# Patient Record
Sex: Male | Born: 1935
Health system: Southern US, Community
[De-identification: ages and names within clinical notes are randomized; demographics above are authoritative.]

## PROBLEM LIST (undated history)

## (undated) DIAGNOSIS — I2510 Atherosclerotic heart disease of native coronary artery without angina pectoris: Secondary | ICD-10-CM

## (undated) DIAGNOSIS — M109 Gout, unspecified: Secondary | ICD-10-CM

## (undated) DIAGNOSIS — E119 Type 2 diabetes mellitus without complications: Secondary | ICD-10-CM

## (undated) DIAGNOSIS — E785 Hyperlipidemia, unspecified: Secondary | ICD-10-CM

## (undated) DIAGNOSIS — I509 Heart failure, unspecified: Secondary | ICD-10-CM

## (undated) DIAGNOSIS — I10 Essential (primary) hypertension: Secondary | ICD-10-CM

## (undated) HISTORY — PX: BACK SURGERY: SHX140

## (undated) HISTORY — DX: Essential (primary) hypertension: I10

## (undated) HISTORY — DX: Gout, unspecified: M10.9

## (undated) HISTORY — DX: Atherosclerotic heart disease of native coronary artery without angina pectoris: I25.10

## (undated) HISTORY — DX: Morbid (severe) obesity due to excess calories: E66.01

## (undated) HISTORY — DX: Heart failure, unspecified: I50.9

## (undated) HISTORY — PX: PROSTATE SURGERY: SHX751

## (undated) HISTORY — DX: Hyperlipidemia, unspecified: E78.5

## (undated) HISTORY — DX: Type 2 diabetes mellitus without complications: E11.9

## (undated) HISTORY — PX: PERICARDIOCENTESIS: SHX2215

---

## 1997-08-15 ENCOUNTER — Ambulatory Visit (HOSPITAL_COMMUNITY): Admission: RE | Admit: 1997-08-15 | Discharge: 1997-08-15 | Payer: Self-pay | Admitting: Urology

## 1997-12-12 ENCOUNTER — Ambulatory Visit (HOSPITAL_COMMUNITY): Admission: RE | Admit: 1997-12-12 | Discharge: 1997-12-12 | Payer: Self-pay | Admitting: Urology

## 2000-12-26 ENCOUNTER — Encounter: Payer: Self-pay | Admitting: Cardiology

## 2000-12-26 ENCOUNTER — Ambulatory Visit (HOSPITAL_COMMUNITY): Admission: RE | Admit: 2000-12-26 | Discharge: 2000-12-27 | Payer: Self-pay | Admitting: Cardiology

## 2000-12-26 HISTORY — PX: CORONARY ANGIOPLASTY WITH STENT PLACEMENT: SHX49

## 2005-06-15 ENCOUNTER — Encounter (INDEPENDENT_AMBULATORY_CARE_PROVIDER_SITE_OTHER): Payer: Self-pay | Admitting: Specialist

## 2005-06-15 ENCOUNTER — Ambulatory Visit (HOSPITAL_COMMUNITY): Admission: RE | Admit: 2005-06-15 | Discharge: 2005-06-15 | Payer: Self-pay | Admitting: *Deleted

## 2006-04-14 ENCOUNTER — Encounter: Admission: RE | Admit: 2006-04-14 | Discharge: 2006-04-14 | Payer: Self-pay | Admitting: Internal Medicine

## 2007-06-09 ENCOUNTER — Ambulatory Visit (HOSPITAL_COMMUNITY): Admission: RE | Admit: 2007-06-09 | Discharge: 2007-06-09 | Payer: Self-pay | Admitting: Orthopedic Surgery

## 2007-06-14 ENCOUNTER — Encounter: Admission: RE | Admit: 2007-06-14 | Discharge: 2007-06-14 | Payer: Self-pay | Admitting: Orthopedic Surgery

## 2007-07-20 ENCOUNTER — Inpatient Hospital Stay (HOSPITAL_COMMUNITY): Admission: RE | Admit: 2007-07-20 | Discharge: 2007-07-22 | Payer: Self-pay | Admitting: Orthopedic Surgery

## 2010-08-31 HISTORY — PX: NM MYOVIEW LTD: HXRAD82

## 2010-08-31 HISTORY — PX: US ECHOCARDIOGRAPHY: HXRAD669

## 2010-09-29 NOTE — Op Note (Signed)
Roger Blackwell, Roger Blackwell NO.:  [id number]   MEDICAL RECORD NO.:  [id number]          PATIENT TYPE:  OIB   LOCATION:  0098                         FACILITY:  [facility]   PHYSICIAN:  Alvy Beal, MD    DATE OF BIRTH:  1935-06-07   DATE OF PROCEDURE:  07/20/2007  DATE OF DISCHARGE:                               OPERATIVE REPORT   PREOPERATIVE DIAGNOSIS:  Lumbar disk herniation, L5-S1 left side.   POSTOPERATIVE DIAGNOSIS:  Lumbar disk herniation, L5-S1 left side.   OPERATIVE PROCEDURE:  Lumbar laminectomy for diskectomy.   HISTORY:  This is a very pleasant 75 year old gentleman with diabetes,  heart disease, high blood pressure and morbid obesity.  He presents to  my office with complaints of severe left leg pain and moderate back.  Radiographic and clinical analysis confirmed the diagnosis of the disk  herniation.  After discussing treatment options including risks,  benefit, and alternatives to surgery.  He decided to proceed with  surgery because of the severity of the leg.   ASSISTANT:  Jene Every, M.D.   OPERATIVE NOTE:  The patient is brought to the operating room, placed  supine on the operating table.  After successful induction of general  anesthesia and endotracheal intubation, TEDs, SCDs and Foley were  applied and the patient was turned prone onto a Wilson frame.     Once the back was then prepped and draped in standard fashion after  ensuring that all bony prominences were well-padded.  A generous  posterior midline incision was then made.  Sharp dissection was carried  out down to and through the abundant adipose tissue to expose the deep  fascia.  The deep fascia was sharply incised and I stripped the  paraspinal muscles to expose the L5-S1 intervertebral space.  I placed a  Penfield 4 beneath the lamina of L5 and took an x-ray.  At this point I  realized that I was at the S1 lamina and so I relocated the Leonard 4,  repeated the x-ray,  confirmed the L5-S1 space.   At this point I used a high-speed bur to perform a laminotomy of L5 and  completed with a 2 and 3 mm Kerrison.  I then reflected the ligamentum  flavum off the insertion of the S1 lamina and then used 2 mm Kerrison to  resect some of the S1 lamina.  At this point I mobilized the ligamentum  flavum which I noted to be quite thickened.  I then used 2 and 3 mm  Kerrison to resect the ligamentum flavum and exposed the underlying  thecal sac.   At this point time the microscope was prepped and draped and brought  into the field, using the microscope, I was able to mobilize the thecal  sac and nerve root medially and expose the disk herniation.  There was a  disk herniation noted in the posterior lateral gutter right at the level  of the disk fragment similar to what was seen on preoperative MRI.  The  annulus was then incised with a 15 blade scalpel and I used a  combination of  pituitary rongeurs, curettes (Epstein curettes) to resect  the disk fragment.  Once I had resected the fragments of disk material,  I then removed the D'Errico retractor and then palpated superiorly,  medially, inferiorly along the lateral recess and out the neural  foramen.  There was no undue tension on the nerve roots, no evidence of  any retained fragments.  At this point my partner Dr. Shelle Iron repeated the  procedure in order to confirm that there was no retained fragments.  Once were both satisfied that we had completed diskectomy and we  obtained an adequate amount of disk consistent with what we saw on the  MRI, I irrigated the wound copiously with normal saline and used bipolar  electrocautery to obtain hemostasis and maintained it with a thrombin-  soaked Gelfoam patty placed over the laminotomy site.  I then closed the  deep fascia with interrupted #1 Vicryl sutures and I did a running #1  Vicryl sutures for the adipose tissue and a second 2-0 Vicryl to the  subcutaneous and a 3-0  Monocryl for the skin.  Steri-Strips and dry  dressing were applied.  The patient was extubated, transferred to PACU  without incident.  At the end of the case all needle and sponge counts  were correct.      Alvy Beal, MD  Electronically Signed     DDB/MEDQ  D:  07/20/2007  T:  07/20/2007  Job:  [id number]

## 2010-10-02 NOTE — Cardiovascular Report (Signed)
[place]  Patient:    Roger Blackwell, Roger Blackwell                      MRN: [id number] Proc. Date: 12/26/00 Adm. Date:  [id number] Attending:  Silvestre Mesi CC:         Soyla Murphy. Renne Crigler, M.D.  Cardiac Catheterization Lab   Cardiac Catheterization  REFERRING PHYSICIAN:  Soyla Murphy. Renne Crigler, M.D.  PROCEDURE PERFORMED BY:  Aram Candela. Aleen Campi, M.D.  PROCEDURES: 1. Left heart catheterization. 2. Coronary cineangiography. 3. Left ventricular cineangiography. 4. Abdominal aortogram. 5. Angioplasty with primary stenting of the proximal circumflex. 6. Perclose of the right femoral artery.  INDICATION FOR PROCEDURES:  This 75 year old male has a history of hypertension, non-insulin-dependent diabetes mellitus, tobacco abuse and recently complained of dyspnea on exertion.  He was sent for stress testing with a Persantine Cardiolite study, and this was positive for myocardial ischemia in the area of his inferolateral myocardium.  He was then scheduled for cardiac catheterization and possible angioplasty.  DESCRIPTION OF PROCEDURE:  After signing an informed consent, the patient was premedicated with 50 mg of Benadryl intravenously and brought to the cardiac catheterization lab.  His right groin was prepped and draped in sterile fashion, and anesthetized locally with 1% lidocaine.  A 6-French introducer sheath was inserted percutaneously into the right femoral artery.  Then 6-French #4 Judkins coronary catheters were used to make injections into the coronary arteries.  A 6-French pigtail catheter was used to measure pressures in the left ventricle and aorta, and to make midstream injections into the left ventricle and abdominal aorta.  After noting a severe to critical stenosis in his proximal circumflex coronary artery, which is a large dominant vessel, we explained this finding to the patient.  With his consent we proceeded with elective angioplasty  procedure.  We selected a 6-French CSL 4.0 guide catheter, which was advanced to the root of aorta.  After engaging this catheter tip in the ostium of the left coronary artery, a standard length Hi-Torque Floppy guide wire was inserted through the guide catheter and advanced into the left coronary artery.  After mild difficulty it was advanced into the circumflex and across the proximal lesion. After sizing the diameter and length of the lesion, we selected a 4.0 x 15 mm Penta multilink stent deployment system, which after proper preparation was inserted over the guide wire and positioned within the lesion.  The stent was deployed with two inflations; the first at 12 atm for 42 sec and the second at 15 atm for 36 sec.  After the stent was deployed the deployment was removed and further injections into the left coronary artery showed an excellent angiographic result, with 0% residual lesion and no evidence of dissection or clot.  There was normal antegrade flow.  The patient tolerated the procedure well and no complications were noted.  At the end of the procedure the catheter and sheath were removed from the right femoral artery, and hemostasis was easily obtained with a Perclose closure system.  MEDICATIONS GIVEN: 1. Heparin 6000 units IV. 2. Integrilin per pharmacy protocol.  HEMODYNAMIC DATA: 1. Left ventricular pressure:  185/0-16. 2. Aortic pressure:  185/87 with a mean of 124. 3. Left ventricular ejection fraction:  Estimated at approximately 55-60%.  CINEANGIOGRAPHIC FINDINGS: 1. LEFT CORONARY ARTERY:  The ostium and left main appear normal. 2. LEFT ANTERIOR DESCENDING ARTERY:  Has minor irregularities throughout the    proximal and middle segment,  but there was no significant stenotic lesion.    There was normal antegrade flow into the distal segment.  The distal    segment wrapped around the apex, supplying part of the posterior descending    circulation. 3. CIRCUMFLEX  CORONARY ARTERY:  The proximal segment has a minor plaque,    followed by a focal concentric 80% stenosis.  The remainder of the    circumflex appears normal, and is a large dominant vessel supplying the    posterolateral circulation in the proximal portion of the posterior    descending circulation.  The obtuse marginal branches appear normal. 4. RIGHT CORONARY ARTERY:  A small, nondominant vessel, which supplies the    right ventricular circulation.  It appears to be normal without significant    plaque.  LEFT VENTRICULAR CINEANGIOGRAM:  The left ventricular chamber size, contractility and wall thickness appeared normal.  There was no area of significant hypokinesia.  The ejection fraction was estimated at approximately 55%.  ABDOMINAL AORTOGRAM:  The abdominal aorta has mild irregular plaque in its distal segment.  There is normal flow and the renal arteries appeared normal. The left renal artery has a high takeoff and is moderately tortuous.  ANGIOPLASTY CINEANGIOGRAPHY: Cineangiographs taken during the angioplasty procedure shows proper position of the guide wire and balloon stent deployment system.  Final injections showed an excellent angiographic result, with 0% residual lesion and normal antegrade flow.  There was no evidence for dissection or clot.  FINAL DIAGNOSES: 1. Single-vessel coronary artery disease, with an 80% proximal    circumflex lesion. 2. Normal left ventricular function. 3. Normal mitral and aortic valves. 4. Mild irregular plaque, distal abdominal aorta. 5. Normal renal arteries. 6. Successful stent placement in the proximal circumflex lesion. 7. Successful Perclose of the right femoral artery.  DISPOSITION:  Will monitor on the EAU and anticipate discharge in the a.m. Will continue the Integrilin drip for 12 hr. DD:  12/26/00 TD:  12/26/00 Job: [id number]

## 2010-10-02 NOTE — Op Note (Signed)
[name], DOME NO.:  [id number]   MEDICAL RECORD NO.:  [id number]          PATIENT TYPE:  AMB   LOCATION:  ENDO                         FACILITY:  [facility]   PHYSICIAN:  Georgiana Spinner, M.D.    DATE OF BIRTH:  02-Jul-1935   DATE OF PROCEDURE:  06/15/2005  DATE OF DISCHARGE:                                 OPERATIVE REPORT   PROCEDURE:  Upper endoscopy.   INDICATIONS:  GERD.   ANESTHESIA:  Demerol 50, Versed 5 mg.   PROCEDURE:  With the patient mildly sedated in the left lateral decubitus  position, the Olympus videoscope endoscope was inserted in the mouth and  passed under direct vision through the esophagus which appeared normal into  the stomach, fundus, body and antrum. The duodenal bulb and second portion  of the duodenum were visualized from this point. The endoscope was slowly  withdrawn taking circumferential views at the line of mucosa until the  endoscope had been pulled back into the stomach and placed in retroflexion  to view the stomach from below. The endoscope was straightened and withdrawn  taking circumferential views of the gastric and esophageal mucosa. The  patient's vital signs and pulse oximeter remained stable. The patient  tolerated the procedure well with no apparent complications.   FINDINGS:  Unremarkable exam.   PLAN:  Proceed to colonoscopy.           ______________________________  Georgiana Spinner, M.D.     GMO/MEDQ  D:  06/15/2005  T:  06/15/2005  Job:  [id number]

## 2010-10-02 NOTE — Op Note (Signed)
[name], [name] NO.:  [id number]   MEDICAL RECORD NO.:  [id number]          PATIENT TYPE:  AMB   LOCATION:  ENDO                         FACILITY:  [facility]   PHYSICIAN:  Georgiana Spinner, M.D.    DATE OF BIRTH:  Aug 05, 1935   DATE OF PROCEDURE:  06/15/2005  DATE OF DISCHARGE:                                 OPERATIVE REPORT   PROCEDURE:  Colonoscopy.   INDICATIONS FOR PROCEDURE:  Colon polyps.   ANESTHESIA:  Demerol 20, Versed 2.5 mg.   PROCEDURE:  With the patient mildly sedated in the left lateral decubitus  position, the Olympus videoscopic colonoscope was inserted in the rectum and  passed under direct vision to the cecum identified by the ileocecal valve  and appendiceal orifice, both of which were photographed.  From this point,  the colonoscope was slowly withdrawn taking circumferential views of the  colonic mucosa stopping in the transverse colon distally where a small polyp  was seen, photographed, and removed using hot biopsy forceps technique at a  setting of 20/200 blend of current.  We then stopped in the rectum which  appeared normal on direct and showed hemorrhoidal tissue on retroflex view.  The endoscope was straightened and withdrawn.  The patient's vital signs and  pulse oximeter remained stable.  The patient tolerated the procedure well  without apparent complications.   FINDINGS:  Internal hemorrhoids, small polyp in the distal transverse colon,  await biopsy report.  The patient will call me for results and follow up  with me as an outpatient.           ______________________________  Georgiana Spinner, M.D.     GMO/MEDQ  D:  06/15/2005  T:  06/15/2005  Job:  [id number]

## 2011-02-05 LAB — URINALYSIS, ROUTINE W REFLEX MICROSCOPIC
Bilirubin Urine: NEGATIVE
Nitrite: NEGATIVE
Specific Gravity, Urine: 1.023
Urobilinogen, UA: 0.2

## 2011-02-05 LAB — BASIC METABOLIC PANEL
Calcium: 9.2
Creatinine, Ser: 1.22
GFR calc Af Amer: >60
GFR calc non Af Amer: 59 — ABNORMAL LOW
Glucose, Bld: 99
Sodium: 140

## 2011-02-08 LAB — BASIC METABOLIC PANEL
BUN: 24 — ABNORMAL HIGH
CO2: 27
GFR calc non Af Amer: 47 — ABNORMAL LOW
Glucose, Bld: 141 — ABNORMAL HIGH
Potassium: 4.2

## 2011-02-08 LAB — CBC
HCT: 36.8 — ABNORMAL LOW
MCHC: 33.1
MCV: 83.3
Platelets: 159
RDW: 14.9

## 2011-02-08 LAB — HEMOGLOBIN A1C: Hgb A1c MFr Bld: 8.1 — ABNORMAL HIGH

## 2011-09-17 ENCOUNTER — Other Ambulatory Visit: Payer: Self-pay | Admitting: Internal Medicine

## 2011-09-17 DIAGNOSIS — R0602 Shortness of breath: Secondary | ICD-10-CM

## 2012-04-10 ENCOUNTER — Other Ambulatory Visit: Payer: Self-pay | Admitting: Urology

## 2012-04-10 DIAGNOSIS — C61 Malignant neoplasm of prostate: Secondary | ICD-10-CM

## 2012-05-04 ENCOUNTER — Encounter (HOSPITAL_COMMUNITY)
Admission: RE | Admit: 2012-05-04 | Discharge: 2012-05-04 | Disposition: A | Discharge status: Home / Self Care | Payer: 59 | Source: Ambulatory Visit | Attending: Urology | Admitting: Urology

## 2012-05-04 DIAGNOSIS — C61 Malignant neoplasm of prostate: Secondary | ICD-10-CM

## 2012-05-04 MED ORDER — TECHNETIUM TC 99M MEDRONATE IV KIT
25.0000 | PACK | Freq: Once | INTRAVENOUS | Status: AC | PRN
  Administered 2012-05-04: 25 via INTRAVENOUS

## 2012-07-10 ENCOUNTER — Other Ambulatory Visit: Payer: Self-pay | Admitting: Urology

## 2012-07-10 DIAGNOSIS — C61 Malignant neoplasm of prostate: Secondary | ICD-10-CM

## 2012-08-28 ENCOUNTER — Encounter (HOSPITAL_COMMUNITY)
Admission: RE | Admit: 2012-08-28 | Discharge: 2012-08-28 | Disposition: A | Discharge status: Home / Self Care | Payer: 59 | Source: Ambulatory Visit | Attending: Urology | Admitting: Urology

## 2012-08-28 DIAGNOSIS — Z77090 Contact with and (suspected) exposure to asbestos: Secondary | ICD-10-CM | POA: Insufficient documentation

## 2012-08-28 DIAGNOSIS — R918 Other nonspecific abnormal finding of lung field: Secondary | ICD-10-CM | POA: Insufficient documentation

## 2012-08-28 DIAGNOSIS — R911 Solitary pulmonary nodule: Secondary | ICD-10-CM | POA: Insufficient documentation

## 2012-08-28 DIAGNOSIS — C61 Malignant neoplasm of prostate: Secondary | ICD-10-CM

## 2012-08-28 DIAGNOSIS — R599 Enlarged lymph nodes, unspecified: Secondary | ICD-10-CM | POA: Insufficient documentation

## 2012-08-28 MED ORDER — FLUDEOXYGLUCOSE F - 18 (FDG) INJECTION
11.7000 | Freq: Once | INTRAVENOUS | Status: AC | PRN
  Administered 2012-08-28: 11.7 via INTRAVENOUS

## 2013-05-19 ENCOUNTER — Encounter: Payer: Self-pay | Admitting: *Deleted

## 2013-05-21 ENCOUNTER — Encounter: Payer: Self-pay | Admitting: Cardiovascular Disease

## 2013-05-22 ENCOUNTER — Ambulatory Visit (INDEPENDENT_AMBULATORY_CARE_PROVIDER_SITE_OTHER): Payer: 59 | Admitting: Cardiovascular Disease

## 2013-05-22 ENCOUNTER — Encounter: Payer: Self-pay | Admitting: Cardiovascular Disease

## 2013-05-22 VITALS — BP 110/60 | HR 58 | Resp 20 | Ht 75.0 in | Wt 336.3 lb

## 2013-05-22 DIAGNOSIS — Z794 Long term (current) use of insulin: Secondary | ICD-10-CM

## 2013-05-22 DIAGNOSIS — E785 Hyperlipidemia, unspecified: Secondary | ICD-10-CM

## 2013-05-22 DIAGNOSIS — I10 Essential (primary) hypertension: Secondary | ICD-10-CM

## 2013-05-22 DIAGNOSIS — I5032 Chronic diastolic (congestive) heart failure: Secondary | ICD-10-CM

## 2013-05-22 DIAGNOSIS — H409 Unspecified glaucoma: Secondary | ICD-10-CM

## 2013-05-22 DIAGNOSIS — E119 Type 2 diabetes mellitus without complications: Secondary | ICD-10-CM

## 2013-05-22 DIAGNOSIS — I2510 Atherosclerotic heart disease of native coronary artery without angina pectoris: Secondary | ICD-10-CM

## 2013-05-22 MED ORDER — FUROSEMIDE 80 MG PO TABS: 80.0000 mg | ORAL_TABLET | Freq: Every day | ORAL | Status: DC

## 2013-05-22 NOTE — Patient Instructions (Signed)
Refills on your Furosemide have been sent to your pharmacy.  Your physician recommends that you schedule a follow-up appointment in: 6 months.

## 2013-05-25 ENCOUNTER — Encounter: Payer: Self-pay | Admitting: Cardiovascular Disease

## 2013-05-25 DIAGNOSIS — H409 Unspecified glaucoma: Secondary | ICD-10-CM | POA: Insufficient documentation

## 2013-05-25 DIAGNOSIS — E1122 Type 2 diabetes mellitus with diabetic chronic kidney disease: Secondary | ICD-10-CM | POA: Insufficient documentation

## 2013-05-25 DIAGNOSIS — I2510 Atherosclerotic heart disease of native coronary artery without angina pectoris: Secondary | ICD-10-CM | POA: Insufficient documentation

## 2013-05-25 DIAGNOSIS — E7800 Pure hypercholesterolemia, unspecified: Secondary | ICD-10-CM | POA: Insufficient documentation

## 2013-05-25 DIAGNOSIS — I5032 Chronic diastolic (congestive) heart failure: Secondary | ICD-10-CM | POA: Insufficient documentation

## 2013-05-25 DIAGNOSIS — Z794 Long term (current) use of insulin: Secondary | ICD-10-CM

## 2013-05-25 DIAGNOSIS — I10 Essential (primary) hypertension: Secondary | ICD-10-CM | POA: Insufficient documentation

## 2013-05-25 DIAGNOSIS — N183 Chronic kidney disease, stage 3 unspecified: Secondary | ICD-10-CM | POA: Insufficient documentation

## 2013-05-25 DIAGNOSIS — Z6836 Body mass index (BMI) 36.0-36.9, adult: Secondary | ICD-10-CM

## 2013-05-25 NOTE — Assessment & Plan Note (Signed)
Good control on the current regimen which includes beta blockers and angiotensin receptor blocker

## 2013-05-25 NOTE — Assessment & Plan Note (Signed)
His lipid profile in 2013 was good. I would request a more recent result from [place]

## 2013-05-25 NOTE — Progress Notes (Addendum)
Patient ID: Roger Blackwell, male   DOB: [birth year], 78 y.o.   MRN: [id number]     Reason for office visit Congestive heart failure with preserved left ventricular ejection fraction, hypertension   [name] is a morbidly obese 78 year old with diastolic heart failure. He has a history of a stent placed to the left circumflex coronary artery for an 80% stenosis in 2002 (Dr. Glade Lloyd). He has not had new coronary event since that time. A nuclear stress test performed in April of 2012 showed normal myocardial perfusion. He has long-standing hypertension and requires multiple agents for blood pressure control and this is likely the substrate for his heart failure. His ejection fraction by echocardiography is normal and there are no convincing signs of diastolic dysfunction but he responded well to diuretic therapy.  He does not take his furosemide every day because she does not want to urinate at night. He habitually takes his furosemide in the evenings. He becomes short of breath climbing one flight of stairs. Has some degree of calf swelling constantly. He denies chest pain but did have severe chest discomfort when he had "his heart attack" (as far as I can tell from the records he never had a myocardial infarction, but his coronary problems were picked up on a nuclear stress test. Blood pressure control has been good under Dr. Katrine Coho care. He also has diabetes mellitus and since switching to an insulin pump has had fewer problems with low blood sugar levels. He is on a potent statin.   Allergies  Allergen Reactions  . Ivp Dye [Iodinated Diagnostic Agents]     Current Outpatient Prescriptions  Medication Sig Dispense Refill  . Amlodipine-Valsartan-HCTZ (EXFORGE HCT) 10-320-25 MG TABS Take 1 tablet by mouth daily.      Marland Kitchen aspirin 81 MG tablet Take 81 mg by mouth daily.      . colchicine 0.6 MG tablet Take 0.6 mg by mouth daily.      . febuxostat (ULORIC) 40 MG tablet Take 80 mg by mouth  daily.      . furosemide (LASIX) 80 MG tablet Take 1 tablet (80 mg total) by mouth daily.  30 tablet  6  . insulin aspart (NOVOLOG) 100 UNIT/ML injection Inject 10-30 Units into the skin 3 (three) times daily before meals.      . insulin glargine (LANTUS) 100 UNIT/ML injection Inject 50-55 Units into the skin 2 (two) times daily.      . metoprolol (LOPRESSOR) 50 MG tablet Take 50 mg by mouth 2 (two) times daily.      . potassium chloride SA (K-DUR,KLOR-CON) 20 MEQ tablet Take 20 mEq by mouth daily.      . rosuvastatin (CRESTOR) 10 MG tablet Take 10 mg by mouth daily.      . Travoprost, BAK Free, (TRAVATAN) 0.004 % SOLN ophthalmic solution Place 1 drop into both eyes at bedtime.       No current facility-administered medications for this visit.    Past Medical History  Diagnosis Date  . Systemic hypertension   . DM (diabetes mellitus)   . Dyslipidemia   . Gout   . CAD (coronary artery disease)   . CHF (congestive heart failure)   . Morbid obesity     Past Surgical History  Procedure Laterality Date  . Prostate surgery    . Back surgery    . Pericardiocentesis    . US echocardiography  08/31/2010    EF 50-55%,RV mildly dilated,mild aortic root dilatation  .  Nm myoview ltd  08/31/2010    Normal  . Coronary angioplasty with stent placement  12/26/2000    80% prox. circumflex lesion w/successful stent placement.    Family History  Problem Relation Age of Onset  . Diabetes Brother   . Hypertension Sister     History   Social History  . Marital Status: Married    Spouse Name: N/A    Number of Children: N/A  . Years of Education: N/A   Occupational History  . Not on file.   Social History Main Topics  . Smoking status: Former Smoker    Quit date: 05/16/1993  . Smokeless tobacco: Not on file  . Alcohol Use: Yes     Comment: seldom  . Drug Use: No  . Sexual Activity: Not on file   Other Topics Concern  . Not on file   Social History Narrative  . No narrative on  file    Review of systems: He has dyspnea on exertion when climbing one flight of stairs (probably NYHA class II). He has chronic pretibial edema. The patient specifically denies any chest pain at rest or with exertion, dyspnea at rest, orthopnea, paroxysmal nocturnal dyspnea, syncope, palpitations, focal neurological deficits, intermittent claudication, lower extremity edema, unexplained weight gain, cough, hemoptysis or wheezing.  The patient also denies abdominal pain, nausea, vomiting, dysphagia, diarrhea, constipation, polyuria, polydipsia, dysuria, hematuria, frequency, urgency, abnormal bleeding or bruising, fever, chills, unexpected weight changes, mood swings, change in skin or hair texture, change in voice quality, auditory or visual problems, allergic reactions or rashes, new musculoskeletal complaints other than usual "aches and pains".   PHYSICAL EXAM BP 110/60  Pulse 58  Resp 20  Ht '6\' 3"'  (1.905 m)  Wt 336 lb 4.8 oz (152.545 kg)  BMI 42.03 kg/m2  General: Alert, oriented x3, no distress Head: no evidence of trauma, PERRL, EOMI, no exophtalmos or lid lag, no myxedema, no xanthelasma; normal ears, nose and oropharynx Neck: normal jugular venous pulsations and no hepatojugular reflux; brisk carotid pulses without delay and no carotid bruits Chest: clear to auscultation, no signs of consolidation by percussion or palpation, normal fremitus, symmetrical and full respiratory excursions Cardiovascular: normal position and quality of the apical impulse, regular rhythm, normal first and second heart sounds, no murmurs, rubs or gallops Abdomen: no tenderness or distention, no masses by palpation, no abnormal pulsatility or arterial bruits, normal bowel sounds, no hepatosplenomegaly Extremities: no clubbing, cyanosis; ventricle 1-2+ pretibial edema; 2+ radial, ulnar and brachial pulses bilaterally; 2+ right femoral, posterior tibial and dorsalis pedis pulses; 2+ left femoral, posterior  tibial and dorsalis pedis pulses; no subclavian or femoral bruits Neurological: grossly nonfocal   EKG: Sinus bradycardia, first degree AV block (PR 244 ms), no acute repolarization abnormality, inferior Q waves(old) are fairly sharp and nonspecific   BMET    Component Value Date/Time   NA 136 07/22/2007 0525   K 4.2 07/22/2007 0525   CL 102 07/22/2007 0525   CO2 27 07/22/2007 0525   GLUCOSE 141* 07/22/2007 0525   BUN 24* 07/22/2007 0525   CREATININE 1.48 07/22/2007 0525   CALCIUM 8.6 07/22/2007 0525   GFRNONAA 47* 07/22/2007 0525   GFRAA  Value: 57        The eGFR has been calculated using the MDRD equation. This calculation has not been validated in all clinical* 07/22/2007 0525     ASSESSMENT AND PLAN CAD s/p left circumflex coronary stents 2002 Currently angina free, although he apparently had angina in  the past. Fairly recent normal nuclear stress test. The suspicion for acute coronary insufficiency is low. Continue with risk factor treatment.  Morbid obesity In the long-run weight loss would be highly beneficial but he has been unsuccessful in his attempts so far  Chronic diastolic heart failure Interestingly when I first met him he had edema and shortness of breath and responded well to diuretic therapy, although his BNP level was low and his echocardiographic diastolic parameters did not really show overt elevated filling pressures. By physical exam today he is hypervolemic. I have advised him to take his diuretic on a daily basis as Dr. Shelia Media recommended. He should not take it before bedtime but a minimum of 6 hours before that to avoid urinating on a long period  Hyperlipidemia His lipid profile in 2013 was good. I would request a more recent result from [facility] medical  Severe hypertension Good control on the current regimen which includes beta blockers and angiotensin receptor blocker   Patient Instructions  Refills on your Furosemide have been sent to your pharmacy.  Your  physician recommends that you schedule a follow-up appointment in: 6 months.     Orders Placed This Encounter  Procedures  . EKG 12-Lead   Meds ordered this encounter  Medications  . furosemide (LASIX) 80 MG tablet    Sig: Take 1 tablet (80 mg total) by mouth daily.    Dispense:  30 tablet    Refill:  Defiance Lourdez Mcgahan, MD, [facility] [phone] office [phone] pager

## 2013-05-25 NOTE — Assessment & Plan Note (Signed)
Currently angina free, although he apparently had angina in the past. Fairly recent normal nuclear stress test. The suspicion for acute coronary insufficiency is low. Continue with risk factor treatment.

## 2013-05-25 NOTE — Assessment & Plan Note (Signed)
In the long-run weight loss would be highly beneficial but he has been unsuccessful in his attempts so far

## 2013-05-25 NOTE — Assessment & Plan Note (Signed)
Interestingly when I first met him he had edema and shortness of breath and responded well to diuretic therapy, although his BNP level was low and his echocardiographic diastolic parameters did not really show overt elevated filling pressures. By physical exam today he is hypervolemic. I have advised him to take his diuretic on a daily basis as Dr. Shelia Media recommended. He should not take it before bedtime but a minimum of 6 hours before that to avoid urinating on a long period

## 2013-12-17 ENCOUNTER — Ambulatory Visit (INDEPENDENT_AMBULATORY_CARE_PROVIDER_SITE_OTHER): Payer: 59 | Admitting: Cardiovascular Disease

## 2013-12-17 ENCOUNTER — Encounter: Payer: Self-pay | Admitting: Cardiovascular Disease

## 2013-12-17 VITALS — BP 124/62 | HR 54 | Resp 16 | Ht 75.0 in | Wt 335.6 lb

## 2013-12-17 DIAGNOSIS — I10 Essential (primary) hypertension: Secondary | ICD-10-CM

## 2013-12-17 DIAGNOSIS — I2510 Atherosclerotic heart disease of native coronary artery without angina pectoris: Secondary | ICD-10-CM

## 2013-12-17 DIAGNOSIS — E119 Type 2 diabetes mellitus without complications: Secondary | ICD-10-CM

## 2013-12-17 DIAGNOSIS — I5032 Chronic diastolic (congestive) heart failure: Secondary | ICD-10-CM

## 2013-12-17 DIAGNOSIS — Z794 Long term (current) use of insulin: Secondary | ICD-10-CM

## 2013-12-17 DIAGNOSIS — R0602 Shortness of breath: Secondary | ICD-10-CM

## 2013-12-17 DIAGNOSIS — E785 Hyperlipidemia, unspecified: Secondary | ICD-10-CM

## 2013-12-17 NOTE — Patient Instructions (Signed)
Your physician recommends that you schedule a follow-up appointment in: 1 Year

## 2013-12-17 NOTE — Progress Notes (Signed)
Patient ID: Roger Blackwell, male   DOB: November 05, 1935, 78 y.o.   MRN: [id number]     Reason for office visit  Congestive heart failure with preserved left ventricular ejection fraction, hypertension   Roger Blackwell is a morbidly obese 78 year old with diastolic heart failure. He has a history of a stent placed to the left circumflex coronary artery for an 80% stenosis in 2002 (Dr. Glade Lloyd). He has not had new coronary event since that time. A nuclear stress test performed in April of 2012 showed normal myocardial perfusion. He has long-standing hypertension and requires multiple agents for blood pressure control and this is likely the substrate for his heart failure. His ejection fraction by echocardiography is normal and there are no convincing signs of diastolic dysfunction but he responded well to diuretic therapy.   As before, he avoids taking furosemide since this makes him go to the bathroom too frequently. He has not had recent gout attacks. He does have persistent ankle edema which he doesn't seem to be bothered about. He only becomes short of breath when he has to walk up hill. He takes care of all the housework including vacuuming sweeping and mopping without any breathing problems. He denies chest pain. He gets occasionally dizzy, but this is a consistently associated with low glucose levels. His last hemoglobin A1c was 7.6%.    Allergies  Allergen Reactions  . Ivp Dye [Iodinated Diagnostic Agents]     Current Outpatient Prescriptions  Medication Sig Dispense Refill  . Amlodipine-Valsartan-HCTZ (EXFORGE HCT) 10-320-25 MG TABS Take 1 tablet by mouth daily.      Marland Kitchen aspirin 81 MG tablet Take 81 mg by mouth daily.      . colchicine 0.6 MG tablet Take 0.6 mg by mouth daily.      . febuxostat (ULORIC) 40 MG tablet Take 80 mg by mouth daily.      . furosemide (LASIX) 80 MG tablet Take 1 tablet (80 mg total) by mouth daily.  30 tablet  6  . insulin aspart (NOVOLOG) 100 UNIT/ML injection Inject  10-30 Units into the skin 3 (three) times daily before meals.      . insulin glargine (LANTUS) 100 UNIT/ML injection Inject 50-55 Units into the skin 2 (two) times daily.      . metoprolol (LOPRESSOR) 50 MG tablet Take 50 mg by mouth 2 (two) times daily.      . potassium chloride SA (K-DUR,KLOR-CON) 20 MEQ tablet Take 20 mEq by mouth daily.      . rosuvastatin (CRESTOR) 10 MG tablet Take 10 mg by mouth daily.      . Travoprost, BAK Free, (TRAVATAN) 0.004 % SOLN ophthalmic solution Place 1 drop into both eyes at bedtime.       No current facility-administered medications for this visit.    Past Medical History  Diagnosis Date  . Systemic hypertension   . DM (diabetes mellitus)   . Dyslipidemia   . Gout   . CAD (coronary artery disease)   . CHF (congestive heart failure)   . Morbid obesity     Past Surgical History  Procedure Laterality Date  . Prostate surgery    . Back surgery    . Pericardiocentesis    . US echocardiography  08/31/2010    EF 50-55%,RV mildly dilated,mild aortic root dilatation  . Nm myoview ltd  08/31/2010    Normal  . Coronary angioplasty with stent placement  12/26/2000    80% prox. circumflex lesion w/successful stent placement.  Family History  Problem Relation Age of Onset  . Diabetes Brother   . Hypertension Sister     History   Social History  . Marital Status: Married    Spouse Name: N/A    Number of Children: N/A  . Years of Education: N/A   Occupational History  . Not on file.   Social History Main Topics  . Smoking status: Former Smoker    Quit date: 05/16/1993  . Smokeless tobacco: Not on file  . Alcohol Use: Yes     Comment: seldom  . Drug Use: No  . Sexual Activity: Not on file   Other Topics Concern  . Not on file   Social History Narrative  . No narrative on file    Review of systems: He has persistent lower extremity edema and functional class II exertional dyspnea. The patient specifically denies any chest pain at  rest or with exertion, dyspnea at rest, orthopnea, paroxysmal nocturnal dyspnea, syncope, palpitations, focal neurological deficits, intermittent claudication,unexplained weight gain, cough, hemoptysis or wheezing.  The patient also denies abdominal pain, nausea, vomiting, dysphagia, diarrhea, constipation, polyuria, polydipsia, dysuria, hematuria, frequency, urgency, abnormal bleeding or bruising, fever, chills, unexpected weight changes, mood swings, change in skin or hair texture, change in voice quality, auditory or visual problems, allergic reactions or rashes, new musculoskeletal complaints other than usual "aches and pains".   PHYSICAL EXAM BP 124/62  Pulse 54  Resp 16  Ht 6\' 3"  (1.905 m)  Wt 335 lb 9.6 oz (152.227 kg)  BMI 41.95 kg/m2 General: Alert, oriented x3, no distress  Head: no evidence of trauma, PERRL, EOMI, no exophtalmos or lid lag, no myxedema, no xanthelasma; normal ears, nose and oropharynx  Neck: normal jugular venous pulsations and no hepatojugular reflux; brisk carotid pulses without delay and no carotid bruits  Chest: clear to auscultation, no signs of consolidation by percussion or palpation, normal fremitus, symmetrical and full respiratory excursions  Cardiovascular: normal position and quality of the apical impulse, regular rhythm, normal first and second heart sounds, no murmurs, rubs or gallops  Abdomen: no tenderness or distention, no masses by palpation, no abnormal pulsatility or arterial bruits, normal bowel sounds, no hepatosplenomegaly  Extremities: no clubbing, cyanosis; ventricle 1+ pedal and pretibial edema on the right, 2+ edema on the left; 2+ radial, ulnar and brachial pulses bilaterally; 2+ right femoral, posterior tibial and dorsalis pedis pulses; 2+ left femoral, posterior tibial and dorsalis pedis pulses; no subclavian or femoral bruits  Neurological: grossly nonfocal   EKG: Sinus bradycardia, first degree AV block (PR 254 ms), no acute  repolarization abnormality, inferior Q waves(old) are not seen on the current tracing  LABS  11/27/2013 creatinine 1.5, normal LFTs, potassium 4.5 Hemoglobin A1c 7.8% Total cholesterol 116, HDL 38, LDL 65, TG 66  ASSESSMENT AND PLAN  CAD s/p left circumflex coronary stents 2002  Currently angina free, although he apparently had angina in the past. Fairly recent normal nuclear stress test. Continue with risk factor treatment.   Morbid obesity  In the long-run weight loss would be highly beneficial but he has been unsuccessful in his attempts so far   Chronic diastolic heart failure   I have advised him to take his diuretic on a daily basis, but he is troubled by the frequent urination. At least he should take enough diuretic to prevent his leg edema from causing excessive skin stretching or blister formation. Dyspnea does not seem to be a big problem.  Hyperlipidemia and Type 2 DM  Excellent lipid profile except for borderline low HDL. A1c 7.8%.  Severe hypertension  Good control on the current regimen.  Orders Placed This Encounter  Procedures  . EKG 12-Lead   No orders of the defined types were placed in this encounter.    Holli Humbles, MD, [place] [phone] office [phone] pager

## 2014-04-24 ENCOUNTER — Encounter: Payer: Self-pay | Admitting: Cardiovascular Disease

## 2014-07-18 ENCOUNTER — Other Ambulatory Visit: Payer: Self-pay | Admitting: Urology

## 2014-07-18 DIAGNOSIS — C61 Malignant neoplasm of prostate: Secondary | ICD-10-CM

## 2014-07-29 ENCOUNTER — Ambulatory Visit (HOSPITAL_COMMUNITY)
Admission: RE | Admit: 2014-07-29 | Discharge: 2014-07-29 | Disposition: A | Discharge status: Home / Self Care | Payer: 59 | Source: Ambulatory Visit | Attending: Urology | Admitting: Urology

## 2014-07-29 ENCOUNTER — Encounter (HOSPITAL_COMMUNITY)
Admission: RE | Admit: 2014-07-29 | Discharge: 2014-07-29 | Disposition: A | Discharge status: Home / Self Care | Payer: 59 | Source: Ambulatory Visit | Attending: Urology | Admitting: Urology

## 2014-07-29 DIAGNOSIS — C61 Malignant neoplasm of prostate: Secondary | ICD-10-CM | POA: Diagnosis not present

## 2014-07-29 MED ORDER — TECHNETIUM TC 99M MEDRONATE IV KIT
26.9000 | PACK | Freq: Once | INTRAVENOUS | Status: AC | PRN
  Administered 2014-07-29: 26.9 via INTRAVENOUS

## 2014-10-02 ENCOUNTER — Telehealth: Payer: Self-pay | Admitting: Cardiovascular Disease

## 2014-10-02 NOTE — Telephone Encounter (Signed)
This was an outgoing call

## 2014-12-23 ENCOUNTER — Ambulatory Visit: Payer: Medicare Other | Admitting: Cardiovascular Disease

## 2014-12-30 ENCOUNTER — Encounter: Payer: Self-pay | Admitting: Cardiovascular Disease

## 2014-12-30 ENCOUNTER — Ambulatory Visit (INDEPENDENT_AMBULATORY_CARE_PROVIDER_SITE_OTHER): Payer: 59 | Admitting: Cardiovascular Disease

## 2014-12-30 VITALS — BP 114/60 | HR 53 | Resp 16 | Ht 75.0 in | Wt 335.0 lb

## 2014-12-30 DIAGNOSIS — E785 Hyperlipidemia, unspecified: Secondary | ICD-10-CM | POA: Diagnosis not present

## 2014-12-30 DIAGNOSIS — I2510 Atherosclerotic heart disease of native coronary artery without angina pectoris: Secondary | ICD-10-CM | POA: Diagnosis not present

## 2014-12-30 DIAGNOSIS — I5032 Chronic diastolic (congestive) heart failure: Secondary | ICD-10-CM | POA: Diagnosis not present

## 2014-12-30 DIAGNOSIS — I10 Essential (primary) hypertension: Secondary | ICD-10-CM | POA: Diagnosis not present

## 2014-12-30 NOTE — Patient Instructions (Signed)
Dr. Croitoru recommends that you schedule a follow-up appointment in: ONE YEAR   

## 2014-12-30 NOTE — Progress Notes (Signed)
Patient ID: Roger Blackwell, male   DOB: [birth year], 79 y.o.   MRN: [id number]     Cardiology Office Note   Date:  12/31/2014   ID:  Roger Blackwell, DOB 05-21-1935, MRN [id number]  PCP:  No primary care provider on file.  Cardiologist:   Roger Klein, MD   Chief Complaint  Patient presents with  . Follow-up    every now and then dizzy not that often  . Shortness of Breath    walking uphill  . Edema    both ankles     History of Present Illness: Roger Blackwell is a 79 y.o. male who presents for CAD status post remote PCI, diastolic heart failure and systemic hypertension  Very little has changed since his last appointment. Roger Blackwell is a morbidly obese 79 year old with diastolic heart failure. He has a history of a stent placed to the left circumflex coronary artery for an 80% stenosis in 2002 (Dr. Glade Lloyd). He has not had new coronary event since that time. A nuclear stress test performed in April of 2012 showed normal myocardial perfusion. He has long-standing hypertension and requires multiple agents for blood pressure control and this is likely the substrate for his heart failure. His ejection fraction by echocardiography is normal and there are no convincing signs of diastolic dysfunction but he responded well to diuretic therapy.   As before, he avoids taking furosemide since this makes him go to the bathroom too frequently. He only takes the diuretic roughly 3 times a week. He has not had recent gout attacks. He does have persistent ankle edema which he doesn't seem to be bothered about. He only becomes short of breath when he has to walk up hill. He takes care of all the housework including vacuuming sweeping and mopping without any breathing problems. He denies chest pain.   His endocrinologist recently checked his labs. These are not yet available for review.  Past Medical History  Diagnosis Date  . Systemic hypertension   . DM (diabetes mellitus)   . Dyslipidemia   .  Gout   . CAD (coronary artery disease)   . CHF (congestive heart failure)   . Morbid obesity     Past Surgical History  Procedure Laterality Date  . Prostate surgery    . Back surgery    . Pericardiocentesis    . US echocardiography  08/31/2010    EF 50-55%,RV mildly dilated,mild aortic root dilatation  . Nm myoview ltd  08/31/2010    Normal  . Coronary angioplasty with stent placement  12/26/2000    80% prox. circumflex lesion w/successful stent placement.     Current Outpatient Prescriptions  Medication Sig Dispense Refill  . Amlodipine-Valsartan-HCTZ (EXFORGE HCT) 10-320-25 MG TABS Take 1 tablet by mouth daily.    Marland Kitchen aspirin 81 MG tablet Take 81 mg by mouth daily.    . colchicine 0.6 MG tablet Take 0.6 mg by mouth daily.    . febuxostat (ULORIC) 40 MG tablet Take 80 mg by mouth daily.    . furosemide (LASIX) 80 MG tablet Take 1 tablet (80 mg total) by mouth daily. 30 tablet 6  . insulin aspart (NOVOLOG) 100 UNIT/ML injection Inject 10-30 Units into the skin 3 (three) times daily before meals.    . insulin glargine (LANTUS) 100 UNIT/ML injection Inject 50-55 Units into the skin 2 (two) times daily.    . metoprolol (LOPRESSOR) 50 MG tablet Take 50 mg by mouth 2 (two) times daily.    . potassium  chloride SA (K-DUR,KLOR-CON) 20 MEQ tablet Take 20 mEq by mouth daily.    . rosuvastatin (CRESTOR) 10 MG tablet Take 10 mg by mouth daily.    Nelva Nay SOLOSTAR 300 UNIT/ML SOPN Inject 20 Units as directed daily.  3  . Travoprost, BAK Free, (TRAVATAN) 0.004 % SOLN ophthalmic solution Place 1 drop into both eyes at bedtime.     No current facility-administered medications for this visit.    Allergies:   Ivp dye    Social History:  The patient  reports that he quit smoking about 21 years ago. He does not have any smokeless tobacco history on file. He reports that he drinks alcohol. He reports that he does not use illicit drugs.   Family History:  The patient's family history includes  Diabetes in his brother; Hypertension in his sister.    ROS:  Please see the history of present illness.    Otherwise, review of systems positive for none.   All other systems are reviewed and negative.    PHYSICAL EXAM: VS:  BP 114/60 mmHg  Pulse 53  Resp 16  Ht '6\' 3"'$  (1.905 m)  Wt 335 lb (151.955 kg)  BMI 41.87 kg/m2 , BMI Body mass index is 41.87 kg/(m^2).  General: Alert, oriented x3, no distress Head: no evidence of trauma, PERRL, EOMI, no exophtalmos or lid lag, no myxedema, no xanthelasma; normal ears, nose and oropharynx Neck: normal jugular venous pulsations and no hepatojugular reflux; brisk carotid pulses without delay and no carotid bruits Chest: clear to auscultation, no signs of consolidation by percussion or palpation, normal fremitus, symmetrical and full respiratory excursions Cardiovascular: normal position and quality of the apical impulse, regular rhythm, normal first and second heart sounds, no murmurs, rubs or gallops Abdomen: no tenderness or distention, no masses by palpation, no abnormal pulsatility or arterial bruits, normal bowel sounds, no hepatosplenomegaly Extremities: no clubbing, cyanosis; 2+ symmetrical bilateral pitting edema roughly 1/3 up the shins; 2+ radial, ulnar and brachial pulses bilaterally; 2+ right femoral, posterior tibial and dorsalis pedis pulses; 2+ left femoral, posterior tibial and dorsalis pedis pulses; no subclavian or femoral bruits Neurological: grossly nonfocal Psych: euthymic mood, full affect   EKG:  EKG is ordered today. The ekg ordered today demonstrates sinus bradycardia, otherwise normal   Recent Labs: No results found for requested labs within last 365 days.    Lipid Panel No results found for: CHOL, TRIG, HDL, CHOLHDL, VLDL, LDLCALC, LDLDIRECT    Wt Readings from Last 3 Encounters:  12/30/14 335 lb (151.955 kg)  12/17/13 335 lb 9.6 oz (152.227 kg)  05/22/13 336 lb 4.8 oz (152.545 kg)      ASSESSMENT AND  PLAN:  CAD s/p left circumflex coronary stents 2002  Currently angina free, although he apparently had angina in the past. Fairly recent normal nuclear stress test. Continue with risk factor treatment.   Morbid obesity  In the long-run weight loss would be highly beneficial but he has been unsuccessful in his attempts so far. He expresses no interest in a program of diet and exercise to lose weight.   Chronic diastolic heart failure  I have advised him to take his diuretic on a daily basis, but he is troubled by the frequent urination. At least he should take enough diuretic to prevent his leg edema from causing excessive skin stretching or blister formation. Dyspnea does not seem to be a big problem.  Hyperlipidemia and Type 2 DM Labs performed at [facility], nondilated available for  review  Severe hypertension  Excellent control on the current regimen.    Current medicines are reviewed at length with the patient today.  The patient does not have concerns regarding medicines.  The following changes have been made:  no change  Labs/ tests ordered today include:  Orders Placed This Encounter  Procedures  . EKG 12-Lead    Patient Instructions  Dr. Sallyanne Kuster recommends that you schedule a follow-up appointment in: [place].     Roger Spray, MD  12/31/2014 4:04 PM    Roger Klein, MD, [facility] [phone] office [phone] pager

## 2015-12-03 ENCOUNTER — Other Ambulatory Visit: Payer: Self-pay | Admitting: Urology

## 2015-12-03 DIAGNOSIS — C61 Malignant neoplasm of prostate: Secondary | ICD-10-CM

## 2015-12-11 ENCOUNTER — Encounter (HOSPITAL_COMMUNITY)
Admission: RE | Admit: 2015-12-11 | Discharge: 2015-12-11 | Disposition: A | Discharge status: Home / Self Care | Payer: 59 | Source: Ambulatory Visit | Attending: Urology | Admitting: Urology

## 2015-12-11 DIAGNOSIS — C61 Malignant neoplasm of prostate: Secondary | ICD-10-CM | POA: Diagnosis present

## 2015-12-11 MED ORDER — TECHNETIUM TC 99M MEDRONATE IV KIT
25.0000 | PACK | Freq: Once | INTRAVENOUS | Status: AC | PRN
  Administered 2015-12-11: 24.6 via INTRAVENOUS

## 2016-01-01 ENCOUNTER — Encounter (INDEPENDENT_AMBULATORY_CARE_PROVIDER_SITE_OTHER): Payer: Self-pay

## 2016-01-01 ENCOUNTER — Ambulatory Visit (INDEPENDENT_AMBULATORY_CARE_PROVIDER_SITE_OTHER): Payer: 59 | Admitting: Cardiovascular Disease

## 2016-01-01 ENCOUNTER — Encounter: Payer: Self-pay | Admitting: Cardiovascular Disease

## 2016-01-01 VITALS — BP 90/58 | HR 51 | Ht 75.0 in | Wt 323.0 lb

## 2016-01-01 DIAGNOSIS — E119 Type 2 diabetes mellitus without complications: Secondary | ICD-10-CM

## 2016-01-01 DIAGNOSIS — I5032 Chronic diastolic (congestive) heart failure: Secondary | ICD-10-CM

## 2016-01-01 DIAGNOSIS — Z794 Long term (current) use of insulin: Secondary | ICD-10-CM

## 2016-01-01 DIAGNOSIS — I2510 Atherosclerotic heart disease of native coronary artery without angina pectoris: Secondary | ICD-10-CM

## 2016-01-01 DIAGNOSIS — E785 Hyperlipidemia, unspecified: Secondary | ICD-10-CM | POA: Diagnosis not present

## 2016-01-01 DIAGNOSIS — I10 Essential (primary) hypertension: Secondary | ICD-10-CM | POA: Diagnosis not present

## 2016-01-01 MED ORDER — METOPROLOL TARTRATE 25 MG PO TABS: 25.0000 mg | ORAL_TABLET | Freq: Two times a day (BID) | ORAL | 5 refills | Status: DC

## 2016-01-01 NOTE — Progress Notes (Signed)
Cardiology Office Note    Date:  01/01/2016   ID:  Roger Blackwell, DOB 01-11-1936, MRN [id number]  PCP:  Horatio Pel, MD  Cardiologist:   Sanda Klein, MD   Chief Complaint  Patient presents with  . Follow-up    yearly  . Shortness of Breath  . Edema    History of Present Illness:  Roger Blackwell is a 80 y.o. male with CAD status post remote PCI, diastolic heart failure and systemic hypertension.  Presents today with mild bradycardia and mild hypotension, but does not describe any problems with dizziness lightheadedness or syncope.  He is still able to perform all his usual housekeeping duties including vacuuming and sweeping without any dyspnea or weakness. He no longer mows his own lawn. He continues a problems with leg edema but denies significant exertional dyspnea. He only becomes short of breath if he has to walk uphill. He does not like to take diuretics since he has frequent urination anyway. He only takes furosemide about twice a week. He gets up 3 or 4 times a night to urinate. He has a remote history of prostate cancer, but recently his PSA has increased up to 13. He is scheduled to see Dr. Jeffie Pollock tomorrow.  He has a history of a stent placed to the left circumflex coronary artery for an 80% stenosis in 2002 (Dr. Glade Lloyd). He has not had new coronary event since that time. A nuclear stress test performed in April of 2012 showed normal myocardial perfusion. He has long-standing hypertension and requires multiple agents for blood pressure control and this is likely the substrate for his heart failure. His ejection fraction by echocardiography is normal and there are no convincing signs of diastolic dysfunction but he responded well to diuretic therapy.     Past Medical History:  Diagnosis Date  . CAD (coronary artery disease)   . CHF (congestive heart failure) ([place])   . DM (diabetes mellitus) ([place])   . Dyslipidemia   . Gout   . Morbid obesity ([place])   .  Systemic hypertension     Past Surgical History:  Procedure Laterality Date  . BACK SURGERY    . CORONARY ANGIOPLASTY WITH STENT PLACEMENT  12/26/2000   80% prox. circumflex lesion w/successful stent placement.  Marland Kitchen NM MYOVIEW LTD  08/31/2010   Normal  . PERICARDIOCENTESIS    . PROSTATE SURGERY    . US ECHOCARDIOGRAPHY  08/31/2010   EF 50-55%,RV mildly dilated,mild aortic root dilatation    Current Medications: Outpatient Medications Prior to Visit  Medication Sig Dispense Refill  . Amlodipine-Valsartan-HCTZ (EXFORGE HCT) 10-320-25 MG TABS Take 1 tablet by mouth daily.    Marland Kitchen aspirin 81 MG tablet Take 81 mg by mouth daily.    . colchicine 0.6 MG tablet Take 0.6 mg by mouth daily.    . febuxostat (ULORIC) 40 MG tablet Take 80 mg by mouth daily.    . furosemide (LASIX) 80 MG tablet Take 1 tablet (80 mg total) by mouth daily. 30 tablet 6  . insulin aspart (NOVOLOG) 100 UNIT/ML injection Inject 10-30 Units into the skin 3 (three) times daily before meals.    . insulin glargine (LANTUS) 100 UNIT/ML injection Inject 50-55 Units into the skin 2 (two) times daily.    . metoprolol (LOPRESSOR) 50 MG tablet Take 50 mg by mouth 2 (two) times daily.    . potassium chloride SA (K-DUR,KLOR-CON) 20 MEQ tablet Take 20 mEq by mouth daily.    . rosuvastatin (CRESTOR) 10  MG tablet Take 10 mg by mouth daily.    Nelva Nay SOLOSTAR 300 UNIT/ML SOPN Inject 20 Units as directed daily.  3  . Travoprost, BAK Free, (TRAVATAN) 0.004 % SOLN ophthalmic solution Place 1 drop into both eyes at bedtime.     No facility-administered medications prior to visit.      Allergies:   Ivp dye [iodinated diagnostic agents]   Social History   Social History  . Marital status: Married    Spouse name: N/A  . Number of children: N/A  . Years of education: N/A   Social History Main Topics  . Smoking status: Former Smoker    Quit date: 05/16/1993  . Smokeless tobacco: Never Used  . Alcohol use Yes     Comment: seldom  .  Drug use: No  . Sexual activity: Not Asked   Other Topics Concern  . None   Social History Narrative  . None     Family History:  The patient's family history includes Diabetes in his brother; Hypertension in his sister.   ROS:   Please see the history of present illness.    ROS All other systems reviewed and are negative.   PHYSICAL EXAM:   VS:  BP (!) 90/58   Pulse (!) 51   Ht '6\' 3"'$  (1.905 m)   Wt (!) 323 lb (146.5 kg)   BMI 40.37 kg/m    GEN: Morbidly obese, well developed, in no acute distress  HEENT: normal  Neck: no JVD, carotid bruits, or masses Cardiac: Bradycardia, RRR; no murmurs, rubs, or gallops, symmetrical 3+ bilateral ankle edema  Respiratory:  clear to auscultation bilaterally, normal work of breathing GI: soft, nontender, nondistended, + BS MS: no deformity or atrophy  Skin: warm and dry, no rash Neuro:  Alert and Oriented x 3, Strength and sensation are intact Psych: euthymic mood, full affect  Wt Readings from Last 3 Encounters:  01/01/16 (!) 323 lb (146.5 kg)  12/30/14 (!) 335 lb (152 kg)  12/17/13 (!) 335 lb 9.6 oz (152.2 kg)      Studies/Labs Reviewed:   EKG:  EKG is ordered today.  The ekg ordered today demonstrates Sinus bradycardia, otherwise normal. QTC 436 ms    ASSESSMENT:    1. Chronic diastolic heart failure ([place])   2. Coronary artery disease involving native coronary artery of native heart without angina pectoris   3. Severe hypertension   4. Hyperlipidemia   5. Diabetes mellitus type 2, insulin dependent ([place])   6. Morbid obesity due to excess calories ([place])      PLAN:  In order of problems listed above:  1. CHF: As always he has lower extremity edema, but no other signs of hypervolemia and no complaints of excessive dyspnea. Will continue to let him adjust his own diuretic dose. 2. CAD: Asymptomatic. No need for revascularization in over 15 years. 3. HTN: He has a history of long-standing severe hypertension which is  probably the substrate for his heart failure. Today's blood pressure is quite low and he is also bradycardic. We'll reduce his beta blocker dose in half and have him come back in a week. If necessary we can decrease the beta blocker dose further. However, I have told him we always "wean" this medication rather than stop it abruptly. 4. HLP: On statin. He reports that Dr. Shelia Media was satisfied with his recent lipid profile. We will request a copy. Target LDL cholesterol under 100 mg/dL, preferably under 70 mg/dL. 5. DM: He  sees his diabetic specialist in October. He reports good glycemic control. 6. Obesity: Underlies all of his health problems. He has actually lost substantial weight since last year (which might explain his lower blood pressure), but remains in the morbidly obese range.    Medication Adjustments/Labs and Tests Ordered: Current medicines are reviewed at length with the patient today.  Concerns regarding medicines are outlined above.  Medication changes, Labs and Tests ordered today are listed in the Patient Instructions below. There are no Patient Instructions on file for this visit.   Signed, Sanda Klein, MD  01/01/2016 9:28 AM    [facility] [place], [place],   [address] Phone: [phone]; Fax: [phone]

## 2016-01-01 NOTE — Patient Instructions (Signed)
Dr Sallyanne Kuster has recommended making the following medication changes: 1. DECREASE Metoprolol to 25 mg twice daily  Your physician recommends that you schedule a follow-up appointment in 1 week with one of our pharmacists, Erasmo Downer or Georgina Peer for medication management.  Dr Sallyanne Kuster recommends that you schedule a follow-up appointment in 12 months. You will receive a reminder letter in the mail two months in advance. If you don't receive a letter, please call our office to schedule the follow-up appointment.  If you need a refill on your cardiac medications before your next appointment, please call your pharmacy.

## 2016-01-08 ENCOUNTER — Ambulatory Visit (INDEPENDENT_AMBULATORY_CARE_PROVIDER_SITE_OTHER): Payer: 59 | Admitting: Pharmacist

## 2016-01-08 VITALS — BP 96/58 | HR 50 | Ht 75.0 in | Wt 325.2 lb

## 2016-01-08 DIAGNOSIS — I5032 Chronic diastolic (congestive) heart failure: Secondary | ICD-10-CM

## 2016-01-08 DIAGNOSIS — I10 Essential (primary) hypertension: Secondary | ICD-10-CM | POA: Diagnosis not present

## 2016-01-08 DIAGNOSIS — I2510 Atherosclerotic heart disease of native coronary artery without angina pectoris: Secondary | ICD-10-CM

## 2016-01-08 NOTE — Progress Notes (Signed)
Agree. Next step would be to cut back his amlodipine dose if BP still low.

## 2016-01-08 NOTE — Patient Instructions (Addendum)
Return for a a follow up appointment in 4 weeks  Check your blood pressure at home daily (if able) and keep record of the readings.  Take your BP meds as follows: Stop metoprolol completely   Bring all of your meds, your BP cuff and your record of home blood pressures to your next appointment.  Exercise as you're able, try to walk approximately 30 minutes per day.  Keep salt intake to a minimum, especially watch canned and prepared boxed foods.  Eat more fresh fruits and vegetables and fewer canned items.  Avoid eating in fast food restaurants.    HOW TO TAKE YOUR BLOOD PRESSURE: . Rest 5 minutes before taking your blood pressure. .  Don't smoke or drink caffeinated beverages for at least 30 minutes before. . Take your blood pressure before (not after) you eat. . Sit comfortably with your back supported and both feet on the floor (don't cross your legs). . Elevate your arm to heart level on a table or a desk. . Use the proper sized cuff. It should fit smoothly and snugly around your bare upper arm. There should be enough room to slip a fingertip under the cuff. The bottom edge of the cuff should be 1 inch above the crease of the elbow. . Ideally, take 3 measurements at one sitting and record the average.

## 2016-01-08 NOTE — Progress Notes (Signed)
Patient ID: Roger Blackwell                 DOB: [birth year]                      MRN: [id number]     HPI: Roger Blackwell is a 80 y.o. male patient of Dr. Sallyanne Kuster with PMH below who presents today for hypertension evaluation. He recently saw Dr. Loletha Grayer where he was found to be severely hypotensive and bradycardic (possibly secondary to weight loss). Dr. Loletha Grayer reduced his beta blocker dose.   Since that visit he states he still has some orthostatic hypotension. He is unable to stand for any length of time due to feeling dizzy. He also states he does get dizzy when standing.   Cardiac Hx: CAD, CHF, HTN, DM2, HLD  Current HTN meds:  Amlodipine 10/valsartan 320/HCTZ 25 in the morning Furosemide '80mg'$  prn swelling (in our chart as daily but he states he only takes it when he feels swollen) Metoprolol tartrate '25mg'$  once daily (also in our chart as BID but he states he was told to take once daily)  Family History: Father passed away from "old age" and his mother passed from cancer. He has 2 brothers that both died from lung cancer and his sister passed from an aneurism.   Social History: He quit smoking in 1994. He drinks alcohol only occasionally when in social settings.   Diet: He eats most of his meals from restaurants right now because wife just had a knee replacement and is unable to cook. He rarely adds salt at the table. He drinks coffee only occasionally in the winter, but endorses 3-4 glasses of soda per day.   Exercise: His exercise is limited due to SOB. He states he is fine to walk short distances on flat land, but struggles to go any distance on hills.   Home BP readings: he has a cuff that he believes does not work any longer.   Wt Readings from Last 3 Encounters:  01/01/16 (!) 323 lb (146.5 kg)  12/30/14 (!) 335 lb (152 kg)  12/17/13 (!) 335 lb 9.6 oz (152.2 kg)   BP Readings from Last 3 Encounters:  01/01/16 (!) 90/58  12/30/14 114/60  12/17/13 124/62   Pulse Readings from Last 3  Encounters:  01/01/16 (!) 51  12/30/14 (!) 53  12/17/13 (!) 54    Renal function: CrCl cannot be calculated (Patient's most recent lab result is older than the maximum 21 days allowed.).  Past Medical History:  Diagnosis Date  . CAD (coronary artery disease)   . CHF (congestive heart failure) ([place])   . DM (diabetes mellitus) ([place])   . Dyslipidemia   . Gout   . Morbid obesity ([place])   . Systemic hypertension     Current Outpatient Prescriptions on File Prior to Visit  Medication Sig Dispense Refill  . Amlodipine-Valsartan-HCTZ (EXFORGE HCT) 10-320-25 MG TABS Take 1 tablet by mouth daily.    Marland Kitchen aspirin 81 MG tablet Take 81 mg by mouth daily.    . colchicine 0.6 MG tablet Take 0.6 mg by mouth daily.    . febuxostat (ULORIC) 40 MG tablet Take 80 mg by mouth daily.    . furosemide (LASIX) 80 MG tablet Take 1 tablet (80 mg total) by mouth daily. 30 tablet 6  . insulin aspart (NOVOLOG) 100 UNIT/ML injection Inject 10-30 Units into the skin 3 (three) times daily before meals.    . insulin glargine (  LANTUS) 100 UNIT/ML injection Inject 50-55 Units into the skin 2 (two) times daily.    . metoprolol (LOPRESSOR) 25 MG tablet Take 1 tablet (25 mg total) by mouth 2 (two) times daily. 60 tablet 5  . potassium chloride SA (K-DUR,KLOR-CON) 20 MEQ tablet Take 20 mEq by mouth daily.    . rosuvastatin (CRESTOR) 10 MG tablet Take 10 mg by mouth daily.    Nelva Nay SOLOSTAR 300 UNIT/ML SOPN Inject 20 Units as directed daily.  3  . Travoprost, BAK Free, (TRAVATAN) 0.004 % SOLN ophthalmic solution Place 1 drop into both eyes at bedtime.     No current facility-administered medications on file prior to visit.     Allergies  Allergen Reactions  . Ivp Dye [Iodinated Diagnostic Agents]     There were no vitals taken for this visit.   Assessment/Plan: Hypertension/CHF: BP today remains on the low side and his HR is again low. He is still experiencing symptoms of hypotension. Since he self tapered his  dose of metoprolol down even further than Dr. Lurline Del recommendation will discontinue for now. Given his history of HF we would like him to be on a low dose of BB. We will see how his HR rebounds after d/c of BB and consider adding low dose of metoprolol back at next visit. His other BP medications may need to be reduced to allow addition of BB without causing hypotension. Have asked he purchase new cuff and record blood pressures and HR over next 4 weeks and bring to f/u visit.    Thank you, Lelan Pons. Patterson Hammersmith, [facility]  01/08/2016 8:29 AM

## 2016-02-05 ENCOUNTER — Ambulatory Visit (INDEPENDENT_AMBULATORY_CARE_PROVIDER_SITE_OTHER): Payer: 59 | Admitting: Pharmacist

## 2016-02-05 VITALS — BP 128/68 | HR 69

## 2016-02-05 DIAGNOSIS — I10 Essential (primary) hypertension: Secondary | ICD-10-CM | POA: Diagnosis not present

## 2016-02-05 DIAGNOSIS — I2510 Atherosclerotic heart disease of native coronary artery without angina pectoris: Secondary | ICD-10-CM

## 2016-02-05 NOTE — Patient Instructions (Addendum)
Check your blood pressure at home daily (if able) and keep record of the readings.  Take your BP meds as follows: Continue your current medications  Continue to check your blood pressure, if you notice the pressure trending above 140/90 please call the clinic. Also call if you becoming increasingly dizzy again.   Bring all of your meds, your BP cuff and your record of home blood pressures to your next appointment.  Exercise as you're able, try to walk approximately 30 minutes per day.  Keep salt intake to a minimum, especially watch canned and prepared boxed foods.  Eat more fresh fruits and vegetables and fewer canned items.  Avoid eating in fast food restaurants.    HOW TO TAKE YOUR BLOOD PRESSURE: . Rest 5 minutes before taking your blood pressure. .  Don't smoke or drink caffeinated beverages for at least 30 minutes before. . Take your blood pressure before (not after) you eat. . Sit comfortably with your back supported and both feet on the floor (don't cross your legs). . Elevate your arm to heart level on a table or a desk. . Use the proper sized cuff. It should fit smoothly and snugly around your bare upper arm. There should be enough room to slip a fingertip under the cuff. The bottom edge of the cuff should be 1 inch above the crease of the elbow. . Ideally, take 3 measurements at one sitting and record the average.

## 2016-02-05 NOTE — Progress Notes (Signed)
Patient ID: Roger Blackwell                 DOB: May 16, 1936                      MRN: [id number]     HPI: Yecheskel Kurek is a 80 y.o. male patient of Dr. Sallyanne Kuster with PMH below who presents today for hypotensive follow up. At his most recent visit his metoprolol was discontinued due to low HR and BP.   He returns today feeling much better. He does report that he had a few headaches the first day or two he came off metoprolol but these have since ceased. He is also not certain if this headaches may have been due to dietary indiscretion. He reports he still occasionally has some dizziness, but this has also dramatically improved since our last visit.   He recently bought a new BP cuff and states his pressures have mostly been in 130s/60-70s. He has not yet set up the memory. His cuff measures appropriately today in office.    Cardiac Hx: CAD, CHF, HTN, DM2, HLD  Current HTN meds:  Amlodipine 10/valsartan 320/HCTZ 25 qam Furosemide '80mg'$  - prn swelling  Previously tried: metoprolol - low HR  Family History: Father passed away from "old age" and his mother passed from cancer. He has 2 brothers that both died from lung cancer and his sister passed from an aneurism.   Social History: He quit smoking in 1994. He drinks alcohol only occasionally when in social settings.   Diet: He eats most of his meals from restaurants right now because wife just had a knee replacement and is unable to cook. He rarely adds salt at the table. He drinks coffee only occasionally in the winter, but endorses 3-4 glasses of soda per day.   Exercise: His exercise is limited due to SOB. He states he is fine to walk short distances on flat land, but struggles to go any distance on hills.  Home BP readings: see above  Wt Readings from Last 3 Encounters:  01/08/16 (!) 325 lb 3.2 oz (147.5 kg)  01/01/16 (!) 323 lb (146.5 kg)  12/30/14 (!) 335 lb (152 kg)   BP Readings from Last 3 Encounters:  02/05/16 128/68    01/08/16 (!) 96/58  01/01/16 (!) 90/58   Pulse Readings from Last 3 Encounters:  02/05/16 69  01/08/16 (!) 50  01/01/16 (!) 51    Renal function: CrCl cannot be calculated (Unknown ideal weight.).  Past Medical History:  Diagnosis Date  . CAD (coronary artery disease)   . CHF (congestive heart failure) ([place])   . DM (diabetes mellitus) ([place])   . Dyslipidemia   . Gout   . Morbid obesity ([place])   . Systemic hypertension     Current Outpatient Prescriptions on File Prior to Visit  Medication Sig Dispense Refill  . Amlodipine-Valsartan-HCTZ (EXFORGE HCT) 10-320-25 MG TABS Take 1 tablet by mouth daily.    Marland Kitchen aspirin 81 MG tablet Take 81 mg by mouth daily.    . colchicine 0.6 MG tablet Take 0.6 mg by mouth daily as needed.     . febuxostat (ULORIC) 40 MG tablet Take 80 mg by mouth daily.    . furosemide (LASIX) 80 MG tablet Take 1 tablet (80 mg total) by mouth daily. 30 tablet 6  . insulin aspart (NOVOLOG) 100 UNIT/ML injection Inject 8 Units into the skin 3 (three) times daily before meals.     Marland Kitchen  potassium chloride SA (K-DUR,KLOR-CON) 20 MEQ tablet Take 20 mEq by mouth daily.    . rosuvastatin (CRESTOR) 10 MG tablet Take 10 mg by mouth daily.    . Travoprost, BAK Free, (TRAVATAN) 0.004 % SOLN ophthalmic solution Place 1 drop into both eyes at bedtime.     No current facility-administered medications on file prior to visit.     Allergies  Allergen Reactions  . Ivp Dye [Iodinated Diagnostic Agents]     Blood pressure 128/68, pulse 69.   Assessment/Plan: Hypotension: BP and HR are improved from last visit. As stated previously it would be ideal for him to be on at least a low dose of beta blocker due to HF, but his HR does not appear to tolerate at this time. Will have him continue his current regimen for BP control. Follow up with Dr. Sallyanne Kuster as scheduled and hypertension clinic as needed for dizziness or pressures trending >140/90.    Thank you, Lelan Pons. Patterson Hammersmith, [facility]  02/05/2016 10:55 AM

## 2016-06-11 ENCOUNTER — Telehealth: Payer: Self-pay | Admitting: Hematology

## 2016-06-11 ENCOUNTER — Encounter: Payer: Self-pay | Admitting: Hematology

## 2016-06-11 NOTE — Telephone Encounter (Signed)
[name] my call to schedule an appt. Appt made for him to see Dr. Irene Limbo on 2/12 at 11am. Pt agreed ot the appt date and time. Letter mailed.

## 2016-06-28 ENCOUNTER — Ambulatory Visit (HOSPITAL_BASED_OUTPATIENT_CLINIC_OR_DEPARTMENT_OTHER): Payer: 59

## 2016-06-28 ENCOUNTER — Ambulatory Visit (HOSPITAL_BASED_OUTPATIENT_CLINIC_OR_DEPARTMENT_OTHER): Payer: 59 | Admitting: Hematology

## 2016-06-28 ENCOUNTER — Encounter: Payer: Self-pay | Admitting: Hematology

## 2016-06-28 VITALS — BP 136/51 | HR 61 | Temp 98.0°F | Resp 18 | Ht 75.0 in | Wt 321.5 lb

## 2016-06-28 DIAGNOSIS — D649 Anemia, unspecified: Secondary | ICD-10-CM

## 2016-06-28 DIAGNOSIS — N183 Chronic kidney disease, stage 3 (moderate): Secondary | ICD-10-CM | POA: Diagnosis not present

## 2016-06-28 LAB — CBC & DIFF AND RETIC
BASO%: 0.2 % (ref 0.0–2.0)
Basophils Absolute: 0 10*3/uL (ref 0.0–0.1)
EOS%: 3.8 % (ref 0.0–7.0)
Eosinophils Absolute: 0.3 10*3/uL (ref 0.0–0.5)
HCT: 31.9 % — ABNORMAL LOW (ref 38.4–49.9)
HGB: 10.2 g/dL — ABNORMAL LOW (ref 13.0–17.1)
IMMATURE RETIC FRACT: 4.7 % (ref 3.00–10.60)
LYMPH#: 2 10*3/uL (ref 0.9–3.3)
LYMPH%: 24.4 % (ref 14.0–49.0)
MCH: 25.8 pg — ABNORMAL LOW (ref 27.2–33.4)
MCHC: 32 g/dL (ref 32.0–36.0)
MCV: 80.8 fL (ref 79.3–98.0)
MONO#: 0.6 10*3/uL (ref 0.1–0.9)
MONO%: 6.7 % (ref 0.0–14.0)
NEUT%: 64.9 % (ref 39.0–75.0)
NEUTROS ABS: 5.4 10*3/uL (ref 1.5–6.5)
Platelets: 172 10*3/uL (ref 140–400)
RBC: 3.95 10*6/uL — ABNORMAL LOW (ref 4.20–5.82)
RDW: 14.6 % (ref 11.0–14.6)
RETIC CT ABS: 31.6 10*3/uL — AB (ref 34.80–93.90)
Retic %: 0.8 % (ref 0.80–1.80)
WBC: 8.3 10*3/uL (ref 4.0–10.3)

## 2016-06-28 LAB — IRON AND TIBC
%SAT: 17 % — AB (ref 20–55)
Iron: 49 ug/dL (ref 42–163)
TIBC: 295 ug/dL (ref 202–409)
UIBC: 246 ug/dL (ref 117–376)

## 2016-06-28 LAB — COMPREHENSIVE METABOLIC PANEL
ALT: 16 U/L (ref 0–55)
AST: 18 U/L (ref 5–34)
Albumin: 3.5 g/dL (ref 3.5–5.0)
Alkaline Phosphatase: 75 U/L (ref 40–150)
Anion Gap: 6 mEq/L (ref 3–11)
BILIRUBIN TOTAL: 0.32 mg/dL (ref 0.20–1.20)
BUN: 31.2 mg/dL — ABNORMAL HIGH (ref 7.0–26.0)
CO2: 25 mEq/L (ref 22–29)
CREATININE: 1.6 mg/dL — AB (ref 0.7–1.3)
Calcium: 9.2 mg/dL (ref 8.4–10.4)
Chloride: 109 mEq/L (ref 98–109)
EGFR: 48 mL/min/{1.73_m2} — ABNORMAL LOW (ref >90)
GLUCOSE: 168 mg/dL — AB (ref 70–140)
Potassium: 3.9 mEq/L (ref 3.5–5.1)
SODIUM: 140 meq/L (ref 136–145)
TOTAL PROTEIN: 7.1 g/dL (ref 6.4–8.3)

## 2016-06-28 LAB — FERRITIN: Ferritin: 23 ng/ml (ref 22–316)

## 2016-06-28 LAB — LACTATE DEHYDROGENASE: LDH: 198 U/L (ref 125–245)

## 2016-06-28 LAB — CHCC SMEAR

## 2016-06-28 NOTE — Progress Notes (Signed)
Marland Kitchen    HEMATOLOGY/ONCOLOGY CONSULTATION NOTE  Date of Service: 06/28/2016  Patient Care Team: [name], MD as PCP - General (Internal Medicine)  CHIEF COMPLAINTS/PURPOSE OF CONSULTATION:  Anemia  HISTORY OF PRESENTING ILLNESS:  Roger Blackwell is a wonderful 81 y.o. male who has been referred to [place] by Dr .Horatio Pel, MD  for evaluation and management of Anemia.  Patient has a h/o multiple medical co-morbidities including HTN, DM2, HLD, CHF, CAD , morbid obesity, gout, prostate cancer treated in 1988 followed by Dr. Jeffie Pollock.   Patient recently had labs with his primary care physician which showed a hemoglobin of 10 with an MCV of 82 normal WBC count of 7.8k and normal platelet count of 182k. Ferritin level was 44 with an iron saturation 14%. Patient had fecal occult blood testing 3 which was negative. Nose no acute bleeding.  Has been on Lupron shots with his urologist which can also cause some anemia. Has chronic kidney disease with his last creatinine being in the 1.5-1.8 range is chronic kidney disease stage III. Patient also has history of gout and is on chronic allopurinol and takes Colichicine prn for acute gout attacks.  Patient notes no acute weight loss. Notes that his diabetes under reasonable control. No bone pains. Was previously on oral iron 15-20 years ago but not recently. No other evidence of acute bleeding or blood loss.  MEDICAL HISTORY:  Past Medical History:  Diagnosis Date  . CAD (coronary artery disease)   . CHF (congestive heart failure) ([place])   . DM (diabetes mellitus) ([place])   . Dyslipidemia   . Gout   . Morbid obesity ([place])   . Systemic hypertension   History of adenomatous polyps Prostate cancer diagnosed in 1988 followed by Dr. Jeffie Pollock Hypertension, coronary artery disease history of positive PPD Doses Heart appearing Acanthosis nigricans COPD Erectile dysfunction Male hypogonadism Chronic kidney disease  SURGICAL HISTORY: Past  Surgical History:  Procedure Laterality Date  . BACK SURGERY    . CORONARY ANGIOPLASTY WITH STENT PLACEMENT  12/26/2000   80% prox. circumflex lesion w/successful stent placement.  Marland Kitchen NM MYOVIEW LTD  08/31/2010   Normal  . PERICARDIOCENTESIS    . PROSTATE SURGERY    . US ECHOCARDIOGRAPHY  08/31/2010   EF 50-55%,RV mildly dilated,mild aortic root dilatation    SOCIAL HISTORY: Social History   Social History  . Marital status: Married    Spouse name: N/A  . Number of children: N/A  . Years of education: N/A   Occupational History  . Not on file.   Social History Main Topics  . Smoking status: Former Smoker    Quit date: 05/16/1993  . Smokeless tobacco: Never Used  . Alcohol use Yes     Comment: seldom  . Drug use: No  . Sexual activity: Not on file   Other Topics Concern  . Not on file   Social History Narrative  . No narrative on file    FAMILY HISTORY: Family History  Problem Relation Age of Onset  . Diabetes Brother   . Hypertension Sister     ALLERGIES:  is allergic to ivp dye [iodinated diagnostic agents].  MEDICATIONS:  Current Outpatient Prescriptions  Medication Sig Dispense Refill  . allopurinol (ZYLOPRIM) 100 MG tablet TAKE 1 TABLET ONCE A DAY ORALLY 30 DAY(S)  0  . Amlodipine-Valsartan-HCTZ (EXFORGE HCT) 10-320-25 MG TABS Take 1 tablet by mouth daily.    Marland Kitchen aspirin 81 MG tablet Take 81 mg by mouth daily.    Marland Kitchen  colchicine 0.6 MG tablet Take 0.6 mg by mouth daily as needed.     . furosemide (LASIX) 80 MG tablet Take 1 tablet (80 mg total) by mouth daily. 30 tablet 6  . insulin aspart (NOVOLOG) 100 UNIT/ML injection Inject 8 Units into the skin 3 (three) times daily before meals.     . Insulin Glargine (BASAGLAR KWIKPEN) 100 UNIT/ML SOPN Inject 28 Units into the skin at bedtime.    . pantoprazole (PROTONIX) 40 MG tablet 1 TABLET ONCE A DAY ORALLY 90 DAYS  0  . potassium chloride SA (K-DUR,KLOR-CON) 20 MEQ tablet Take 20 mEq by mouth daily.    .  rosuvastatin (CRESTOR) 10 MG tablet Take 10 mg by mouth daily.    . Travoprost, BAK Free, (TRAVATAN) 0.004 % SOLN ophthalmic solution Place 1 drop into both eyes at bedtime.     No current facility-administered medications for this visit.     REVIEW OF SYSTEMS:    10 Point review of Systems was done is negative except as noted above.  PHYSICAL EXAMINATION: ECOG PERFORMANCE STATUS: 2 - Symptomatic, <50% confined to bed  . Vitals:   06/28/16 1145  BP: (!) 136/51  Pulse: 61  Resp: 18  Temp: 98 F (36.7 C)   Filed Weights   06/28/16 1145  Weight: (!) 321 lb 8 oz (145.8 kg)   .Body mass index is 40.18 kg/m.  GENERAL:alert, in no acute distress and comfortable, morbidly obese SKIN: no acute rashes EYES: normal, conjunctiva are pink and non-injected, sclera clear OROPHARYNX:no exudate, no erythema and lips, buccal mucosa, and tongue normal  NECK: supple, JVD+ LYMPH:  no palpable lymphadenopathy in the cervical, axillary or inguinal LUNGS: clear to auscultation with normal respiratory effor, distant breath sounds HEART: distant heart sounds, regular rate & rhythm ABDOMEN: abdomen obese soft, non-tender, normoactive bowel sounds , hepatosplenomegaly difficult to palpate due to body habitus . Musculoskeletal: no cyanosis of digits and no clubbing  PSYCH: alert & oriented x 3 with fluent speech NEURO: no focal motor/sensory deficits  LABORATORY DATA:  I have reviewed the data as listed  . CBC Latest Ref Rng & Units 06/28/2016 07/22/2007 07/14/2007  WBC 4.0 - 10.3 10e3/uL 8.3 11.9(H) -  Hemoglobin 13.0 - 17.1 g/dL 10.2(L) 12.2(L) 13.3  Hematocrit 38.4 - 49.9 % 31.9(L) 36.8(L) 40.4  Platelets 140 - 400 10e3/uL 172 159 -    . CMP Latest Ref Rng & Units 06/28/2016 06/28/2016 07/22/2007  Glucose 70 - 140 mg/dl 168(H) - 141(H)  BUN 7.0 - 26.0 mg/dL 31.2(H) - 24(H)  Creatinine 0.7 - 1.3 mg/dL 1.6(H) - 1.48  Sodium 136 - 145 mEq/L 140 - 136  Potassium 3.5 - 5.1 mEq/L 3.9 - 4.2    Chloride - - - 102  CO2 22 - 29 mEq/L 25 - 27  Calcium 8.4 - 10.4 mg/dL 9.2 - 8.6  Total Protein 6.0 - 8.5 g/dL 7.1 6.7 -  Total Bilirubin 0.20 - 1.20 mg/dL 0.32 - -  Alkaline Phos 40 - 150 U/L 75 - -  AST 5 - 34 U/L 18 - -  ALT 0 - 55 U/L 16 - -   B12  -285 . Lab Results  Component Value Date   LDH 198 06/28/2016   . Lab Results  Component Value Date   IRON 49 06/28/2016   TIBC 295 06/28/2016   IRONPCTSAT 17 (L) 06/28/2016   (Iron and TIBC)  Lab Results  Component Value Date   FERRITIN 23 06/28/2016  RADIOGRAPHIC STUDIES: I have personally reviewed the radiological images as listed and agreed with the findings in the report. No results found.  ASSESSMENT & PLAN:   81 yo morbidly obese gentleman with multiple medical co-morbidities as noted above with   1) Normocytic/Borderline Microcytic Anemia This appears to be likely multifactorial.  - some element of iron deficiency and the ferritin of 23 and iron saturation of 17%. This is fairly low in the setting of chronic kidney disease whether goals would be ferritin of more than 100 and iron saturation of more than 30%. -Patient notes that he is on Lupron shots and this could cause a drop of 1 g to 2 g of hemoglobin from baseline due to decreased testosterone levels. -His chronic kidney disease is also causing an element of anemia of chronic disease. -Myeloma panel shows no M spike. Some increase in serum free light chains likely due to decreased renal clearance. -Vitamin B12 levels borderline low at 285 -Normal LDH suggest against hemolysis. No overt GI bleeding. He could have some decreased absorption due to chronic PPI use.  Plan -Workup ordered and reviewed as noted above. -No indication for PRBC transfusion at this time. -Would recommend starting the patient on iron polysaccharide 150 mg by mouth twice daily with orange juice. -If he does not tolerate oral iron would offer him IV iron  replacement. -B12 replacement with sublingual B12 1000 g daily -Would recommend B complex 1 tablet by mouth daily . -If the patient's hemoglobin drops below 9 despite ferritin levels of more than 100 would consider EPO therapy for treatment of anemia of chronic disease . -Continue follow-up with primary care physician for management of other medical comorbidities .  Return to care with Dr. Irene Limbo in 3 months after taking the oral iron B12 and B complex with repeat labs .  All of the patients questions were answered with apparent satisfaction. The patient knows to call the clinic with any problems, questions or concerns.  I spent 45 minutes counseling the patient face to face. The total time spent in the appointment was 60 minutes and more than 50% was on counseling and direct patient cares.    Sullivan Lone MD [place] AAHIVMS [facility] Hematology/Oncology Physician [facility]  (Office):       [phone] (Work cell):  [phone] (Fax):           [phone]  06/28/2016 12:04 PM

## 2016-06-29 LAB — VITAMIN B12: VITAMIN B 12: 285 pg/mL (ref 232–1245)

## 2016-06-29 LAB — KAPPA/LAMBDA LIGHT CHAINS
IG LAMBDA FREE LIGHT CHAIN: 35.1 mg/L — AB (ref 5.7–26.3)
Ig Kappa Free Light Chain: 61.6 mg/L — ABNORMAL HIGH (ref 3.3–19.4)
KAPPA/LAMBDA FLC RATIO: 1.75 — AB (ref 0.26–1.65)

## 2016-07-01 LAB — MULTIPLE MYELOMA PANEL, SERUM
ALBUMIN/GLOB SERPL: 1.1 (ref 0.7–1.7)
ALPHA 1: 0.2 g/dL (ref 0.0–0.4)
ALPHA2 GLOB SERPL ELPH-MCNC: 0.7 g/dL (ref 0.4–1.0)
Albumin SerPl Elph-Mcnc: 3.4 g/dL (ref 2.9–4.4)
B-GLOBULIN SERPL ELPH-MCNC: 1 g/dL (ref 0.7–1.3)
Gamma Glob SerPl Elph-Mcnc: 1.4 g/dL (ref 0.4–1.8)
Globulin, Total: 3.3 g/dL (ref 2.2–3.9)
IGM (IMMUNOGLOBIN M), SRM: 41 mg/dL (ref 15–143)
IgA, Qn, Serum: 279 mg/dL (ref 61–437)
TOTAL PROTEIN: 6.7 g/dL (ref 6.0–8.5)

## 2016-07-03 ENCOUNTER — Telehealth: Payer: Self-pay | Admitting: Hematology

## 2016-07-03 NOTE — Telephone Encounter (Signed)
Lvm advising appt 3/12 @ 10am.

## 2016-07-15 ENCOUNTER — Telehealth: Payer: Self-pay | Admitting: Hematology

## 2016-07-15 ENCOUNTER — Telehealth: Payer: Self-pay | Admitting: *Deleted

## 2016-07-15 ENCOUNTER — Other Ambulatory Visit: Payer: Self-pay | Admitting: *Deleted

## 2016-07-15 DIAGNOSIS — D649 Anemia, unspecified: Secondary | ICD-10-CM

## 2016-07-15 NOTE — Telephone Encounter (Signed)
-----   Message from Arty Baumgartner, RN sent at 07/12/2016  9:47 AM EST ----- Regarding: FW: followup change   ----- Message ----- From: Brunetta Genera, MD Sent: 07/11/2016  11:18 PM To: Arty Baumgartner, RN Subject: followup change                                Loren, COuld you please have schedule change Mr Edgell's followup from 3/12 to 2 months later. Also plz call him with his lab results and recommendations per my note.  He needs to start Iron polysaccharide '150mg'$  po BID OTC, SL B12 and B complex. We will see him back in about 12 weeks with labs cbc, cmp, ferritin, iron profile, B12.  Thanks [place]

## 2016-07-15 NOTE — Telephone Encounter (Signed)
Informed patient  Patient verbalized understanding  

## 2016-07-15 NOTE — Telephone Encounter (Signed)
lvm to inform pt of r/s appt to 5/14 at 0930 per LOS. Letter mailed 3/1

## 2016-07-26 ENCOUNTER — Ambulatory Visit: Payer: 59 | Admitting: Hematology

## 2016-09-27 ENCOUNTER — Ambulatory Visit (HOSPITAL_BASED_OUTPATIENT_CLINIC_OR_DEPARTMENT_OTHER): Payer: 59 | Admitting: Hematology

## 2016-09-27 ENCOUNTER — Encounter: Payer: Self-pay | Admitting: Hematology

## 2016-09-27 ENCOUNTER — Other Ambulatory Visit (HOSPITAL_BASED_OUTPATIENT_CLINIC_OR_DEPARTMENT_OTHER): Payer: 59

## 2016-09-27 VITALS — BP 127/59 | HR 65 | Temp 97.7°F | Resp 18 | Ht 75.0 in | Wt 318.3 lb

## 2016-09-27 DIAGNOSIS — E538 Deficiency of other specified B group vitamins: Secondary | ICD-10-CM | POA: Diagnosis not present

## 2016-09-27 DIAGNOSIS — D509 Iron deficiency anemia, unspecified: Secondary | ICD-10-CM | POA: Diagnosis not present

## 2016-09-27 DIAGNOSIS — D649 Anemia, unspecified: Secondary | ICD-10-CM

## 2016-09-27 DIAGNOSIS — D631 Anemia in chronic kidney disease: Secondary | ICD-10-CM | POA: Diagnosis not present

## 2016-09-27 DIAGNOSIS — N183 Chronic kidney disease, stage 3 (moderate): Secondary | ICD-10-CM

## 2016-09-27 LAB — COMPREHENSIVE METABOLIC PANEL
ALT: 17 U/L (ref 0–55)
ANION GAP: 8 meq/L (ref 3–11)
AST: 20 U/L (ref 5–34)
Albumin: 3.3 g/dL — ABNORMAL LOW (ref 3.5–5.0)
Alkaline Phosphatase: 73 U/L (ref 40–150)
BUN: 38.3 mg/dL — ABNORMAL HIGH (ref 7.0–26.0)
CHLORIDE: 111 meq/L — AB (ref 98–109)
CO2: 23 meq/L (ref 22–29)
CREATININE: 1.8 mg/dL — AB (ref 0.7–1.3)
Calcium: 9 mg/dL (ref 8.4–10.4)
EGFR: 42 mL/min/{1.73_m2} — ABNORMAL LOW (ref >90)
Glucose: 58 mg/dl — ABNORMAL LOW (ref 70–140)
POTASSIUM: 4.2 meq/L (ref 3.5–5.1)
Sodium: 142 mEq/L (ref 136–145)
Total Bilirubin: 0.31 mg/dL (ref 0.20–1.20)
Total Protein: 6.8 g/dL (ref 6.4–8.3)

## 2016-09-27 LAB — CBC & DIFF AND RETIC
BASO%: 0.3 % (ref 0.0–2.0)
Basophils Absolute: 0 10*3/uL (ref 0.0–0.1)
EOS%: 5.2 % (ref 0.0–7.0)
Eosinophils Absolute: 0.4 10*3/uL (ref 0.0–0.5)
HCT: 29.3 % — ABNORMAL LOW (ref 38.4–49.9)
HGB: 9.3 g/dL — ABNORMAL LOW (ref 13.0–17.1)
Immature Retic Fract: 7 % (ref 3.00–10.60)
LYMPH#: 1.6 10*3/uL (ref 0.9–3.3)
LYMPH%: 20.5 % (ref 14.0–49.0)
MCH: 26 pg — AB (ref 27.2–33.4)
MCHC: 31.7 g/dL — AB (ref 32.0–36.0)
MCV: 81.8 fL (ref 79.3–98.0)
MONO#: 0.6 10*3/uL (ref 0.1–0.9)
MONO%: 7.4 % (ref 0.0–14.0)
NEUT#: 5.3 10*3/uL (ref 1.5–6.5)
NEUT%: 66.6 % (ref 39.0–75.0)
PLATELETS: 156 10*3/uL (ref 140–400)
RBC: 3.58 10*6/uL — AB (ref 4.20–5.82)
RDW: 16.2 % — ABNORMAL HIGH (ref 11.0–14.6)
RETIC %: 0.86 % (ref 0.80–1.80)
RETIC CT ABS: 30.79 10*3/uL — AB (ref 34.80–93.90)
WBC: 7.9 10*3/uL (ref 4.0–10.3)

## 2016-09-27 LAB — IRON AND TIBC
%SAT: 13 % — ABNORMAL LOW (ref 20–55)
IRON: 32 ug/dL — AB (ref 42–163)
TIBC: 251 ug/dL (ref 202–409)
UIBC: 219 ug/dL (ref 117–376)

## 2016-09-27 LAB — FERRITIN: Ferritin: 53 ng/ml (ref 22–316)

## 2016-09-27 NOTE — Patient Instructions (Signed)
Thank you for choosing [facility] to provide your oncology and hematology care.  To afford each patient quality time with our providers, please arrive 30 minutes before your scheduled appointment time.  If you arrive late for your appointment, you may be asked to reschedule.  We strive to give you quality time with our providers, and arriving late affects you and other patients whose appointments are after yours.  If you are a no show for multiple scheduled visits, you may be dismissed from the clinic at the providers discretion.   Again, thank you for choosing [facility], our hope is that these requests will decrease the amount of time that you wait before being seen by our physicians.  ______________________________________________________________________ Should you have questions after your visit to the [facility], please contact our office at [phone] between the hours of 8:30 and 4:30 p.m.    Voicemails left after 4:30p.m will not be returned until the following business day.   For prescription refill requests, please have your pharmacy contact us directly.  Please also try to allow 48 hours for prescription requests.   Please contact the scheduling department for questions regarding scheduling.  For scheduling of procedures such as PET scans, CT scans, MRI, Ultrasound, etc please contact central scheduling at [phone].   Resources For Cancer Patients and Caregivers:  American Cancer Society:  800-227-2345  Can help patients locate various types of support and financial assistance Cancer Care: 1-800-813-HOPE (4673) Provides financial assistance, online support groups, medication/co-pay assistance.   [place] DSS:  336-641-3447 Where to apply for food stamps, Medicaid, and utility assistance Medicare Rights Center: 800-333-4114 Helps people with Medicare understand their rights and benefits, navigate the Medicare system, and secure the  quality healthcare they deserve SCAT: 336-333-6589 [place] [employer]'s shared-ride transportation service for eligible riders who have a disability that prevents them from riding the fixed route bus.   For additional information on assistance programs please contact our social worker:   Grier Hock/Abigail Elmore:  [phone] 

## 2016-09-27 NOTE — Progress Notes (Signed)
Marland Kitchen    HEMATOLOGY/ONCOLOGY CONSULTATION NOTE  Date of Service: 09/27/2016  Patient Care Team: [name], MD as PCP - General (Internal Medicine)  CHIEF COMPLAINTS/PURPOSE OF CONSULTATION:  Anemia  HISTORY OF PRESENTING ILLNESS:  Roger Blackwell is a wonderful 81 y.o. male who has been referred to [place] by Dr .[name], MD  for evaluation and management of Anemia.  Patient has a h/o multiple medical co-morbidities including HTN, DM2, HLD, CHF, CAD , morbid obesity, gout, prostate cancer treated in 1988 followed by Dr. Jeffie Pollock.   Patient recently had labs with his primary care physician which showed a hemoglobin of 10 with an MCV of 82 normal WBC count of 7.8k and normal platelet count of 182k. Ferritin level was 44 with an iron saturation 14%. Patient had fecal occult blood testing 3 which was negative. Nose no acute bleeding.  Has been on Lupron shots with his urologist which can also cause some anemia. Has chronic kidney disease with his last creatinine being in the 1.5-1.8 range is chronic kidney disease stage III. Patient also has history of gout and is on chronic allopurinol and takes Colichicine prn for acute gout attacks.  Patient notes no acute weight loss. Notes that his diabetes under reasonable control. No bone pains. Was previously on oral iron 15-20 years ago but not recently. No other evidence of acute bleeding or blood loss.  INTERVAL HISTORY  Patient is here for follow-up of his multifactorial anemia. He notes that he is compliant with his oral iron B12 and B complex replacement. His stools have been dark due to by mouth iron but not melanotic. He notes that his orthopedic physician increase his allopurinol from 100 up to 300 mg daily.  No other overt issues with bleeding.  MEDICAL HISTORY:  Past Medical History:  Diagnosis Date  . CAD (coronary artery disease)   . CHF (congestive heart failure) ([place])   . DM (diabetes mellitus) ([place])   . Dyslipidemia   .  Gout   . Morbid obesity ([place])   . Systemic hypertension   History of adenomatous polyps Prostate cancer diagnosed in 1988 followed by Dr. Jeffie Pollock Hypertension, coronary artery disease history of positive PPD Doses Heart appearing Acanthosis nigricans COPD Erectile dysfunction Male hypogonadism Chronic kidney disease  SURGICAL HISTORY: Past Surgical History:  Procedure Laterality Date  . BACK SURGERY    . CORONARY ANGIOPLASTY WITH STENT PLACEMENT  12/26/2000   80% prox. circumflex lesion w/successful stent placement.  Marland Kitchen NM MYOVIEW LTD  08/31/2010   Normal  . PERICARDIOCENTESIS    . PROSTATE SURGERY    . US ECHOCARDIOGRAPHY  08/31/2010   EF 50-55%,RV mildly dilated,mild aortic root dilatation    SOCIAL HISTORY: Social History   Social History  . Marital status: Married    Spouse name: N/A  . Number of children: N/A  . Years of education: N/A   Occupational History  . Not on file.   Social History Main Topics  . Smoking status: Former Smoker    Quit date: 05/16/1993  . Smokeless tobacco: Never Used  . Alcohol use Yes     Comment: seldom  . Drug use: No  . Sexual activity: Not on file   Other Topics Concern  . Not on file   Social History Narrative  . No narrative on file    FAMILY HISTORY: Family History  Problem Relation Age of Onset  . Diabetes Brother   . Hypertension Sister     ALLERGIES:  is allergic to  ivp dye [iodinated diagnostic agents].  MEDICATIONS:  Current Outpatient Prescriptions  Medication Sig Dispense Refill  . allopurinol (ZYLOPRIM) 300 MG tablet 1 TABLET ONCE A DAY ORALLY 30 DAYS  3  . Amlodipine-Valsartan-HCTZ (EXFORGE HCT) 10-320-25 MG TABS Take 1 tablet by mouth daily.    Marland Kitchen aspirin 81 MG tablet Take 81 mg by mouth daily.    . colchicine 0.6 MG tablet Take 0.6 mg by mouth daily as needed.     . furosemide (LASIX) 80 MG tablet Take 1 tablet (80 mg total) by mouth daily. 30 tablet 6  . insulin aspart (NOVOLOG) 100 UNIT/ML injection  Inject 8 Units into the skin 3 (three) times daily before meals.     . Insulin Glargine (BASAGLAR KWIKPEN) 100 UNIT/ML SOPN Inject 28 Units into the skin at bedtime.    . pantoprazole (PROTONIX) 40 MG tablet 1 TABLET ONCE A DAY ORALLY 90 DAYS  0  . potassium chloride SA (K-DUR,KLOR-CON) 20 MEQ tablet Take 20 mEq by mouth daily.    . rosuvastatin (CRESTOR) 10 MG tablet Take 10 mg by mouth daily.    . Travoprost, BAK Free, (TRAVATAN) 0.004 % SOLN ophthalmic solution Place 1 drop into both eyes at bedtime.     No current facility-administered medications for this visit.     REVIEW OF SYSTEMS:    10 Point review of Systems was done is negative except as noted above.  PHYSICAL EXAMINATION: ECOG PERFORMANCE STATUS: 2 - Symptomatic, <50% confined to bed  . Vitals:   09/27/16 1003  BP: (!) 127/59  Pulse: 65  Resp: 18  Temp: 97.7 F (36.5 C)   Filed Weights   09/27/16 1003  Weight: (!) 318 lb 4.8 oz (144.4 kg)   .Body mass index is 39.78 kg/m.  GENERAL:alert, in no acute distress and comfortable, morbidly obese SKIN: no acute rashes EYES: normal, conjunctiva are pink and non-injected, sclera clear OROPHARYNX:no exudate, no erythema and lips, buccal mucosa, and tongue normal  NECK: supple, JVD+ LYMPH:  no palpable lymphadenopathy in the cervical, axillary or inguinal LUNGS: clear to auscultation with normal respiratory effor, distant breath sounds HEART: distant heart sounds, regular rate & rhythm ABDOMEN: abdomen obese soft, non-tender, normoactive bowel sounds , hepatosplenomegaly difficult to palpate due to body habitus . Musculoskeletal: no cyanosis of digits and no clubbing  PSYCH: alert & oriented x 3 with fluent speech NEURO: no focal motor/sensory deficits  LABORATORY DATA:  I have reviewed the data as listed  . CBC Latest Ref Rng & Units 09/27/2016 06/28/2016 07/22/2007  WBC 4.0 - 10.3 10e3/uL 7.9 8.3 11.9(H)  Hemoglobin 13.0 - 17.1 g/dL 9.3(L) 10.2(L) 12.2(L)    Hematocrit 38.4 - 49.9 % 29.3(L) 31.9(L) 36.8(L)  Platelets 140 - 400 10e3/uL 156 172 159    . CMP Latest Ref Rng & Units 06/28/2016 06/28/2016 07/22/2007  Glucose 70 - 140 mg/dl 168(H) - 141(H)  BUN 7.0 - 26.0 mg/dL 31.2(H) - 24(H)  Creatinine 0.7 - 1.3 mg/dL 1.6(H) - 1.48  Sodium 136 - 145 mEq/L 140 - 136  Potassium 3.5 - 5.1 mEq/L 3.9 - 4.2  Chloride - - - 102  CO2 22 - 29 mEq/L 25 - 27  Calcium 8.4 - 10.4 mg/dL 9.2 - 8.6  Total Protein 6.0 - 8.5 g/dL 7.1 6.7 -  Total Bilirubin 0.20 - 1.20 mg/dL 0.32 - -  Alkaline Phos 40 - 150 U/L 75 - -  AST 5 - 34 U/L 18 - -  ALT 0 - 55  U/L 16 - -   B12  -285 . Lab Results  Component Value Date   LDH 198 06/28/2016   . Lab Results  Component Value Date   IRON 49 06/28/2016   TIBC 295 06/28/2016   IRONPCTSAT 17 (L) 06/28/2016   (Iron and TIBC)  Lab Results  Component Value Date   FERRITIN 23 06/28/2016         RADIOGRAPHIC STUDIES: I have personally reviewed the radiological images as listed and agreed with the findings in the report. No results found.  ASSESSMENT & PLAN:   81 yo morbidly obese gentleman with multiple medical co-morbidities as noted above with   1) Normocytic/Borderline Microcytic Anemia This appears to be likely multifactorial.  - some element of iron deficiency and the ferritin of 23 and iron saturation of 17%. This is fairly low in the setting of chronic kidney disease whether goals would be ferritin of more than 100 and iron saturation of more than 30%. -Patient notes that he is on Lupron shots and this could cause a drop of 1 g to 2 g of hemoglobin from baseline due to decreased testosterone levels. -His chronic kidney disease is also causing an element of anemia of chronic disease. -Myeloma panel shows no M spike. Some increase in serum free light chains likely due to decreased renal clearance. -Vitamin B12 levels borderline low at 285 -Normal LDH suggest against hemolysis. No overt GI bleeding.  He could have some decreased absorption due to chronic PPI use.  Plan -Patient's hemoglobin is slightly lower at 9.3 down from 10.2. -He recently had his allopurinol dose increased which can sometimes add to the anemia. --No indication for PRBC transfusion at this time. -He continues to be on  iron polysaccharide 150 mg by mouth twice daily with orange juice.Elevating ferritin levels from today is still below 50 will consider offering him IV iron replacement. -B12 replacement with sublingual B12 1000 g daily. He is taking the oral capsules which he might not due to his chronic PPI therapy. Recommended a switch to a sublingual liquid preparation. -Would recommend B complex 1 tablet by mouth daily . -If the patient's hemoglobin drops below 9 despite ferritin levels of more than 100 would consider EPO therapy for treatment of anemia of chronic disease .In that case might need to consider a bone marrow exam Bm Bx -Continue follow-up with primary care physician for management of other medical comorbidities .  Return to care with Dr. Irene Limbo in 4 months with repeat labs .  All of the patients questions were answered with apparent satisfaction. The patient knows to call the clinic with any problems, questions or concerns.  I spent 20 minutes counseling the patient face to face. The total time spent in the appointment was 25 minutes and more than 50% was on counseling and direct patient cares.    [facility] Lone MD [place] AAHIVMS [facility] Hematology/Oncology Physician [facility]  (Office):       [phone] (Work cell):  [phone] (Fax):           [phone]  09/27/2016 10:23 AM

## 2016-09-28 LAB — VITAMIN B12: Vitamin B12: 563 pg/mL (ref 232–1245)

## 2016-10-01 ENCOUNTER — Telehealth: Payer: Self-pay | Admitting: Hematology

## 2016-10-01 NOTE — Telephone Encounter (Signed)
Scheduled appt per 5/14 los. Reminder letter sent to patient

## 2017-01-31 NOTE — Progress Notes (Signed)
Marland Kitchen    HEMATOLOGY/ONCOLOGY FOLLOW UP NOTE  Date of Service: 02/01/2017  Patient Care Team: [name], MD as PCP - General (Internal Medicine)  CHIEF COMPLAINTS/PURPOSE OF CONSULTATION:  Anemia  HISTORY OF PRESENTING ILLNESS:  Roger Blackwell is a wonderful 81 y.o. male who has been referred to [place] by Dr .[name], MD  for evaluation and management of Anemia.  Patient has a h/o multiple medical co-morbidities including HTN, DM2, HLD, CHF, CAD , morbid obesity, gout, prostate cancer treated in 1988 followed by Dr. Jeffie Pollock.   Patient recently had labs with his primary care physician which showed a hemoglobin of 10 with an MCV of 82 normal WBC count of 7.8k and normal platelet count of 182k. Ferritin level was 44 with an iron saturation 14%. Patient had fecal occult blood testing 3 which was negative. Nose no acute bleeding.  Has been on Lupron shots with his urologist which can also cause some anemia. Has chronic kidney disease with his last creatinine being in the 1.5-1.8 range is chronic kidney disease stage III. Patient also has history of gout and is on chronic allopurinol and takes Colichicine prn for acute gout attacks.  Patient notes no acute weight loss. Notes that his diabetes under reasonable control. No bone pains. Was previously on oral iron 15-20 years ago but not recently. No other evidence of acute bleeding or blood loss.  INTERVAL HISTORY  Patient is here for follow-up of his multifactorial anemia. Since our last visit, he notices a new rash, that is very itchy, onset for 2-3 weeks. He thinks it may be from taking too many medications at once. He has not spoken to his PCP about it and hasn't started any new medications. He denies any gout flares recently or upper respiratory infections, mouth sores or other rashes. He has taken Benadryl, which he says helped ease the pruritus a little.  He notes that he is compliant with his oral iron B12 and B complex replacement.       MEDICAL HISTORY:  Past Medical History:  Diagnosis Date  . CAD (coronary artery disease)   . CHF (congestive heart failure) ([place])   . DM (diabetes mellitus) ([place])   . Dyslipidemia   . Gout   . Morbid obesity ([place])   . Systemic hypertension   History of adenomatous polyps Prostate cancer diagnosed in 1988 followed by Dr. Jeffie Pollock Hypertension, coronary artery disease history of positive PPD Doses Heart appearing Acanthosis nigricans COPD Erectile dysfunction Male hypogonadism Chronic kidney disease  SURGICAL HISTORY: Past Surgical History:  Procedure Laterality Date  . BACK SURGERY    . CORONARY ANGIOPLASTY WITH STENT PLACEMENT  12/26/2000   80% prox. circumflex lesion w/successful stent placement.  Marland Kitchen NM MYOVIEW LTD  08/31/2010   Normal  . PERICARDIOCENTESIS    . PROSTATE SURGERY    . US ECHOCARDIOGRAPHY  08/31/2010   EF 50-55%,RV mildly dilated,mild aortic root dilatation    SOCIAL HISTORY: Social History   Social History  . Marital status: Married    Spouse name: N/A  . Number of children: N/A  . Years of education: N/A   Occupational History  . Not on file.   Social History Main Topics  . Smoking status: Former Smoker    Quit date: 05/16/1993  . Smokeless tobacco: Never Used  . Alcohol use Yes     Comment: seldom  . Drug use: No  . Sexual activity: Not on file   Other Topics Concern  . Not on file  Social History Narrative  . No narrative on file    FAMILY HISTORY: Family History  Problem Relation Age of Onset  . Diabetes Brother   . Hypertension Sister     ALLERGIES:  is allergic to ivp dye [iodinated diagnostic agents].  MEDICATIONS:  Current Outpatient Prescriptions  Medication Sig Dispense Refill  . allopurinol (ZYLOPRIM) 300 MG tablet 1 TABLET ONCE A DAY ORALLY 30 DAYS  3  . Amlodipine-Valsartan-HCTZ (EXFORGE HCT) 10-320-25 MG TABS Take 1 tablet by mouth daily.    Marland Kitchen aspirin 81 MG tablet Take 81 mg by mouth daily.    . colchicine  0.6 MG tablet Take 0.6 mg by mouth daily as needed.     . furosemide (LASIX) 80 MG tablet Take 1 tablet (80 mg total) by mouth daily. 30 tablet 6  . insulin aspart (NOVOLOG) 100 UNIT/ML injection Inject 8 Units into the skin 3 (three) times daily before meals.     . Insulin Glargine (BASAGLAR KWIKPEN) 100 UNIT/ML SOPN Inject 28 Units into the skin at bedtime.    . iron polysaccharides (NIFEREX) 150 MG capsule Take 150 mg by mouth 2 (two) times daily.    . pantoprazole (PROTONIX) 40 MG tablet 1 TABLET ONCE A DAY ORALLY 90 DAYS  0  . potassium chloride SA (K-DUR,KLOR-CON) 20 MEQ tablet Take 20 mEq by mouth daily.    . rosuvastatin (CRESTOR) 10 MG tablet Take 10 mg by mouth daily.    . Travoprost, BAK Free, (TRAVATAN) 0.004 % SOLN ophthalmic solution Place 1 drop into both eyes at bedtime.    . vitamin B-12 (CYANOCOBALAMIN) 100 MCG tablet Take 100 mcg by mouth daily.     No current facility-administered medications for this visit.     REVIEW OF SYSTEMS:    10 Point review of Systems was done is negative except as noted above.  PHYSICAL EXAMINATION:  ECOG PERFORMANCE STATUS: 2 - Symptomatic, <50% confined to bed  . Vitals:   02/01/17 0854  BP: (!) 122/51  Pulse: 78  Resp: 18  Temp: (!) 97.5 F (36.4 C)  SpO2: 100%   Filed Weights   02/01/17 0854  Weight: (!) 313 lb 4.8 oz (142.1 kg)   .Body mass index is 39.16 kg/m.  GENERAL:alert, in no acute distress and comfortable, morbidly obese SKIN: (+)red macular rash of upper chest, pruritic with some eczematoid changes from pruritus EYES: normal, conjunctiva are pink and non-injected, sclera clear OROPHARYNX:no exudate, no erythema and lips, buccal mucosa, and tongue normal  NECK: supple, JVD+ LYMPH:  no palpable lymphadenopathy in the cervical, axillary or inguinal LUNGS: clear to auscultation with normal respiratory effor, distant breath sounds HEART: distant heart sounds, regular rate & rhythm ABDOMEN: abdomen obese soft,  non-tender, normoactive bowel sounds , hepatosplenomegaly difficult to palpate due to body habitus . Musculoskeletal: no cyanosis of digits and no clubbing  PSYCH: alert & oriented x 3 with fluent speech NEURO: no focal motor/sensory deficits  LABORATORY DATA:  I have reviewed the data as listed  . CBC Latest Ref Rng & Units 02/01/2017 09/27/2016 06/28/2016  WBC 4.0 - 10.3 10e3/uL 9.4 7.9 8.3  Hemoglobin 13.0 - 17.1 g/dL 9.6(L) 9.3(L) 10.2(L)  Hematocrit 38.4 - 49.9 % 30.6(L) 29.3(L) 31.9(L)  Platelets 140 - 400 10e3/uL 183 156 172    . CMP Latest Ref Rng & Units 02/01/2017 09/27/2016 06/28/2016  Glucose 70 - 140 mg/dl 167(H) 58(L) 168(H)  BUN 7.0 - 26.0 mg/dL 36.1(H) 38.3(H) 31.2(H)  Creatinine 0.7 - 1.3 mg/dL  1.5(H) 1.8(H) 1.6(H)  Sodium 136 - 145 mEq/L 139 142 140  Potassium 3.5 - 5.1 mEq/L 4.2 4.2 3.9  Chloride - - - -  CO2 22 - 29 mEq/L 22 23 25   Calcium 8.4 - 10.4 mg/dL 9.4 9.0 9.2  Total Protein 6.4 - 8.3 g/dL 7.1 6.8 7.1  Total Bilirubin 0.20 - 1.20 mg/dL 0.27 0.31 0.32  Alkaline Phos 40 - 150 U/L 89 73 75  AST 5 - 34 U/L 20 20 18   ALT 0 - 55 U/L 16 17 16    B12  -285 . Lab Results  Component Value Date   LDH 198 06/28/2016   . Lab Results  Component Value Date   IRON 32 (L) 09/27/2016   TIBC 251 09/27/2016   IRONPCTSAT 13 (L) 09/27/2016   (Iron and TIBC)  Lab Results  Component Value Date   FERRITIN 53 09/27/2016         RADIOGRAPHIC STUDIES: I have personally reviewed the radiological images as listed and agreed with the findings in the report. No results found.  ASSESSMENT & PLAN:   81 yo morbidly obese gentleman with multiple medical co-morbidities as noted above with   1) Normocytic/Borderline Microcytic Anemia This appears to be likely multifactorial.  - some element of iron deficiency and the ferritin of 23 and iron saturation of 17% has previously imrpoved to ferritin of 53 with PO iron replacement. This is fairly low in the setting of  chronic kidney disease whether goals would be ferritin of more than 100 and iron saturation of more than 30%. -Patient notes that he is on Lupron shots and this could cause a drop of 1 g to 2 g of hemoglobin from baseline due to decreased testosterone levels. -His chronic kidney disease is also causing an element of anemia of chronic disease. -Myeloma panel shows no M spike. Some increase in serum free light chains likely due to decreased renal clearance. -Vitamin B12 levels borderline low at 285- improved to 563 with replacement -Normal LDH suggest against hemolysis. No overt GI bleeding. He could have some decreased absorption due to chronic PPI use.  Plan -Patient's hemoglobin has improved a little to 9.6 and is mostly stable. -No indication for PRBC transfusion at this time. -He continues to be on  iron polysaccharide 150 mg by mouth twice daily with orange juice  Awaiting Ferritin levels from today. If Iron levels on rising would consider IV Iron -B12 replacement with sublingual B12 1000 g daily. -If the patient's hemoglobin drops below 9 despite ferritin levels of more than 100 would consider EPO therapy for treatment of anemia of chronic disease .In that case might need to consider a bone marrow exam Bm Bx -Continue follow-up with primary care physician for management of other medical comorbidities .  #2 rash of upper chest - Could be related to Allopurinol vs viral exanthem - I strongly recommend he hold Allopurinol until he sees Dr. Shelia Media within a week - I recommend he take OTC Zyrtec 10mg  daily continuously for 1-[redacted] weeks along with Benadryl 25mg  q8h prn and calamine loption topically. - if worsens, he should seek emergent help  Return to care with Dr. Irene Limbo in 6 months with repeat labs   All of the patients questions were answered with apparent satisfaction. The patient knows to call the clinic with any problems, questions or concerns.  I spent 20 minutes counseling the patient  face to face. The total time spent in the appointment was 25 minutes and more  than 50% was on counseling and direct patient cares.  This document serves as a record of services personally performed by [facility] Lone, MD. It was created on his behalf by Brandt Loosen, a trained medical scribe. The creation of this record is based on the scribe's personal observations and the provider's statements to them. This document has been checked and approved by the attending provider.    S[facility] Lone MD [place] AAHIVMS [facility] Hematology/Oncology Physician [facility]  (Office):       [phone] (Work cell):  [phone] (Fax):           [phone]  02/01/2017 9:15 AM

## 2017-02-01 ENCOUNTER — Encounter: Payer: Self-pay | Admitting: Hematology

## 2017-02-01 ENCOUNTER — Ambulatory Visit (HOSPITAL_BASED_OUTPATIENT_CLINIC_OR_DEPARTMENT_OTHER): Payer: 59 | Admitting: Hematology

## 2017-02-01 ENCOUNTER — Telehealth: Payer: Self-pay | Admitting: Hematology

## 2017-02-01 ENCOUNTER — Other Ambulatory Visit (HOSPITAL_BASED_OUTPATIENT_CLINIC_OR_DEPARTMENT_OTHER): Payer: 59

## 2017-02-01 VITALS — BP 122/51 | HR 78 | Temp 97.5°F | Resp 18 | Wt 313.3 lb

## 2017-02-01 DIAGNOSIS — D631 Anemia in chronic kidney disease: Secondary | ICD-10-CM | POA: Diagnosis not present

## 2017-02-01 DIAGNOSIS — D509 Iron deficiency anemia, unspecified: Secondary | ICD-10-CM

## 2017-02-01 DIAGNOSIS — N183 Chronic kidney disease, stage 3 (moderate): Secondary | ICD-10-CM

## 2017-02-01 DIAGNOSIS — D649 Anemia, unspecified: Secondary | ICD-10-CM

## 2017-02-01 DIAGNOSIS — R21 Rash and other nonspecific skin eruption: Secondary | ICD-10-CM

## 2017-02-01 DIAGNOSIS — E538 Deficiency of other specified B group vitamins: Secondary | ICD-10-CM

## 2017-02-01 LAB — CBC & DIFF AND RETIC
BASO%: 0.3 % (ref 0.0–2.0)
BASOS ABS: 0 10*3/uL (ref 0.0–0.1)
EOS ABS: 0.6 10*3/uL — AB (ref 0.0–0.5)
EOS%: 5.9 % (ref 0.0–7.0)
HEMATOCRIT: 30.6 % — AB (ref 38.4–49.9)
HEMOGLOBIN: 9.6 g/dL — AB (ref 13.0–17.1)
Immature Retic Fract: 5.5 % (ref 3.00–10.60)
LYMPH%: 20 % (ref 14.0–49.0)
MCH: 26 pg — AB (ref 27.2–33.4)
MCHC: 31.4 g/dL — ABNORMAL LOW (ref 32.0–36.0)
MCV: 82.9 fL (ref 79.3–98.0)
MONO#: 0.5 10*3/uL (ref 0.1–0.9)
MONO%: 5.7 % (ref 0.0–14.0)
NEUT#: 6.4 10*3/uL (ref 1.5–6.5)
NEUT%: 68.1 % (ref 39.0–75.0)
Platelets: 183 10*3/uL (ref 140–400)
RBC: 3.69 10*6/uL — ABNORMAL LOW (ref 4.20–5.82)
RDW: 16.2 % — ABNORMAL HIGH (ref 11.0–14.6)
Retic %: 1.08 % (ref 0.80–1.80)
Retic Ct Abs: 39.85 10*3/uL (ref 34.80–93.90)
WBC: 9.4 10*3/uL (ref 4.0–10.3)
lymph#: 1.9 10*3/uL (ref 0.9–3.3)

## 2017-02-01 LAB — COMPREHENSIVE METABOLIC PANEL
ALBUMIN: 3.4 g/dL — AB (ref 3.5–5.0)
ALK PHOS: 89 U/L (ref 40–150)
ALT: 16 U/L (ref 0–55)
AST: 20 U/L (ref 5–34)
Anion Gap: 8 mEq/L (ref 3–11)
BUN: 36.1 mg/dL — AB (ref 7.0–26.0)
CALCIUM: 9.4 mg/dL (ref 8.4–10.4)
CO2: 22 mEq/L (ref 22–29)
Chloride: 109 mEq/L (ref 98–109)
Creatinine: 1.5 mg/dL — ABNORMAL HIGH (ref 0.7–1.3)
EGFR: 50 mL/min/{1.73_m2} — AB (ref >90)
Glucose: 167 mg/dl — ABNORMAL HIGH (ref 70–140)
POTASSIUM: 4.2 meq/L (ref 3.5–5.1)
Sodium: 139 mEq/L (ref 136–145)
Total Bilirubin: 0.27 mg/dL (ref 0.20–1.20)
Total Protein: 7.1 g/dL (ref 6.4–8.3)

## 2017-02-01 LAB — FERRITIN: Ferritin: 39 ng/ml (ref 22–316)

## 2017-02-01 NOTE — Patient Instructions (Addendum)
Patient instructions  OTC Zyrtec10mg  daily continuously for 1-[redacted] weeks along with Benadryl 25mg  every 8hrs as needed for rash/itching  and calamine topical lotion OTC.   Thank you for choosing [facility] to provide your oncology and hematology care.  To afford each patient quality time with our providers, please arrive 30 minutes before your scheduled appointment time.  If you arrive late for your appointment, you may be asked to reschedule.  We strive to give you quality time with our providers, and arriving late affects you and other patients whose appointments are after yours.   If you are a no show for multiple scheduled visits, you may be dismissed from the clinic at the providers discretion.    Again, thank you for choosing [facility], our hope is that these requests will decrease the amount of time that you wait before being seen by our physicians.  ______________________________________________________________________  Should you have questions after your visit to the [facility], please contact our office at [phone] between the hours of 8:30 and 4:30 p.m.    Voicemails left after 4:30p.m will not be returned until the following business day.    For prescription refill requests, please have your pharmacy contact us directly.  Please also try to allow 48 hours for prescription requests.    Please contact the scheduling department for questions regarding scheduling.  For scheduling of procedures such as PET scans, CT scans, MRI, Ultrasound, etc please contact central scheduling at [phone].    Resources For Cancer Patients and Caregivers:   Oncolink.org:  A wonderful resource for patients and healthcare providers for information regarding your disease, ways to tract your treatment, what to expect, etc.     [place]:  307-810-1269  Can help patients locate various types of support and financial assistance  Cancer  Care: 1-800-813-HOPE 513-044-2632) Provides financial assistance, online support groups, medication/co-pay assistance.    [place]:  715-328-3701 Where to apply for food stamps, Medicaid, and utility assistance  Medicare Rights [facility]: 719 473 0045 Helps people with Medicare understand their rights and benefits, navigate the Medicare system, and secure the quality healthcare they deserve  [employer]: [employer]'s shared-ride transportation service for eligible riders who have a disability that prevents them from riding the fixed route bus.    For additional information on assistance programs please contact our social worker:   [place]:  [phone]

## 2017-02-01 NOTE — Telephone Encounter (Signed)
Scheduled appt per 9/18 los - lab and f/u in 6 months - sent reminder letter in the mail with appt date and time.

## 2017-02-02 LAB — VITAMIN B12

## 2017-02-07 ENCOUNTER — Encounter: Payer: Self-pay | Admitting: Cardiovascular Disease

## 2017-02-07 ENCOUNTER — Ambulatory Visit (INDEPENDENT_AMBULATORY_CARE_PROVIDER_SITE_OTHER): Payer: 59 | Admitting: Cardiovascular Disease

## 2017-02-07 VITALS — BP 102/56 | HR 70 | Ht 73.0 in | Wt 312.0 lb

## 2017-02-07 DIAGNOSIS — Z794 Long term (current) use of insulin: Secondary | ICD-10-CM

## 2017-02-07 DIAGNOSIS — I2510 Atherosclerotic heart disease of native coronary artery without angina pectoris: Secondary | ICD-10-CM | POA: Diagnosis not present

## 2017-02-07 DIAGNOSIS — I5032 Chronic diastolic (congestive) heart failure: Secondary | ICD-10-CM | POA: Diagnosis not present

## 2017-02-07 DIAGNOSIS — E119 Type 2 diabetes mellitus without complications: Secondary | ICD-10-CM

## 2017-02-07 DIAGNOSIS — E785 Hyperlipidemia, unspecified: Secondary | ICD-10-CM

## 2017-02-07 DIAGNOSIS — I10 Essential (primary) hypertension: Secondary | ICD-10-CM

## 2017-02-07 MED ORDER — AMLODIPINE-VALSARTAN-HCTZ 5-160-25 MG PO TABS: 1.0000 | ORAL_TABLET | Freq: Every day | ORAL | 3 refills | Status: DC

## 2017-02-07 NOTE — Progress Notes (Signed)
Cardiology Office Note    Date:  02/08/2017   ID:  Roger Blackwell, DOB 01-Nov-1935, MRN [id number]  PCP:  [name], MD  Cardiologist:   Sanda Klein, MD   Chief Complaint  Patient presents with  . Follow-up    PT C/O SOB    History of Present Illness:  Roger Blackwell is a 81 y.o. male with CAD status post remote PCI, diastolic heart failure and systemic hypertension.  He feels well. He denies any problems with exertional angina or dyspnea, palpitations or syncope. He has not had any focal neurological events and denies claudication. He has had some problems with orthostatic dizziness and his blood pressure is lower than in the past. He has lost some weight, 6 pounds since May. He has chronic lower extremity edema, not any worse than usual.  Lipid profile was excellent with total cholesterol 109, HDL 47, LDL 50, triglycerides 61 (May 2018).   He has a history of a stent placed to the left circumflex coronary artery for an 80% stenosis in 2002 (Dr. Glade Lloyd). He has not had new coronary event since that time. A nuclear stress test performed in April of 2012 showed normal myocardial perfusion. He has long-standing hypertension and requires multiple agents for blood pressure control and this is likely the substrate for his heart failure. His ejection fraction by echocardiography is normal and there are no convincing signs of diastolic dysfunction, but he responded well to diuretic therapy. He avoids taking diuretics due to problems with prostate and urinary bladder option.    Past Medical History:  Diagnosis Date  . CAD (coronary artery disease)   . CHF (congestive heart failure) ([place])   . DM (diabetes mellitus) ([place])   . Dyslipidemia   . Gout   . Morbid obesity ([place])   . Systemic hypertension     Past Surgical History:  Procedure Laterality Date  . BACK SURGERY    . CORONARY ANGIOPLASTY WITH STENT PLACEMENT  12/26/2000   80% prox. circumflex lesion w/successful stent  placement.  Marland Kitchen NM MYOVIEW LTD  08/31/2010   Normal  . PERICARDIOCENTESIS    . PROSTATE SURGERY    . US ECHOCARDIOGRAPHY  08/31/2010   EF 50-55%,RV mildly dilated,mild aortic root dilatation    Current Medications: Outpatient Medications Prior to Visit  Medication Sig Dispense Refill  . allopurinol (ZYLOPRIM) 300 MG tablet 1 TABLET ONCE A DAY ORALLY 30 DAYS  3  . aspirin 81 MG tablet Take 81 mg by mouth daily.    . colchicine 0.6 MG tablet Take 0.6 mg by mouth daily as needed.     . furosemide (LASIX) 80 MG tablet Take 1 tablet (80 mg total) by mouth daily. 30 tablet 6  . insulin aspart (NOVOLOG) 100 UNIT/ML injection Inject 8 Units into the skin 3 (three) times daily before meals.     . Insulin Glargine (BASAGLAR KWIKPEN) 100 UNIT/ML SOPN Inject 28 Units into the skin at bedtime.    . iron polysaccharides (NIFEREX) 150 MG capsule Take 150 mg by mouth 2 (two) times daily.    . pantoprazole (PROTONIX) 40 MG tablet 1 TABLET ONCE A DAY ORALLY 90 DAYS  0  . potassium chloride SA (K-DUR,KLOR-CON) 20 MEQ tablet Take 20 mEq by mouth daily.    . Travoprost, BAK Free, (TRAVATAN) 0.004 % SOLN ophthalmic solution Place 1 drop into both eyes at bedtime.    . vitamin B-12 (CYANOCOBALAMIN) 100 MCG tablet Take 100 mcg by mouth daily.    Marland Kitchen  Amlodipine-Valsartan-HCTZ (EXFORGE HCT) 10-320-25 MG TABS Take 1 tablet by mouth daily.    . rosuvastatin (CRESTOR) 10 MG tablet Take 10 mg by mouth daily.     No facility-administered medications prior to visit.      Allergies:   Ivp dye [iodinated diagnostic agents]   Social History   Social History  . Marital status: Married    Spouse name: N/A  . Number of children: N/A  . Years of education: N/A   Social History Main Topics  . Smoking status: Former Smoker    Quit date: 05/16/1993  . Smokeless tobacco: Never Used  . Alcohol use Yes     Comment: seldom  . Drug use: No  . Sexual activity: Not Asked   Other Topics Concern  . None   Social History  Narrative  . None     Family History:  The patient's family history includes Diabetes in his brother; Hypertension in his sister.   ROS:   Please see the history of present illness.    ROS All other systems reviewed and are negative.   PHYSICAL EXAM:   VS:  BP (!) 102/56   Pulse 70   Ht '6\' 1"'  (1.854 m)   Wt (!) 312 lb (141.5 kg)   BMI 41.16 kg/m     General: Alert, oriented x3, no distress, morbidly obese Head: no evidence of trauma, PERRL, EOMI, no exophtalmos or lid lag, no myxedema, no xanthelasma; normal ears, nose and oropharynx Neck: normal jugular venous pulsations and no hepatojugular reflux; brisk carotid pulses without delay and no carotid bruits Chest: clear to auscultation, no signs of consolidation by percussion or palpation, normal fremitus, symmetrical and full respiratory excursions Cardiovascular: normal position and quality of the apical impulse, regular rhythm, normal first and second heart sounds, no murmurs, rubs or gallops Abdomen: no tenderness or distention, no masses by palpation, no abnormal pulsatility or arterial bruits, normal bowel sounds, no hepatosplenomegaly Extremities: no clubbing, cyanosis; symmetrical 3+ ankle and pedal edema; 2+ radial, ulnar and brachial pulses bilaterally; 2+ right femoral, posterior tibial and dorsalis pedis pulses; 2+ left femoral, posterior tibial and dorsalis pedis pulses; no subclavian or femoral bruits Neurological: grossly nonfocal Psych: Normal mood and affect   Wt Readings from Last 3 Encounters:  02/07/17 (!) 312 lb (141.5 kg)  02/01/17 (!) 313 lb 4.8 oz (142.1 kg)  09/27/16 (!) 318 lb 4.8 oz (144.4 kg)      Studies/Labs Reviewed:   EKG:  EKG is ordered today.  The ekg ordered today demonstrates Normal sinus rhythm, normal tracing except QTC 481 ms  BMET    Component Value Date/Time   NA 139 02/01/2017 0829   K 4.2 02/01/2017 0829   CL 102 07/22/2007 0525   CO2 22 02/01/2017 0829   GLUCOSE 167 (H)  02/01/2017 0829   BUN 36.1 (H) 02/01/2017 0829   CREATININE 1.5 (H) 02/01/2017 0829   CALCIUM 9.4 02/01/2017 0829   GFRNONAA 47 (L) 07/22/2007 0525   GFRAA (L) 07/22/2007 0525    57        The eGFR has been calculated using the MDRD equation. This calculation has not been validated in all clinical     total cholesterol 109, HDL 47, LDL 50, triglycerides 61 (May 2018).  ASSESSMENT:    1. Chronic diastolic heart failure ([place])   2. Coronary artery disease involving native coronary artery of native heart without angina pectoris   3. Severe hypertension   4. Dyslipidemia  5. Diabetes mellitus type 2, insulin dependent ([place])   6. Morbid obesity ([place])      PLAN:  In order of problems listed above:  1. CHF: Edema is similar to or less than usual. He prefers to just his own diuretic dose to avoid frequent urination, and he denies problems with exertional dyspnea. Has preserved LV function. 2. CAD: Angina free. No need for revascularization in over 15 years. 3. HTN: His beta blocker dose had to be steadily curtailed over the years as he developed bradycardia, heart rate is okay today. His blood pressure has been trending downward as he has lost some weight. We'll decrease the dose of his Exforge HCT. Fortunately, unable to get generic Diovan. Will decrease the dose of both the valsartan and the amlodipine in his antihypertensive. 4. HLP: on Statin with an excellent lipid profile 5. DM: He sees his diabetic specialist in October. He believes that he has had very good glycemic control. 6. Obesity: He is steadily and successfully losing weight, but remains morbidly obese. Encouraged him to keep trying  Medication Adjustments/Labs and Tests Ordered: Current medicines are reviewed at length with the patient today.  Concerns regarding medicines are outlined above.  Medication changes, Labs and Tests ordered today are listed in the Patient Instructions below. Patient Instructions  Dr Sallyanne Kuster  has recommended making the following medication changes: 1. DECREASE Exforge HCT to 5-160-25 mg daily. A new prescription has been sent to your pharmacy electronically.  Your physician has requested that you regularly monitor your blood pressure at home. Please use the same machine to check your blood pressure daily. Keep a record of your blood pressures using the log sheet provided. In 2 weeks, please report your readings back to Dr C. You may use our online patient portal 'MyChart' or you can call the office to speak with a nurse.  Dr Sallyanne Kuster recommends that you schedule a follow-up appointment in 6 months. You will receive a reminder letter in the mail two months in advance. If you don't receive a letter, please call our office to schedule the follow-up appointment.  If you need a refill on your cardiac medications before your next appointment, please call your pharmacy.    Signed, Sanda Klein, MD  02/08/2017 1:22 PM    [facility] [address], [place], [place]  [address] Phone: [phone]; Fax: [phone]

## 2017-02-07 NOTE — Patient Instructions (Signed)
Dr Sallyanne Kuster has recommended making the following medication changes: 1. DECREASE Exforge HCT to 5-160-25 mg daily. A new prescription has been sent to your pharmacy electronically.  Your physician has requested that you regularly monitor your blood pressure at home. Please use the same machine to check your blood pressure daily. Keep a record of your blood pressures using the log sheet provided. In 2 weeks, please report your readings back to Dr C. You may use our online patient portal 'MyChart' or you can call the office to speak with a nurse.  Dr Sallyanne Kuster recommends that you schedule a follow-up appointment in 6 months. You will receive a reminder letter in the mail two months in advance. If you don't receive a letter, please call our office to schedule the follow-up appointment.  If you need a refill on your cardiac medications before your next appointment, please call your pharmacy.

## 2017-03-03 ENCOUNTER — Telehealth: Payer: Self-pay | Admitting: Cardiovascular Disease

## 2017-03-03 NOTE — Telephone Encounter (Signed)
Please call,needs to give you an update on his blood pressure after taking his medicine for two weeks.

## 2017-03-03 NOTE — Telephone Encounter (Signed)
Attempted to return call to patient. Phone rang, no answer

## 2017-03-04 NOTE — Telephone Encounter (Signed)
Returned call to patient of Dr. C who was seen on 02/07/17 and med change made:  -- DECREASE Exforge HCT to 5-160-25 mg daily.   10/9 4:24pm BP 142/70 HR 67 10/10 2pm BP 151/74 HR 69 10/11 3:15pm BP 138/68 HR 61 10/12 4pm BP 146/73 HR 63 10/13 4:30pm BP 136/62 HR 72 10/14 3:30pm BP 140/69 HR 69 10/15 3pm BP 138/68 HR 65  Advised patient to continue current meds Notified him will send to MD for review but he is OOO today, so he will get a f/up call next week Will route to MD/CMA for follow up

## 2017-03-04 NOTE — Telephone Encounter (Signed)
Yes, somewhat erratic BP, but I would keep same meds. MCr

## 2017-03-08 NOTE — Telephone Encounter (Signed)
Patient aware of MD recommendation to continue current meds

## 2017-03-24 DIAGNOSIS — I10 Essential (primary) hypertension: Secondary | ICD-10-CM | POA: Diagnosis not present

## 2017-03-24 DIAGNOSIS — E7800 Pure hypercholesterolemia, unspecified: Secondary | ICD-10-CM | POA: Diagnosis not present

## 2017-03-24 DIAGNOSIS — E1121 Type 2 diabetes mellitus with diabetic nephropathy: Secondary | ICD-10-CM | POA: Diagnosis not present

## 2017-03-31 DIAGNOSIS — Z23 Encounter for immunization: Secondary | ICD-10-CM | POA: Diagnosis not present

## 2017-03-31 DIAGNOSIS — I10 Essential (primary) hypertension: Secondary | ICD-10-CM | POA: Diagnosis not present

## 2017-03-31 DIAGNOSIS — E1121 Type 2 diabetes mellitus with diabetic nephropathy: Secondary | ICD-10-CM | POA: Diagnosis not present

## 2017-03-31 DIAGNOSIS — E7800 Pure hypercholesterolemia, unspecified: Secondary | ICD-10-CM | POA: Diagnosis not present

## 2017-04-06 DIAGNOSIS — E790 Hyperuricemia without signs of inflammatory arthritis and tophaceous disease: Secondary | ICD-10-CM | POA: Diagnosis not present

## 2017-04-06 DIAGNOSIS — M109 Gout, unspecified: Secondary | ICD-10-CM | POA: Diagnosis not present

## 2017-04-06 DIAGNOSIS — N189 Chronic kidney disease, unspecified: Secondary | ICD-10-CM | POA: Diagnosis not present

## 2017-04-06 DIAGNOSIS — M79644 Pain in right finger(s): Secondary | ICD-10-CM | POA: Diagnosis not present

## 2017-04-06 DIAGNOSIS — I509 Heart failure, unspecified: Secondary | ICD-10-CM | POA: Diagnosis not present

## 2017-04-14 DIAGNOSIS — E119 Type 2 diabetes mellitus without complications: Secondary | ICD-10-CM | POA: Diagnosis not present

## 2017-04-14 DIAGNOSIS — H401111 Primary open-angle glaucoma, right eye, mild stage: Secondary | ICD-10-CM | POA: Diagnosis not present

## 2017-05-02 DIAGNOSIS — J449 Chronic obstructive pulmonary disease, unspecified: Secondary | ICD-10-CM | POA: Diagnosis not present

## 2017-05-02 DIAGNOSIS — I10 Essential (primary) hypertension: Secondary | ICD-10-CM | POA: Diagnosis not present

## 2017-05-02 DIAGNOSIS — E1121 Type 2 diabetes mellitus with diabetic nephropathy: Secondary | ICD-10-CM | POA: Diagnosis not present

## 2017-05-02 DIAGNOSIS — I5030 Unspecified diastolic (congestive) heart failure: Secondary | ICD-10-CM | POA: Diagnosis not present

## 2017-05-02 DIAGNOSIS — E7800 Pure hypercholesterolemia, unspecified: Secondary | ICD-10-CM | POA: Diagnosis not present

## 2017-05-04 DIAGNOSIS — C61 Malignant neoplasm of prostate: Secondary | ICD-10-CM | POA: Diagnosis not present

## 2017-05-11 DIAGNOSIS — C775 Secondary and unspecified malignant neoplasm of intrapelvic lymph nodes: Secondary | ICD-10-CM | POA: Diagnosis not present

## 2017-05-11 DIAGNOSIS — C61 Malignant neoplasm of prostate: Secondary | ICD-10-CM | POA: Diagnosis not present

## 2017-05-18 DIAGNOSIS — E7800 Pure hypercholesterolemia, unspecified: Secondary | ICD-10-CM | POA: Diagnosis not present

## 2017-05-18 DIAGNOSIS — E1121 Type 2 diabetes mellitus with diabetic nephropathy: Secondary | ICD-10-CM | POA: Diagnosis not present

## 2017-05-18 DIAGNOSIS — J449 Chronic obstructive pulmonary disease, unspecified: Secondary | ICD-10-CM | POA: Diagnosis not present

## 2017-05-18 DIAGNOSIS — I5030 Unspecified diastolic (congestive) heart failure: Secondary | ICD-10-CM | POA: Diagnosis not present

## 2017-05-18 DIAGNOSIS — I10 Essential (primary) hypertension: Secondary | ICD-10-CM | POA: Diagnosis not present

## 2017-05-30 ENCOUNTER — Telehealth: Payer: Self-pay | Admitting: Cardiovascular Disease

## 2017-05-30 MED ORDER — AMLODIPINE BESYLATE 5 MG PO TABS: 5.0000 mg | ORAL_TABLET | Freq: Every day | ORAL | 3 refills | Status: DC

## 2017-05-30 MED ORDER — LOSARTAN POTASSIUM-HCTZ 100-25 MG PO TABS: 1.0000 | ORAL_TABLET | Freq: Every day | ORAL | 3 refills | Status: DC

## 2017-05-30 NOTE — Telephone Encounter (Signed)
Spoke with pt, aware of dr croitoru's recommendations.  

## 2017-05-30 NOTE — Telephone Encounter (Signed)
New Message   Pt c/o medication issue:  1. Name of Medication: Amlodipine-Valsartan-HCTZ 2. How are you currently taking this medication (dosage and times per day)? 5-160-25 MG TABS once a day   3. Are you having a reaction (difficulty breathing--STAT)? No   4. What is your medication issue? Patients issue is the cost of the medication. He wants to know is there anything else that he can take that may be cheaper. Please call.

## 2017-05-30 NOTE — Telephone Encounter (Signed)
It may be cheaper to break into the generic components. Use losartan-HCTZ 100/25 mg daily (valsartan recall), amlodipine 5 mg daily. MCr

## 2017-05-30 NOTE — Telephone Encounter (Signed)
Spoke with pt, patient is completely out of medication, amlodipine-valsartan-hctz. Will forward for dr croitoru's review and advise.

## 2017-06-06 DIAGNOSIS — E1121 Type 2 diabetes mellitus with diabetic nephropathy: Secondary | ICD-10-CM | POA: Diagnosis not present

## 2017-06-06 DIAGNOSIS — Z125 Encounter for screening for malignant neoplasm of prostate: Secondary | ICD-10-CM | POA: Diagnosis not present

## 2017-06-06 DIAGNOSIS — I10 Essential (primary) hypertension: Secondary | ICD-10-CM | POA: Diagnosis not present

## 2017-06-06 DIAGNOSIS — Z0000 Encounter for general adult medical examination without abnormal findings: Secondary | ICD-10-CM | POA: Diagnosis not present

## 2017-06-10 DIAGNOSIS — E7800 Pure hypercholesterolemia, unspecified: Secondary | ICD-10-CM | POA: Diagnosis not present

## 2017-06-10 DIAGNOSIS — I259 Chronic ischemic heart disease, unspecified: Secondary | ICD-10-CM | POA: Diagnosis not present

## 2017-06-10 DIAGNOSIS — Z789 Other specified health status: Secondary | ICD-10-CM | POA: Diagnosis not present

## 2017-06-10 DIAGNOSIS — Z0001 Encounter for general adult medical examination with abnormal findings: Secondary | ICD-10-CM | POA: Diagnosis not present

## 2017-06-10 DIAGNOSIS — I10 Essential (primary) hypertension: Secondary | ICD-10-CM | POA: Diagnosis not present

## 2017-07-11 DIAGNOSIS — J449 Chronic obstructive pulmonary disease, unspecified: Secondary | ICD-10-CM | POA: Diagnosis not present

## 2017-07-11 DIAGNOSIS — I10 Essential (primary) hypertension: Secondary | ICD-10-CM | POA: Diagnosis not present

## 2017-07-11 DIAGNOSIS — E7800 Pure hypercholesterolemia, unspecified: Secondary | ICD-10-CM | POA: Diagnosis not present

## 2017-07-11 DIAGNOSIS — I5030 Unspecified diastolic (congestive) heart failure: Secondary | ICD-10-CM | POA: Diagnosis not present

## 2017-07-11 DIAGNOSIS — E1121 Type 2 diabetes mellitus with diabetic nephropathy: Secondary | ICD-10-CM | POA: Diagnosis not present

## 2017-07-28 NOTE — Progress Notes (Signed)
Marland Kitchen    HEMATOLOGY/ONCOLOGY FOLLOW UP NOTE  Date of Service: 08/01/2017  Patient Care Team: [name], MD as PCP - General (Internal Medicine)  CHIEF COMPLAINTS/PURPOSE OF CONSULTATION:  F/u anemia   HISTORY OF PRESENTING ILLNESS:  Roger Blackwell is a wonderful 82 y.o. male who has been referred to [place] by Dr .[name], MD  for evaluation and management of Anemia.  Patient has a h/o multiple medical co-morbidities including HTN, DM2, HLD, CHF, CAD , morbid obesity, gout, prostate cancer treated in 1988 followed by Dr. Jeffie Pollock.   Patient recently had labs with his primary care physician which showed a hemoglobin of 10 with an MCV of 82 normal WBC count of 7.8k and normal platelet count of 182k. Ferritin level was 44 with an iron saturation 14%. Patient had fecal occult blood testing 3 which was negative. Nose no acute bleeding.  Has been on Lupron shots with his urologist which can also cause some anemia. Has chronic kidney disease with his last creatinine being in the 1.5-1.8 range is chronic kidney disease stage III. Patient also has history of gout and is on chronic allopurinol and takes Colichicine prn for acute gout attacks.  Patient notes no acute weight loss. Notes that his diabetes under reasonable control. No bone pains. Was previously on oral iron 15-20 years ago but not recently. No other evidence of acute bleeding or blood loss.  INTERVAL HISTORY  Patient is here for follow-up of his multifactorial anemia. The patient's last visit with [place] was on 02/01/17. The pt reports that he is doing well overall.   The pt reports that he has been taking his iron pills once a day. He continues to take his Lupron shot. He notes that he has had a cold recently, and that the rash he had at his last visit has resolved. He notes that his legs are swollen and that he is taking Lasix.   Lab results today (08/01/17) of CBC, CMP, and Reticulocytes is as follows: all values are WNL except  for RBC at 3.61, Hgb at 9.3, HCT at 29.5, MCH at 25.8, MCHC at 31.5, RDW at 15.6, Neutro Abs at 6.6k, Glucose at 54, Creatinine at 1.68, Albumin at 3.1. Vitamin B12 08/01/17 is 831 Ferritin 08/01/17 is pending.   On review of systems, pt reports a cold, feeling a little weak, leg swelling and denies shortness of breath, rashes, and any other symptoms.   MEDICAL HISTORY:  Past Medical History:  Diagnosis Date  . CAD (coronary artery disease)   . CHF (congestive heart failure) ([place])   . DM (diabetes mellitus) ([place])   . Dyslipidemia   . Gout   . Morbid obesity ([place])   . Systemic hypertension   History of adenomatous polyps Prostate cancer diagnosed in 1988 followed by Dr. Jeffie Pollock Hypertension, coronary artery disease history of positive PPD Doses Heart appearing Acanthosis nigricans COPD Erectile dysfunction Male hypogonadism Chronic kidney disease  SURGICAL HISTORY: Past Surgical History:  Procedure Laterality Date  . BACK SURGERY    . CORONARY ANGIOPLASTY WITH STENT PLACEMENT  12/26/2000   80% prox. circumflex lesion w/successful stent placement.  Marland Kitchen NM MYOVIEW LTD  08/31/2010   Normal  . PERICARDIOCENTESIS    . PROSTATE SURGERY    . US ECHOCARDIOGRAPHY  08/31/2010   EF 50-55%,RV mildly dilated,mild aortic root dilatation    SOCIAL HISTORY: Social History   Socioeconomic History  . Marital status: Married    Spouse name: Not on file  . Number of  children: Not on file  . Years of education: Not on file  . Highest education level: Not on file  Social Needs  . Financial resource strain: Not on file  . Food insecurity - worry: Not on file  . Food insecurity - inability: Not on file  . Transportation needs - medical: Not on file  . Transportation needs - non-medical: Not on file  Occupational History  . Not on file  Tobacco Use  . Smoking status: Former Smoker    Last attempt to quit: 05/16/1993    Years since quitting: 24.2  . Smokeless tobacco: Never Used    Substance and Sexual Activity  . Alcohol use: Yes    Comment: seldom  . Drug use: No  . Sexual activity: Not on file  Other Topics Concern  . Not on file  Social History Narrative  . Not on file    FAMILY HISTORY: Family History  Problem Relation Age of Onset  . Diabetes Brother   . Hypertension Sister     ALLERGIES:  is allergic to ivp dye [iodinated diagnostic agents].  MEDICATIONS:  Current Outpatient Medications  Medication Sig Dispense Refill  . allopurinol (ZYLOPRIM) 300 MG tablet 1 TABLET ONCE A DAY ORALLY 30 DAYS  3  . amLODipine (NORVASC) 5 MG tablet Take 1 tablet (5 mg total) by mouth daily. 90 tablet 3  . aspirin 81 MG tablet Take 81 mg by mouth daily.    . colchicine 0.6 MG tablet Take 0.6 mg by mouth daily as needed.     . furosemide (LASIX) 80 MG tablet Take 1 tablet (80 mg total) by mouth daily. 30 tablet 6  . insulin aspart (NOVOLOG) 100 UNIT/ML injection Inject 8 Units into the skin 3 (three) times daily before meals.     . Insulin Glargine (BASAGLAR KWIKPEN) 100 UNIT/ML SOPN Inject 28 Units into the skin at bedtime.    . iron polysaccharides (NIFEREX) 150 MG capsule Take 150 mg by mouth 2 (two) times daily.    Marland Kitchen losartan-hydrochlorothiazide (HYZAAR) 100-25 MG tablet Take 1 tablet by mouth daily. 90 tablet 3  . pantoprazole (PROTONIX) 40 MG tablet 1 TABLET ONCE A DAY ORALLY 90 DAYS  0  . potassium chloride SA (K-DUR,KLOR-CON) 20 MEQ tablet Take 20 mEq by mouth daily.    . Travoprost, BAK Free, (TRAVATAN) 0.004 % SOLN ophthalmic solution Place 1 drop into both eyes at bedtime.    . vitamin B-12 (CYANOCOBALAMIN) 100 MCG tablet Take 100 mcg by mouth daily.     No current facility-administered medications for this visit.     REVIEW OF SYSTEMS:    .10 Point review of Systems was done is negative except as noted above.   PHYSICAL EXAMINATION:  ECOG PERFORMANCE STATUS: 2 - Symptomatic, <50% confined to bed  . Vitals:   08/01/17 1308  BP: (!) 137/56   Pulse: 73  Resp: (!) 24  Temp: 98.2 F (36.8 C)  SpO2: 94%   Filed Weights   08/01/17 1308  Weight: 296 lb 11.2 oz (134.6 kg)   .Body mass index is 39.14 kg/m.  Marland Kitchen GENERAL:alert, in no acute distress and comfortable SKIN: no acute rashes, no significant lesions EYES: conjunctiva are pink and non-injected, sclera anicteric OROPHARYNX: MMM, no exudates, no oropharyngeal erythema or ulceration NECK: supple, no JVD LYMPH:  no palpable lymphadenopathy in the cervical, axillary or inguinal regions LUNGS: clear to auscultation b/l with normal respiratory effort HEART: regular rate & rhythm ABDOMEN:  normoactive bowel sounds ,  non tender, not distended. Extremity: 1+pedal edema PSYCH: alert & oriented x 3 with fluent speech NEURO: no focal motor/sensory deficits    LABORATORY DATA:  I have reviewed the data as listed  . CBC Latest Ref Rng & Units 08/01/2017 02/01/2017 09/27/2016  WBC 4.0 - 10.3 K/uL 9.9 9.4 7.9  Hemoglobin 13.0 - 17.1 g/dL - 9.6(L) 9.3(L)  Hematocrit 38.4 - 49.9 % 29.5(L) 30.6(L) 29.3(L)  Platelets 140 - 400 K/uL 205 183 156  HGB 9.3  . CMP Latest Ref Rng & Units 08/01/2017 02/01/2017 09/27/2016  Glucose 70 - 140 mg/dL 54(L) 167(H) 58(L)  BUN 7 - 26 mg/dL 25 36.1(H) 38.3(H)  Creatinine 0.70 - 1.30 mg/dL 1.68(H) 1.5(H) 1.8(H)  Sodium 136 - 145 mmol/L 142 139 142  Potassium 3.5 - 5.1 mmol/L 3.9 4.2 4.2  Chloride 98 - 109 mmol/L 109 - -  CO2 22 - 29 mmol/L 25 22 23   Calcium 8.4 - 10.4 mg/dL 9.3 9.4 9.0  Total Protein 6.4 - 8.3 g/dL 7.0 7.1 6.8  Total Bilirubin 0.2 - 1.2 mg/dL 0.3 0.27 0.31  Alkaline Phos 40 - 150 U/L 75 89 73  AST 5 - 34 U/L 23 20 20   ALT 0 - 55 U/L 26 16 17    B12  -285 . Lab Results  Component Value Date   LDH 198 06/28/2016   . Lab Results  Component Value Date   IRON 32 (L) 09/27/2016   TIBC 251 09/27/2016   IRONPCTSAT 13 (L) 09/27/2016   (Iron and TIBC)  Lab Results  Component Value Date   FERRITIN 39 02/01/2017    Component     Latest Ref Rng & Units 08/01/2017  Iron     42 - 163 ug/dL 22 (L)  TIBC     202 - 409 ug/dL 228  Saturation Ratios     42 - 163 % 10 (L)  UIBC     ug/dL 205  Vitamin B12     180 - 914 pg/mL 831  Ferritin     22 - 316 ng/mL 107         RADIOGRAPHIC STUDIES: I have personally reviewed the radiological images as listed and agreed with the findings in the report. No results found.  ASSESSMENT & PLAN:   82 yo morbidly obese gentleman with multiple medical co-morbidities as noted above with   1) Normocytic/Borderline Microcytic Anemia This appears to be likely multifactorial.  - some element of iron deficiency and the ferritin of 23 and iron saturation of 17% has previously imrpoved to ferritin of 53 with PO iron replacement. This is fairly low in the setting of chronic kidney disease whether goals would be ferritin of more than 100 and iron saturation of more than 30%. -Patient notes that he is on Lupron shots and this could cause a drop of 1 g to 2 g of hemoglobin from baseline due to decreased testosterone levels. -His chronic kidney disease is also causing an element of anemia of chronic disease. -Myeloma panel shows no M spike. Some increase in serum free light chains likely due to decreased renal clearance. -Vitamin B12 levels borderline low at 285- improved to 563 with replacement -Normal LDH suggest against hemolysis. No overt GI bleeding. He could have some decreased absorption due to chronic PPI use.  Plan -No indication for PRBC transfusion at this time. -Continue B12 replacement with sublingual B12 1000 g daily. -Discussed pt labwork today; Hgb at 9.3, ferritin is about 107 but with only 10%  saturation ssuggestive of relative iron deficiency and anemia of chronic disease. -Discussed that the pt needs more iron. - discussed IV Iron -- prn to maintain ferritin close to 200 and Iron saturation saturation of >20% -Recommend IV Injectafer x 1  dose -Will consider EPO in the future if IV iron does not improve anemia. -Advised pt to let us know if he develops any acute lightheadedness, dizziness or fatigue as he may need a transfusion.  -Continue follow-up with primary care physician for management of other medical comorbidities.  #2 rash of upper chest- Resolved.   IV injectafer weekly x 1 doses ASAP RTC with Dr Irene Limbo in 12weeks with labs  All of the patients questions were answered with apparent satisfaction. The patient knows to call the clinic with any problems, questions or concerns.  . The total time spent in the appointment was 20 minutes and more than 50% was on counseling and direct patient cares.      Sullivan Lone MD MS AAHIVMS [facility] Hematology/Oncology Physician [place]  (Office):       [phone] (Work cell):  [phone] (Fax):           [phone]  08/01/2017 1:19 PM  This document serves as a record of services personally performed by Sullivan Lone, MD. It was created on his behalf by Baldwin [place], a trained medical scribe. The creation of this record is based on the scribe's personal observations and the provider's statements to them.   .I have reviewed the above documentation for accuracy and completeness, and I agree with the above. Brunetta Genera MD MS

## 2017-07-29 ENCOUNTER — Other Ambulatory Visit: Payer: Self-pay

## 2017-07-29 DIAGNOSIS — E538 Deficiency of other specified B group vitamins: Secondary | ICD-10-CM

## 2017-07-29 DIAGNOSIS — D649 Anemia, unspecified: Secondary | ICD-10-CM

## 2017-08-01 ENCOUNTER — Inpatient Hospital Stay: Payer: Medicare Other

## 2017-08-01 ENCOUNTER — Telehealth: Payer: Self-pay | Admitting: Hematology

## 2017-08-01 ENCOUNTER — Inpatient Hospital Stay: Payer: Medicare Other | Attending: Hematology | Admitting: Hematology

## 2017-08-01 ENCOUNTER — Encounter: Payer: Self-pay | Admitting: Hematology

## 2017-08-01 VITALS — BP 137/56 | HR 73 | Temp 98.2°F | Resp 24 | Ht 73.0 in | Wt 296.7 lb

## 2017-08-01 DIAGNOSIS — D509 Iron deficiency anemia, unspecified: Secondary | ICD-10-CM | POA: Diagnosis not present

## 2017-08-01 DIAGNOSIS — E538 Deficiency of other specified B group vitamins: Secondary | ICD-10-CM | POA: Diagnosis not present

## 2017-08-01 DIAGNOSIS — D649 Anemia, unspecified: Secondary | ICD-10-CM

## 2017-08-01 DIAGNOSIS — I130 Hypertensive heart and chronic kidney disease with heart failure and stage 1 through stage 4 chronic kidney disease, or unspecified chronic kidney disease: Secondary | ICD-10-CM | POA: Insufficient documentation

## 2017-08-01 LAB — CBC WITH DIFFERENTIAL (CANCER CENTER ONLY)
BASOS ABS: 0 10*3/uL (ref 0.0–0.1)
Basophils Relative: 0 %
EOS ABS: 0.5 10*3/uL (ref 0.0–0.5)
EOS PCT: 5 %
HCT: 29.5 % — ABNORMAL LOW (ref 38.4–49.9)
HEMOGLOBIN: 9.3 g/dL — AB (ref 13.0–17.1)
LYMPHS ABS: 1.9 10*3/uL (ref 0.9–3.3)
Lymphocytes Relative: 19 %
MCH: 25.8 pg — AB (ref 27.2–33.4)
MCHC: 31.5 g/dL — ABNORMAL LOW (ref 32.0–36.0)
MCV: 81.7 fL (ref 79.3–98.0)
Monocytes Absolute: 0.9 10*3/uL (ref 0.1–0.9)
Monocytes Relative: 9 %
NEUTROS PCT: 67 %
Neutro Abs: 6.6 10*3/uL — ABNORMAL HIGH (ref 1.5–6.5)
Platelet Count: 205 10*3/uL (ref 140–400)
RBC: 3.61 MIL/uL — AB (ref 4.20–5.82)
RDW: 15.6 % — ABNORMAL HIGH (ref 11.0–14.6)
WBC: 9.9 10*3/uL (ref 4.0–10.3)

## 2017-08-01 LAB — CMP (CANCER CENTER ONLY)
ALT: 26 U/L (ref 0–55)
AST: 23 U/L (ref 5–34)
Albumin: 3.1 g/dL — ABNORMAL LOW (ref 3.5–5.0)
Alkaline Phosphatase: 75 U/L (ref 40–150)
Anion gap: 8 (ref 3–11)
BILIRUBIN TOTAL: 0.3 mg/dL (ref 0.2–1.2)
BUN: 25 mg/dL (ref 7–26)
CHLORIDE: 109 mmol/L (ref 98–109)
CO2: 25 mmol/L (ref 22–29)
CREATININE: 1.68 mg/dL — AB (ref 0.70–1.30)
Calcium: 9.3 mg/dL (ref 8.4–10.4)
GFR, EST AFRICAN AMERICAN: 43 mL/min — AB (ref >60)
GFR, EST NON AFRICAN AMERICAN: 37 mL/min — AB (ref >60)
Glucose, Bld: 54 mg/dL — ABNORMAL LOW (ref 70–140)
Potassium: 3.9 mmol/L (ref 3.5–5.1)
Sodium: 142 mmol/L (ref 136–145)
TOTAL PROTEIN: 7 g/dL (ref 6.4–8.3)

## 2017-08-01 LAB — IRON AND TIBC
IRON: 22 ug/dL — AB (ref 42–163)
Saturation Ratios: 10 % — ABNORMAL LOW (ref 42–163)
TIBC: 228 ug/dL (ref 202–409)
UIBC: 205 ug/dL

## 2017-08-01 LAB — VITAMIN B12: VITAMIN B 12: 831 pg/mL (ref 180–914)

## 2017-08-01 LAB — FERRITIN: FERRITIN: 107 ng/mL (ref 22–316)

## 2017-08-01 NOTE — Telephone Encounter (Signed)
Scheduled appt per 3/18 los - Gave patient AVS and calender per los.

## 2017-08-01 NOTE — Patient Instructions (Signed)
Thank you for choosing [facility] to provide your oncology and hematology care.  To afford each patient quality time with our providers, please arrive 30 minutes before your scheduled appointment time.  If you arrive late for your appointment, you may be asked to reschedule.  We strive to give you quality time with our providers, and arriving late affects you and other patients whose appointments are after yours.   If you are a no show for multiple scheduled visits, you may be dismissed from the clinic at the providers discretion.    Again, thank you for choosing [facility], our hope is that these requests will decrease the amount of time that you wait before being seen by our physicians.  ______________________________________________________________________  Should you have questions after your visit to the [facility], please contact our office at [phone] between the hours of 8:30 and 4:30 p.m.    Voicemails left after 4:30p.m will not be returned until the following business day.    For prescription refill requests, please have your pharmacy contact us directly.  Please also try to allow 48 hours for prescription requests.    Please contact the scheduling department for questions regarding scheduling.  For scheduling of procedures such as PET scans, CT scans, MRI, Ultrasound, etc please contact central scheduling at [phone].    Resources For Cancer Patients and Caregivers:   Oncolink.org:  A wonderful resource for patients and healthcare providers for information regarding your disease, ways to tract your treatment, what to expect, etc.     American Cancer Society:  800-227-2345  Can help patients locate various types of support and financial assistance  Cancer Care: 1-800-813-HOPE (4673) Provides financial assistance, online support groups, medication/co-pay assistance.    [place] DSS:  336-641-3447 Where to apply for food  stamps, Medicaid, and utility assistance  Medicare Rights Center: 800-333-4114 Helps people with Medicare understand their rights and benefits, navigate the Medicare system, and secure the quality healthcare they deserve  SCAT: 336-333-6589 [place] [employer]'s shared-ride transportation service for eligible riders who have a disability that prevents them from riding the fixed route bus.    For additional information on assistance programs please contact our social worker:   Grier Hock/Abigail Elmore:  [phone]            

## 2017-08-04 ENCOUNTER — Telehealth: Payer: Self-pay | Admitting: *Deleted

## 2017-08-04 NOTE — Telephone Encounter (Signed)
Per MD, patient does not need second scheduled dose of IV iron.  Apt 4/3 for infusion cancelled.  Message left at home for patient regarding this.  Note added to appointment note for 3/27 infusion.

## 2017-08-10 ENCOUNTER — Other Ambulatory Visit: Payer: Self-pay | Admitting: Hematology

## 2017-08-10 ENCOUNTER — Inpatient Hospital Stay: Payer: Medicare Other

## 2017-08-10 VITALS — BP 129/52 | HR 61 | Temp 97.9°F | Resp 17

## 2017-08-10 DIAGNOSIS — I130 Hypertensive heart and chronic kidney disease with heart failure and stage 1 through stage 4 chronic kidney disease, or unspecified chronic kidney disease: Secondary | ICD-10-CM | POA: Diagnosis not present

## 2017-08-10 DIAGNOSIS — E538 Deficiency of other specified B group vitamins: Secondary | ICD-10-CM | POA: Diagnosis not present

## 2017-08-10 DIAGNOSIS — D509 Iron deficiency anemia, unspecified: Secondary | ICD-10-CM | POA: Insufficient documentation

## 2017-08-10 DIAGNOSIS — D508 Other iron deficiency anemias: Secondary | ICD-10-CM

## 2017-08-10 MED ORDER — SODIUM CHLORIDE 0.9 % IV SOLN
750.0000 mg | Freq: Once | INTRAVENOUS | Status: AC
  Administered 2017-08-10: 750 mg via INTRAVENOUS
  Filled 2017-08-10: qty 15

## 2017-08-10 NOTE — Patient Instructions (Signed)
Ferric carboxymaltose injection What is this medicine? FERRIC CARBOXYMALTOSE (ferr-ik car-box-ee-mol-toes) is an iron complex. Iron is used to make healthy red blood cells, which carry oxygen and nutrients throughout the body. This medicine is used to treat anemia in people with chronic kidney disease or people who cannot take iron by mouth. This medicine may be used for other purposes; ask your health care provider or pharmacist if you have questions. COMMON BRAND NAME(S): Injectafer What should I tell my health care provider before I take this medicine? They need to know if you have any of these conditions: -anemia not caused by low iron levels -high levels of iron in the blood -liver disease -an unusual or allergic reaction to iron, other medicines, foods, dyes, or preservatives -pregnant or trying to get pregnant -breast-feeding How should I use this medicine? This medicine is for infusion into a vein. It is given by a health care professional in a hospital or clinic setting. Talk to your pediatrician regarding the use of this medicine in children. Special care may be needed. Overdosage: If you think you have taken too much of this medicine contact a poison control center or emergency room at once. NOTE: This medicine is only for you. Do not share this medicine with others. What if I miss a dose? It is important not to miss your dose. Call your doctor or health care professional if you are unable to keep an appointment. What may interact with this medicine? Do not take this medicine with any of the following medications: -deferoxamine -dimercaprol -other iron products This medicine may also interact with the following medications: -chloramphenicol -deferasirox This list may not describe all possible interactions. Give your health care provider a list of all the medicines, herbs, non-prescription drugs, or dietary supplements you use. Also tell them if you smoke, drink alcohol, or use  illegal drugs. Some items may interact with your medicine. What should I watch for while using this medicine? Visit your doctor or health care professional regularly. Tell your doctor if your symptoms do not start to get better or if they get worse. You may need blood work done while you are taking this medicine. You may need to follow a special diet. Talk to your doctor. Foods that contain iron include: whole grains/cereals, dried fruits, beans, or peas, leafy green vegetables, and organ meats (liver, kidney). What side effects may I notice from receiving this medicine? Side effects that you should report to your doctor or health care professional as soon as possible: -allergic reactions like skin rash, itching or hives, swelling of the face, lips, or tongue -breathing problems -changes in blood pressure -feeling faint or lightheaded, falls -flushing, sweating, or hot feelings Side effects that usually do not require medical attention (report to your doctor or health care professional if they continue or are bothersome): -changes in taste -constipation -dizziness -headache -nausea -pain, redness, or irritation at site where injected -vomiting This list may not describe all possible side effects. Call your doctor for medical advice about side effects. You may report side effects to FDA at 1-800-FDA-1088. Where should I keep my medicine? This drug is given in a hospital or clinic and will not be stored at home. NOTE: This sheet is a summary. It may not cover all possible information. If you have questions about this medicine, talk to your doctor, pharmacist, or health care provider.  2018 Elsevier/Gold Standard (2015-06-05 11:20:47)  

## 2017-08-17 ENCOUNTER — Ambulatory Visit: Payer: Medicare Other

## 2017-09-13 ENCOUNTER — Ambulatory Visit: Payer: Medicare Other | Admitting: Cardiovascular Disease

## 2017-09-13 ENCOUNTER — Encounter: Payer: Self-pay | Admitting: Cardiovascular Disease

## 2017-09-13 VITALS — BP 100/50 | HR 70 | Ht 73.0 in | Wt 289.2 lb

## 2017-09-13 DIAGNOSIS — I2510 Atherosclerotic heart disease of native coronary artery without angina pectoris: Secondary | ICD-10-CM | POA: Diagnosis not present

## 2017-09-13 DIAGNOSIS — E7800 Pure hypercholesterolemia, unspecified: Secondary | ICD-10-CM | POA: Diagnosis not present

## 2017-09-13 DIAGNOSIS — I10 Essential (primary) hypertension: Secondary | ICD-10-CM | POA: Diagnosis not present

## 2017-09-13 DIAGNOSIS — Z794 Long term (current) use of insulin: Secondary | ICD-10-CM | POA: Diagnosis not present

## 2017-09-13 DIAGNOSIS — N183 Chronic kidney disease, stage 3 unspecified: Secondary | ICD-10-CM

## 2017-09-13 DIAGNOSIS — I5032 Chronic diastolic (congestive) heart failure: Secondary | ICD-10-CM | POA: Diagnosis not present

## 2017-09-13 DIAGNOSIS — Z6838 Body mass index (BMI) 38.0-38.9, adult: Secondary | ICD-10-CM

## 2017-09-13 DIAGNOSIS — E1122 Type 2 diabetes mellitus with diabetic chronic kidney disease: Secondary | ICD-10-CM | POA: Diagnosis not present

## 2017-09-13 MED ORDER — AMLODIPINE BESYLATE 2.5 MG PO TABS: 2.5000 mg | ORAL_TABLET | Freq: Every day | ORAL | 3 refills | Status: DC

## 2017-09-13 NOTE — Patient Instructions (Signed)
Dr Sallyanne Kuster has recommended making the following medication changes: 1. DECREASE Amlodipine to 2.5 mg daily  Your physician has requested that you regularly monitor your blood pressure at home. Please use the same machine to check your blood pressure daily. Keep a record of your blood pressures using the log sheet provided. In 2 weeks, please report your readings back to Dr C. You may use our online patient portal 'MyChart' or you can call the office to speak with a nurse. You can fax these to [phone] attention: [place].  Dr Sallyanne Kuster recommends that you schedule a follow-up appointment in 6 months. You will receive a reminder letter in the mail two months in advance. If you don't receive a letter, please call our office to schedule the follow-up appointment.  If you need a refill on your cardiac medications before your next appointment, please call your pharmacy.

## 2017-09-13 NOTE — Progress Notes (Signed)
Cardiology Office Note    Date:  09/15/2017   ID:  Roger Blackwell, DOB 12-30-1935, MRN [id number]  PCP:  Roger Pretty, MD  Cardiologist:   Roger Klein, MD   Chief Complaint  Patient presents with  . Follow-up    pt denied chest pain  CAD, CHF Dizziness  History of Present Illness:  Roger Blackwell is a 82 y.o. male with CAD status post remote PCI, diastolic heart failure and systemic hypertension.  His wife is with him today.  He feels well.  His blood pressure is low and he has frequent episodes of dizziness.  This is especially a problem when changes in position, consistent with orthostatic hypotension.  He denies syncope or falls.  He denies any dyspnea at rest or with activity and specifically denies orthopnea or PND.  He has not had any angina pectoris either at rest or with exertion.  He is not complaining of palpitations.  He has minimal ankle edema.  He has not had any recent gout attacks.  Lipid profile was excellent with total cholesterol 109, HDL 47, LDL 50, triglycerides 61 (May 2018).   He has a history of a stent placed to the left circumflex coronary artery for an 80% stenosis in 2002 (Dr. Glade Lloyd). He has not had new coronary event since that time. A nuclear stress test performed in April of 2012 showed normal myocardial perfusion. He has long-standing hypertension and requires multiple agents for blood pressure control and this is likely the substrate for his heart failure. His ejection fraction by echocardiography is normal and there are no convincing signs of diastolic dysfunction, but he responded well to diuretic therapy.   Past Medical History:  Diagnosis Date  . CAD (coronary artery disease)   . CHF (congestive heart failure) ([place])   . DM (diabetes mellitus) ([place])   . Dyslipidemia   . Gout   . Morbid obesity ([place])   . Systemic hypertension     Past Surgical History:  Procedure Laterality Date  . BACK SURGERY    . CORONARY ANGIOPLASTY WITH STENT  PLACEMENT  12/26/2000   80% prox. circumflex lesion w/successful stent placement.  Marland Kitchen NM MYOVIEW LTD  08/31/2010   Normal  . PERICARDIOCENTESIS    . PROSTATE SURGERY    . US ECHOCARDIOGRAPHY  08/31/2010   EF 50-55%,RV mildly dilated,mild aortic root dilatation    Current Medications: Outpatient Medications Prior to Visit  Medication Sig Dispense Refill  . allopurinol (ZYLOPRIM) 300 MG tablet 1 TABLET ONCE A DAY ORALLY 30 DAYS  3  . aspirin 81 MG tablet Take 81 mg by mouth daily.    . colchicine 0.6 MG tablet Take 0.6 mg by mouth daily as needed.     . furosemide (LASIX) 80 MG tablet Take 1 tablet (80 mg total) by mouth daily. 30 tablet 6  . insulin aspart (NOVOLOG) 100 UNIT/ML injection Inject 8 Units into the skin 3 (three) times daily before meals.     . Insulin Glargine (BASAGLAR KWIKPEN) 100 UNIT/ML SOPN Inject 28 Units into the skin at bedtime.    . iron polysaccharides (NIFEREX) 150 MG capsule Take 150 mg by mouth 2 (two) times daily.    Marland Kitchen losartan-hydrochlorothiazide (HYZAAR) 100-25 MG tablet Take 1 tablet by mouth daily. 90 tablet 3  . pantoprazole (PROTONIX) 40 MG tablet 1 TABLET ONCE A DAY ORALLY 90 DAYS  0  . potassium chloride SA (K-DUR,KLOR-CON) 20 MEQ tablet Take 20 mEq by mouth daily.    Marland Kitchen  Travoprost, BAK Free, (TRAVATAN) 0.004 % SOLN ophthalmic solution Place 1 drop into both eyes at bedtime.    . vitamin B-12 (CYANOCOBALAMIN) 100 MCG tablet Take 100 mcg by mouth daily.    Marland Kitchen amLODipine (NORVASC) 5 MG tablet Take 1 tablet (5 mg total) by mouth daily. 90 tablet 3   No facility-administered medications prior to visit.      Allergies:   Ivp dye [iodinated diagnostic agents]   Social History   Socioeconomic History  . Marital status: Married    Spouse name: Not on file  . Number of children: Not on file  . Years of education: Not on file  . Highest education level: Not on file  Occupational History  . Not on file  Social Needs  . Financial resource strain: Not on  file  . Food insecurity:    Worry: Not on file    Inability: Not on file  . Transportation needs:    Medical: Not on file    Non-medical: Not on file  Tobacco Use  . Smoking status: Former Smoker    Last attempt to quit: 05/16/1993    Years since quitting: 24.3  . Smokeless tobacco: Never Used  Substance and Sexual Activity  . Alcohol use: Yes    Comment: seldom  . Drug use: No  . Sexual activity: Not on file  Lifestyle  . Physical activity:    Days per week: Not on file    Minutes per session: Not on file  . Stress: Not on file  Relationships  . Social connections:    Talks on phone: Not on file    Gets together: Not on file    Attends religious service: Not on file    Active member of club or organization: Not on file    Attends meetings of clubs or organizations: Not on file    Relationship status: Not on file  Other Topics Concern  . Not on file  Social History Narrative  . Not on file     Family History:  The patient's family history includes Diabetes in his brother; Hypertension in his sister.   ROS:   Please see the history of present illness.    ROS All other systems reviewed and are negative.   PHYSICAL EXAM:   VS:  BP (!) 100/50   Pulse 70   Ht 6\' 1"  (1.854 m)   Wt 289 lb 3.2 oz (131.2 kg)   BMI 38.16 kg/m      General: Alert, oriented x3, no distress, severely obese Head: no evidence of trauma, PERRL, EOMI, no exophtalmos or lid lag, no myxedema, no xanthelasma; normal ears, nose and oropharynx Neck: normal jugular venous pulsations and no hepatojugular reflux; brisk carotid pulses without delay and no carotid bruits Chest: clear to auscultation, no signs of consolidation by percussion or palpation, normal fremitus, symmetrical and full respiratory excursions Cardiovascular: normal position and quality of the apical impulse, regular rhythm, normal first and second heart sounds, no murmurs, rubs or gallops Abdomen: no tenderness or distention, no  masses by palpation, no abnormal pulsatility or arterial bruits, normal bowel sounds, no hepatosplenomegaly Extremities: no clubbing, cyanosis, trivial ankle bilateral edema; 2+ radial, ulnar and brachial pulses bilaterally; 2+ right femoral, posterior tibial and dorsalis pedis pulses; 2+ left femoral, posterior tibial and dorsalis pedis pulses; no subclavian or femoral bruits Neurological: grossly nonfocal Psych: Normal mood and affect     Wt Readings from Last 3 Encounters:  09/13/17 289 lb 3.2 oz (  131.2 kg)  08/01/17 296 lb 11.2 oz (134.6 kg)  02/07/17 (!) 312 lb (141.5 kg)      Studies/Labs Reviewed:   EKG:  EKG is ordered today.  The ekg ordered today demonstrates sinus rhythm, mild first-degree AV block, QTC 470 ms, no ischemic changes.  BMET    Component Value Date/Time   NA 142 08/01/2017 1223   NA 139 02/01/2017 0829   K 3.9 08/01/2017 1223   K 4.2 02/01/2017 0829   CL 109 08/01/2017 1223   CO2 25 08/01/2017 1223   CO2 22 02/01/2017 0829   GLUCOSE 54 (L) 08/01/2017 1223   GLUCOSE 167 (H) 02/01/2017 0829   BUN 25 08/01/2017 1223   BUN 36.1 (H) 02/01/2017 0829   CREATININE 1.68 (H) 08/01/2017 1223   CREATININE 1.5 (H) 02/01/2017 0829   CALCIUM 9.3 08/01/2017 1223   CALCIUM 9.4 02/01/2017 0829   GFRNONAA 37 (L) 08/01/2017 1223   GFRAA 43 (L) 08/01/2017 1223     total cholesterol 109, HDL 47, LDL 50, triglycerides 61 (May 2018).  ASSESSMENT:    1. Chronic diastolic heart failure ([place])   2. Severe hypertension      PLAN:  In order of problems listed above:  1. CHF: NYHA functional class I, trivial edema, on a moderate dose of diuretic.  Preserved LVEF 2. CAD: Asymptomatic since his revascularization 17 years ago. 3. HTN: Over the years we have had to reduce his beta-blocker since it caused bradycardia.  Heart rate is okay today.  His blood pressure is quite low and he is symptomatic.  He has lost a lot of weight over the last several years, although he is  still severely obese.  We will cut down his dose of amlodipine further.  Asked him to send some blood pressure recordings in a couple of weeks and will decide whether this medication needs to be stopped altogether.  4. HLP: Excellent lipid profile on statin therapy 5. DM: He tells me that his glycemic control is good.. 6. Obesity: Congratulated him on steady weight loss.  He is no longer morbidly obese.  He is already seeing the benefits of weight loss with reduction in hypertension and fewer problems with leg edema.  Medication Adjustments/Labs and Tests Ordered: Current medicines are reviewed at length with the patient today.  Concerns regarding medicines are outlined above.  Medication changes, Labs and Tests ordered today are listed in the Patient Instructions below. Patient Instructions  Dr Sallyanne Kuster has recommended making the following medication changes: 1. DECREASE Amlodipine to 2.5 mg daily  Your physician has requested that you regularly monitor your blood pressure at home. Please use the same machine to check your blood pressure daily. Keep a record of your blood pressures using the log sheet provided. In 2 weeks, please report your readings back to Dr C. You may use our online patient portal 'MyChart' or you can call the office to speak with a nurse. You can fax these to [phone] attention: [place].  Dr Sallyanne Kuster recommends that you schedule a follow-up appointment in 6 months. You will receive a reminder letter in the mail two months in advance. If you don't receive a letter, please call our office to schedule the follow-up appointment.  If you need a refill on your cardiac medications before your next appointment, please call your pharmacy.    Signed, Roger Klein, MD  09/15/2017 1:22 PM    [facility] [address], [place], [place]  [address] Phone: [phone]; Fax: [phone]

## 2017-09-15 ENCOUNTER — Encounter: Payer: Self-pay | Admitting: Cardiovascular Disease

## 2017-09-27 DIAGNOSIS — M1A00X0 Idiopathic chronic gout, unspecified site, without tophus (tophi): Secondary | ICD-10-CM | POA: Diagnosis not present

## 2017-09-27 DIAGNOSIS — E1121 Type 2 diabetes mellitus with diabetic nephropathy: Secondary | ICD-10-CM | POA: Diagnosis not present

## 2017-09-28 DIAGNOSIS — E7800 Pure hypercholesterolemia, unspecified: Secondary | ICD-10-CM | POA: Diagnosis not present

## 2017-09-28 DIAGNOSIS — E1121 Type 2 diabetes mellitus with diabetic nephropathy: Secondary | ICD-10-CM | POA: Diagnosis not present

## 2017-09-28 DIAGNOSIS — I10 Essential (primary) hypertension: Secondary | ICD-10-CM | POA: Diagnosis not present

## 2017-10-04 DIAGNOSIS — E790 Hyperuricemia without signs of inflammatory arthritis and tophaceous disease: Secondary | ICD-10-CM | POA: Diagnosis not present

## 2017-10-04 DIAGNOSIS — N189 Chronic kidney disease, unspecified: Secondary | ICD-10-CM | POA: Diagnosis not present

## 2017-10-04 DIAGNOSIS — M109 Gout, unspecified: Secondary | ICD-10-CM | POA: Diagnosis not present

## 2017-10-04 DIAGNOSIS — D649 Anemia, unspecified: Secondary | ICD-10-CM | POA: Diagnosis not present

## 2017-10-04 DIAGNOSIS — I509 Heart failure, unspecified: Secondary | ICD-10-CM | POA: Diagnosis not present

## 2017-10-12 ENCOUNTER — Telehealth: Payer: Self-pay

## 2017-10-12 NOTE — Telephone Encounter (Signed)
As requested per last office note, patient faxed 2 weeks of blood pressure readings: 5/14 - 132/67; HR 74 5/15 - 140/70; HR 69 5/16 - 135/73; HR 72 5/17 - 122/64; HR 71 5/18 - 134/84; HR 70 5/19 - 136/71; HR 64 5/20 - 140/70; HR 67 5/21 - 138/68; HR 61 5/22 - 134/62; HR 72 5/23 - 143/69; HR 70  Amlodipine was decreased to 2.5 mg QD r/t His blood pressure is low and he has frequent episodes of dizziness.  This is especially a problem when changes in position, consistent with orthostatic hypotension.  Please review and advise.

## 2017-10-13 NOTE — Telephone Encounter (Signed)
If he is no longer dizzy, I would like to leave his meds as they are now [facility]

## 2017-10-21 DIAGNOSIS — H401111 Primary open-angle glaucoma, right eye, mild stage: Secondary | ICD-10-CM | POA: Diagnosis not present

## 2017-10-21 NOTE — Progress Notes (Signed)
Marland Kitchen    HEMATOLOGY/ONCOLOGY FOLLOW UP NOTE  Date of Service: 10/24/2017  Patient Care Team: [name], MD as PCP - General (Internal Medicine)  CHIEF COMPLAINTS/PURPOSE OF CONSULTATION:  F/u anemia   HISTORY OF PRESENTING ILLNESS:  Roger Blackwell is a wonderful 82 y.o. male who has been referred to [place] by Dr .[name], MD  for evaluation and management of Anemia.  Patient has a h/o multiple medical co-morbidities including HTN, DM2, HLD, CHF, CAD , morbid obesity, gout, prostate cancer treated in 1988 followed by Dr. Jeffie Pollock.   Patient recently had labs with his primary care physician which showed a hemoglobin of 10 with an MCV of 82 normal WBC count of 7.8k and normal platelet count of 182k. Ferritin level was 44 with an iron saturation 14%. Patient had fecal occult blood testing 3 which was negative. Nose no acute bleeding.  Has been on Lupron shots with his urologist which can also cause some anemia. Has chronic kidney disease with his last creatinine being in the 1.5-1.8 range is chronic kidney disease stage III. Patient also has history of gout and is on chronic allopurinol and takes Colichicine prn for acute gout attacks.  Patient notes no acute weight loss. Notes that his diabetes under reasonable control. No bone pains. Was previously on oral iron 15-20 years ago but not recently. No other evidence of acute bleeding or blood loss.  INTERVAL HISTORY  Patient is here for follow-up of his multifactorial anemia. The patient's last visit with [place] was on 08/01/17. He is accompanied today by his wife. The pt reports that he is doing well overall.   The pt reports feeling much better after receiving his IV iron infusion. He has lost about 40 pounds over the last year and notes that he has been eating less. He also endorses being cold frequently. He continues to take Lupron shots given his hx of prostate cancer.   Lab results today (10/24/17) of CBC, CMP, and Reticulocytes is as  follows: all values are WNL except for RBC at 3.52, HGB at 9.2, HCT at 29.6, MCH at 26.1, MCHC at 31.1, RDW at 16.9, Eosinophils Abs at 600, Glucose at 158, BUN at 29, Creatinine at 1.43, Albumin at 3.3. Iron/TIBC 10/24/17 showed 17% saturation ratio Ferritin 10/24/17 is WNL at 233 Vitamin B12 10/24/17 is 728  On review of systems, pt reports weight loss, decreased appetite, good energy levels, feeling cold, and denies changes in his bowel habits, and any other symptoms.   MEDICAL HISTORY:  Past Medical History:  Diagnosis Date  . CAD (coronary artery disease)   . CHF (congestive heart failure) ([place])   . DM (diabetes mellitus) ([place])   . Dyslipidemia   . Gout   . Morbid obesity ([place])   . Systemic hypertension   History of adenomatous polyps Prostate cancer diagnosed in 1988 followed by Dr. Jeffie Pollock Hypertension, coronary artery disease history of positive PPD Doses Heart appearing Acanthosis nigricans COPD Erectile dysfunction Male hypogonadism Chronic kidney disease  SURGICAL HISTORY: Past Surgical History:  Procedure Laterality Date  . BACK SURGERY    . CORONARY ANGIOPLASTY WITH STENT PLACEMENT  12/26/2000   80% prox. circumflex lesion w/successful stent placement.  Marland Kitchen NM MYOVIEW LTD  08/31/2010   Normal  . PERICARDIOCENTESIS    . PROSTATE SURGERY    . US ECHOCARDIOGRAPHY  08/31/2010   EF 50-55%,RV mildly dilated,mild aortic root dilatation    SOCIAL HISTORY: Social History   Socioeconomic History  . Marital status: Married  Spouse name: Not on file  . Number of children: Not on file  . Years of education: Not on file  . Highest education level: Not on file  Occupational History  . Not on file  Social Needs  . Financial resource strain: Not on file  . Food insecurity:    Worry: Not on file    Inability: Not on file  . Transportation needs:    Medical: Not on file    Non-medical: Not on file  Tobacco Use  . Smoking status: Former Smoker    Last attempt to quit:  05/16/1993    Years since quitting: 24.4  . Smokeless tobacco: Never Used  Substance and Sexual Activity  . Alcohol use: Yes    Comment: seldom  . Drug use: No  . Sexual activity: Not on file  Lifestyle  . Physical activity:    Days per week: Not on file    Minutes per session: Not on file  . Stress: Not on file  Relationships  . Social connections:    Talks on phone: Not on file    Gets together: Not on file    Attends religious service: Not on file    Active member of club or organization: Not on file    Attends meetings of clubs or organizations: Not on file    Relationship status: Not on file  . Intimate partner violence:    Fear of current or ex partner: Not on file    Emotionally abused: Not on file    Physically abused: Not on file    Forced sexual activity: Not on file  Other Topics Concern  . Not on file  Social History Narrative  . Not on file    FAMILY HISTORY: Family History  Problem Relation Age of Onset  . Diabetes Brother   . Hypertension Sister     ALLERGIES:  is allergic to ivp dye [iodinated diagnostic agents].  MEDICATIONS:  Current Outpatient Medications  Medication Sig Dispense Refill  . allopurinol (ZYLOPRIM) 300 MG tablet 1 TABLET ONCE A DAY ORALLY 30 DAYS  3  . amLODipine (NORVASC) 2.5 MG tablet Take 1 tablet (2.5 mg total) by mouth daily. 90 tablet 3  . aspirin 81 MG tablet Take 81 mg by mouth daily.    . colchicine 0.6 MG tablet Take 0.6 mg by mouth daily as needed.     . furosemide (LASIX) 80 MG tablet Take 1 tablet (80 mg total) by mouth daily. 30 tablet 6  . insulin aspart (NOVOLOG) 100 UNIT/ML injection Inject 8 Units into the skin 3 (three) times daily before meals.     . Insulin Glargine (BASAGLAR KWIKPEN) 100 UNIT/ML SOPN Inject 28 Units into the skin at bedtime.    . iron polysaccharides (NIFEREX) 150 MG capsule Take 150 mg by mouth 2 (two) times daily.    Marland Kitchen losartan-hydrochlorothiazide (HYZAAR) 100-25 MG tablet Take 1 tablet by  mouth daily. 90 tablet 3  . pantoprazole (PROTONIX) 40 MG tablet 1 TABLET ONCE A DAY ORALLY 90 DAYS  0  . potassium chloride SA (K-DUR,KLOR-CON) 20 MEQ tablet Take 20 mEq by mouth daily.    . Travoprost, BAK Free, (TRAVATAN) 0.004 % SOLN ophthalmic solution Place 1 drop into both eyes at bedtime.    . vitamin B-12 (CYANOCOBALAMIN) 100 MCG tablet Take 100 mcg by mouth daily.     No current facility-administered medications for this visit.     REVIEW OF SYSTEMS:    A 10+ POINT  REVIEW OF SYSTEMS WAS OBTAINED including neurology, dermatology, psychiatry, cardiac, respiratory, lymph, extremities, GI, GU, Musculoskeletal, constitutional, breasts, reproductive, HEENT.  All pertinent positives are noted in the HPI.  All others are negative.   PHYSICAL EXAMINATION:  ECOG PERFORMANCE STATUS: 2 - Symptomatic, <50% confined to bed  . Vitals:   10/24/17 1053  BP: (!) 131/56  Pulse: 69  Resp: 18  Temp: 97.7 F (36.5 C)  SpO2: 98%   Filed Weights   10/24/17 1053  Weight: 292 lb 6.4 oz (132.6 kg)   .Body mass index is 38.58 kg/m.  GENERAL:alert, in no acute distress and comfortable SKIN: no acute rashes, no significant lesions EYES: conjunctiva are pink and non-injected, sclera anicteric OROPHARYNX: MMM, no exudates, no oropharyngeal erythema or ulceration NECK: supple, no JVD LYMPH:  no palpable lymphadenopathy in the cervical, axillary or inguinal regions LUNGS: clear to auscultation b/l with normal respiratory effort HEART: regular rate & rhythm ABDOMEN:  normoactive bowel sounds , non tender, not distended. Extremity: 1+ pedal edema PSYCH: alert & oriented x 3 with fluent speech NEURO: no focal motor/sensory deficits  =  LABORATORY DATA:  I have reviewed the data as listed  . CBC Latest Ref Rng & Units 10/24/2017 08/01/2017 02/01/2017  WBC 4.0 - 10.3 K/uL 9.3 9.9 9.4  Hemoglobin 13.0 - 17.1 g/dL 9.2(L) 9.3(L) 9.6(L)  Hematocrit 38.4 - 49.9 % 29.6(L) 29.5(L) 30.6(L)  Platelets  140 - 400 K/uL 189 205 183  HGB 9.2  . CMP Latest Ref Rng & Units 10/24/2017 08/01/2017 02/01/2017  Glucose 70 - 140 mg/dL 158(H) 54(L) 167(H)  BUN 7 - 26 mg/dL 29(H) 25 36.1(H)  Creatinine 0.70 - 1.30 mg/dL 1.43(H) 1.68(H) 1.5(H)  Sodium 136 - 145 mmol/L 138 142 139  Potassium 3.5 - 5.1 mmol/L 4.2 3.9 4.2  Chloride 98 - 109 mmol/L 108 109 -  CO2 22 - 29 mmol/L 25 25 22   Calcium 8.4 - 10.4 mg/dL 9.5 9.3 9.4  Total Protein 6.4 - 8.3 g/dL 6.9 7.0 7.1  Total Bilirubin 0.2 - 1.2 mg/dL 0.3 0.3 0.27  Alkaline Phos 40 - 150 U/L 77 75 89  AST 5 - 34 U/L 22 23 20   ALT 0 - 55 U/L 16 26 16    B12  -285 . Lab Results  Component Value Date   LDH 198 06/28/2016   . Lab Results  Component Value Date   IRON 38 (L) 10/24/2017   TIBC 224 10/24/2017   IRONPCTSAT 17 (L) 10/24/2017   (Iron and TIBC)  Lab Results  Component Value Date   FERRITIN 233 10/24/2017   Component     Latest Ref Rng & Units 08/01/2017  Iron     42 - 163 ug/dL 22 (L)  TIBC     202 - 409 ug/dL 228  Saturation Ratios     42 - 163 % 10 (L)  UIBC     ug/dL 205  Vitamin B12     180 - 914 pg/mL 831  Ferritin     22 - 316 ng/mL 107         RADIOGRAPHIC STUDIES: I have personally reviewed the radiological images as listed and agreed with the findings in the report. No results found.  ASSESSMENT & PLAN:   82 y.o. morbidly obese gentleman with multiple medical co-morbidities as noted above with   1) Normocytic/Borderline Microcytic Anemia This appears to be likely multifactorial.  - some element of iron deficiency and the ferritin of 23 and iron saturation  of 17% has previously imrpoved to ferritin of 53 with PO iron replacement. This is fairly low in the setting of chronic kidney disease whether goals would be ferritin of more than 100 and iron saturation of more than 30%. -Patient notes that he is on Lupron shots and this could cause a drop of 1 g to 2 g of hemoglobin from baseline due to decreased  testosterone levels. -His chronic kidney disease is also causing an element of anemia of chronic disease. -Myeloma panel shows no M spike. Some increase in serum free light chains likely due to decreased renal clearance. -Vitamin B12 levels borderline low at 285- improved to 563 with replacement -Normal LDH suggest against hemolysis. No overt GI bleeding. He could have some decreased absorption due to chronic PPI use.  Plan Discussed pt labwork today, 10/24/17; Ferritin at 233, Hgb stable at 9.2, Creatinine at 1.43 -If Hgb continues to drop, would order Erythropoietin shot, informed by his CKD. Discussed that this would only be warranted if Hgb drops significantly, given his hx of prostate cancer.  -Will continue to watch blood counts in the interim -Continue sublingual Vitamin B12 replacement -No indication for PRBC transfusion or IV Iron at this time -Will see pt back in 4 months -Goal to keep ferritin close to 200 - at goal currently -Advised pt to let us know if he develops any acute lightheadedness, dizziness or fatigue as he may need a transfusion.  -Continue follow-up with primary care physician for management of other medical comorbidities. -    RTC with Dr Irene Limbo in 4 months with labs   All of the patients questions were answered with apparent satisfaction. The patient knows to call the clinic with any problems, questions or concerns.  The toal time spent in the appt was 20 minutes and more than 50% was on counseling and direct patient cares.      Sullivan Lone MD MS AAHIVMS [facility] Hematology/Oncology Physician [facility]  (Office):       [phone] (Work cell):  [phone] (Fax):           [phone]  10/24/2017 11:44 AM  I, Baldwin [place], am acting as a Education administrator for Dr Irene Limbo.   .I have reviewed the above documentation for accuracy and completeness, and I agree with the above. Brunetta Genera MD

## 2017-10-21 NOTE — Telephone Encounter (Signed)
lmtcb

## 2017-10-24 ENCOUNTER — Telehealth: Payer: Self-pay | Admitting: Hematology

## 2017-10-24 ENCOUNTER — Inpatient Hospital Stay: Payer: Medicare Other

## 2017-10-24 ENCOUNTER — Inpatient Hospital Stay: Payer: Medicare Other | Attending: Hematology | Admitting: Hematology

## 2017-10-24 VITALS — BP 131/56 | HR 69 | Temp 97.7°F | Resp 18 | Ht 73.0 in | Wt 292.4 lb

## 2017-10-24 DIAGNOSIS — Z7982 Long term (current) use of aspirin: Secondary | ICD-10-CM | POA: Insufficient documentation

## 2017-10-24 DIAGNOSIS — D509 Iron deficiency anemia, unspecified: Secondary | ICD-10-CM

## 2017-10-24 DIAGNOSIS — D638 Anemia in other chronic diseases classified elsewhere: Secondary | ICD-10-CM | POA: Diagnosis not present

## 2017-10-24 DIAGNOSIS — E1122 Type 2 diabetes mellitus with diabetic chronic kidney disease: Secondary | ICD-10-CM | POA: Diagnosis not present

## 2017-10-24 DIAGNOSIS — R634 Abnormal weight loss: Secondary | ICD-10-CM

## 2017-10-24 DIAGNOSIS — I130 Hypertensive heart and chronic kidney disease with heart failure and stage 1 through stage 4 chronic kidney disease, or unspecified chronic kidney disease: Secondary | ICD-10-CM | POA: Diagnosis not present

## 2017-10-24 DIAGNOSIS — M109 Gout, unspecified: Secondary | ICD-10-CM

## 2017-10-24 DIAGNOSIS — D508 Other iron deficiency anemias: Secondary | ICD-10-CM

## 2017-10-24 DIAGNOSIS — E538 Deficiency of other specified B group vitamins: Secondary | ICD-10-CM | POA: Insufficient documentation

## 2017-10-24 DIAGNOSIS — Z8546 Personal history of malignant neoplasm of prostate: Secondary | ICD-10-CM

## 2017-10-24 DIAGNOSIS — D649 Anemia, unspecified: Secondary | ICD-10-CM

## 2017-10-24 DIAGNOSIS — Z79899 Other long term (current) drug therapy: Secondary | ICD-10-CM | POA: Diagnosis not present

## 2017-10-24 DIAGNOSIS — Z794 Long term (current) use of insulin: Secondary | ICD-10-CM | POA: Insufficient documentation

## 2017-10-24 DIAGNOSIS — Z87891 Personal history of nicotine dependence: Secondary | ICD-10-CM

## 2017-10-24 DIAGNOSIS — N183 Chronic kidney disease, stage 3 (moderate): Secondary | ICD-10-CM | POA: Insufficient documentation

## 2017-10-24 LAB — CMP (CANCER CENTER ONLY)
ALBUMIN: 3.3 g/dL — AB (ref 3.5–5.0)
ALK PHOS: 77 U/L (ref 40–150)
ALT: 16 U/L (ref 0–55)
ANION GAP: 5 (ref 3–11)
AST: 22 U/L (ref 5–34)
BILIRUBIN TOTAL: 0.3 mg/dL (ref 0.2–1.2)
BUN: 29 mg/dL — ABNORMAL HIGH (ref 7–26)
CALCIUM: 9.5 mg/dL (ref 8.4–10.4)
CO2: 25 mmol/L (ref 22–29)
Chloride: 108 mmol/L (ref 98–109)
Creatinine: 1.43 mg/dL — ABNORMAL HIGH (ref 0.70–1.30)
GFR, EST AFRICAN AMERICAN: 52 mL/min — AB (ref >60)
GFR, Estimated: 45 mL/min — ABNORMAL LOW (ref >60)
GLUCOSE: 158 mg/dL — AB (ref 70–140)
POTASSIUM: 4.2 mmol/L (ref 3.5–5.1)
SODIUM: 138 mmol/L (ref 136–145)
TOTAL PROTEIN: 6.9 g/dL (ref 6.4–8.3)

## 2017-10-24 LAB — CBC WITH DIFFERENTIAL/PLATELET
BASOS ABS: 0 10*3/uL (ref 0.0–0.1)
BASOS PCT: 0 %
Eosinophils Absolute: 0.6 10*3/uL — ABNORMAL HIGH (ref 0.0–0.5)
Eosinophils Relative: 7 %
HEMATOCRIT: 29.6 % — AB (ref 38.4–49.9)
HEMOGLOBIN: 9.2 g/dL — AB (ref 13.0–17.1)
LYMPHS PCT: 18 %
Lymphs Abs: 1.7 10*3/uL (ref 0.9–3.3)
MCH: 26.1 pg — ABNORMAL LOW (ref 27.2–33.4)
MCHC: 31.1 g/dL — ABNORMAL LOW (ref 32.0–36.0)
MCV: 84.1 fL (ref 79.3–98.0)
MONO ABS: 0.6 10*3/uL (ref 0.1–0.9)
Monocytes Relative: 7 %
NEUTROS ABS: 6.3 10*3/uL (ref 1.5–6.5)
NEUTROS PCT: 68 %
Platelets: 189 10*3/uL (ref 140–400)
RBC: 3.52 MIL/uL — AB (ref 4.20–5.82)
RDW: 16.9 % — ABNORMAL HIGH (ref 11.0–14.6)
WBC: 9.3 10*3/uL (ref 4.0–10.3)

## 2017-10-24 LAB — IRON AND TIBC
Iron: 38 ug/dL — ABNORMAL LOW (ref 42–163)
SATURATION RATIOS: 17 % — AB (ref 42–163)
TIBC: 224 ug/dL (ref 202–409)
UIBC: 186 ug/dL

## 2017-10-24 LAB — RETICULOCYTES
RBC.: 3.52 MIL/uL — AB (ref 4.20–5.82)
RETIC COUNT ABSOLUTE: 38.7 10*3/uL (ref 34.8–93.9)
Retic Ct Pct: 1.1 % (ref 0.8–1.8)

## 2017-10-24 LAB — FERRITIN: Ferritin: 233 ng/mL (ref 22–316)

## 2017-10-24 LAB — VITAMIN B12: VITAMIN B 12: 728 pg/mL (ref 180–914)

## 2017-10-24 NOTE — Telephone Encounter (Signed)
Scheduled appt per 6/10 los -gave patient aVS and calender per los.

## 2017-11-03 DIAGNOSIS — C61 Malignant neoplasm of prostate: Secondary | ICD-10-CM | POA: Diagnosis not present

## 2017-11-09 DIAGNOSIS — R9721 Rising PSA following treatment for malignant neoplasm of prostate: Secondary | ICD-10-CM | POA: Diagnosis not present

## 2017-11-09 DIAGNOSIS — C775 Secondary and unspecified malignant neoplasm of intrapelvic lymph nodes: Secondary | ICD-10-CM | POA: Diagnosis not present

## 2017-11-09 DIAGNOSIS — C61 Malignant neoplasm of prostate: Secondary | ICD-10-CM | POA: Diagnosis not present

## 2017-11-21 ENCOUNTER — Encounter (HOSPITAL_COMMUNITY): Payer: Self-pay | Admitting: Emergency Medicine

## 2017-11-21 ENCOUNTER — Ambulatory Visit (HOSPITAL_COMMUNITY)
Admission: EM | Admit: 2017-11-21 | Discharge: 2017-11-21 | Disposition: A | Discharge status: Home / Self Care | Payer: Medicare Other | Attending: Family Medicine | Admitting: Family Medicine

## 2017-11-21 DIAGNOSIS — W19XXXA Unspecified fall, initial encounter: Secondary | ICD-10-CM

## 2017-11-21 DIAGNOSIS — S50812A Abrasion of left forearm, initial encounter: Secondary | ICD-10-CM | POA: Diagnosis not present

## 2017-11-21 DIAGNOSIS — S80212A Abrasion, left knee, initial encounter: Secondary | ICD-10-CM

## 2017-11-21 MED ORDER — BACITRACIN ZINC 500 UNIT/GM EX OINT
TOPICAL_OINTMENT | CUTANEOUS | Status: AC
  Filled 2017-11-21: qty 3.6

## 2017-11-21 NOTE — ED Provider Notes (Signed)
[facility]    CSN: [id number] Arrival date & time: 11/21/17  1246     History   Chief Complaint Chief Complaint  Patient presents with  . Fall    HPI Roger Blackwell is a 82 y.o. male.   Pt is a 82 year old male that presents for fall. This occurred today while he was walking to his car. He stumbled and fell striking left forearm and left knee on the ground. Denies hitting head. He denies any dizziness prior to fall.      Past Medical History:  Diagnosis Date  . CAD (coronary artery disease)   . CHF (congestive heart failure) ([place])   . DM (diabetes mellitus) ([place])   . Dyslipidemia   . Gout   . Morbid obesity ([place])   . Systemic hypertension     Patient Active Problem List   Diagnosis Date Noted  . Iron deficiency anemia 08/10/2017  . Dyslipidemia 12/30/2014  . CAD s/p left circumflex coronary stents 2002 05/25/2013  . Class 2 severe obesity due to excess calories with serious comorbidity and body mass index (BMI) of 38.0 to 38.9 in adult ([place]) 05/25/2013  . Chronic diastolic heart failure ([place]) 05/25/2013  . Hypercholesterolemia 05/25/2013  . Severe hypertension 05/25/2013  . Glaucoma 05/25/2013  . Type 2 diabetes mellitus with stage 3 chronic kidney disease, with long-term current use of insulin ([place]) 05/25/2013    Past Surgical History:  Procedure Laterality Date  . BACK SURGERY    . CORONARY ANGIOPLASTY WITH STENT PLACEMENT  12/26/2000   80% prox. circumflex lesion w/successful stent placement.  Marland Kitchen NM MYOVIEW LTD  08/31/2010   Normal  . PERICARDIOCENTESIS    . PROSTATE SURGERY    . US ECHOCARDIOGRAPHY  08/31/2010   EF 50-55%,RV mildly dilated,mild aortic root dilatation       Home Medications    Prior to Admission medications   Medication Sig Start Date End Date Taking? Authorizing Provider  allopurinol (ZYLOPRIM) 300 MG tablet 1 TABLET ONCE A DAY ORALLY 30 DAYS 09/15/16   [provider]  amLODipine (NORVASC) 2.5 MG tablet Take 1  tablet (2.5 mg total) by mouth daily. 09/13/17 09/08/18  Croitoru, Mihai, MD  aspirin 81 MG tablet Take 81 mg by mouth daily.    [provider]  colchicine 0.6 MG tablet Take 0.6 mg by mouth daily as needed.     [provider]  furosemide (LASIX) 80 MG tablet Take 1 tablet (80 mg total) by mouth daily. 05/22/13   Croitoru, Mihai, MD  insulin aspart (NOVOLOG) 100 UNIT/ML injection Inject 8 Units into the skin 3 (three) times daily before meals.     [provider]  Insulin Glargine (BASAGLAR KWIKPEN) 100 UNIT/ML SOPN Inject 28 Units into the skin at bedtime.    [provider]  iron polysaccharides (NIFEREX) 150 MG capsule Take 150 mg by mouth 2 (two) times daily.    [provider]  losartan-hydrochlorothiazide (HYZAAR) 100-25 MG tablet Take 1 tablet by mouth daily. 05/30/17   Croitoru, Mihai, MD  pantoprazole (PROTONIX) 40 MG tablet 1 TABLET ONCE A DAY ORALLY 90 DAYS 06/04/16   [provider]  potassium chloride SA (K-DUR,KLOR-CON) 20 MEQ tablet Take 20 mEq by mouth daily.    [provider]  Travoprost, BAK Free, (TRAVATAN) 0.004 % SOLN ophthalmic solution Place 1 drop into both eyes at bedtime.    [provider]  vitamin B-12 (CYANOCOBALAMIN) 100 MCG tablet Take 100 mcg by  mouth daily.    [provider]    Family History Family History  Problem Relation Age of Onset  . Diabetes Brother   . Hypertension Sister     Social History Social History   Tobacco Use  . Smoking status: Former Smoker    Last attempt to quit: 05/16/1993    Years since quitting: 24.5  . Smokeless tobacco: Never Used  Substance Use Topics  . Alcohol use: Yes    Comment: seldom  . Drug use: No     Allergies   Ivp dye [iodinated diagnostic agents]   Review of Systems Review of Systems  Constitutional: Negative for fatigue.  Musculoskeletal: Positive for gait problem. Negative for arthralgias, myalgias and neck pain.  Skin:  Positive for wound.  Neurological: Negative for dizziness and light-headedness.  Psychiatric/Behavioral: Negative for confusion.  All other systems reviewed and are negative.    Physical Exam Triage Vital Signs ED Triage Vitals [11/21/17 1322]  Enc Vitals Group     BP (!) 131/43     Pulse Rate 87     Resp 18     Temp 98.1 F (36.7 C)     Temp Source Oral     SpO2 100 %     Weight      Height      Head Circumference      Peak Flow      Pain Score      Pain Loc      Pain Edu?      Excl. in [place]?    No data found.  Updated Vital Signs BP (!) 131/43 (BP Location: Left Arm)   Pulse 87   Temp 98.1 F (36.7 C) (Oral)   Resp 18   SpO2 100%   Visual Acuity Right Eye Distance:   Left Eye Distance:   Bilateral Distance:    Right Eye Near:   Left Eye Near:    Bilateral Near:     Physical Exam  Constitutional: He is oriented to person, place, and time. He appears well-developed and well-nourished.  HENT:  Head: Normocephalic and atraumatic.  Nose: Nose normal.  Eyes: Pupils are equal, round, and reactive to light. Conjunctivae and EOM are normal.  Neck: Normal range of motion. Neck supple.  Cardiovascular: Normal rate and regular rhythm.  Pulmonary/Chest: Effort normal.  Abdominal: Soft.  Musculoskeletal: Normal range of motion.  Normal rotation,  flexion and extension to left shoulder, elbow, wrist. Normal flexion, extension, internal and external rotation to left knee. No obvious swelling or deformities.   Neurological: He is alert and oriented to person, place, and time.  Skin: Skin is warm and dry.  Abrasion to the left posterior forearm and left knee.      UC Treatments / Results  Labs (all labs ordered are listed, but only abnormal results are displayed) Labs Reviewed - No data to display  EKG None  Radiology No results found.  Procedures Procedures (including critical care time)  Medications Ordered in UC Medications - No data to  display  Initial Impression / Assessment and Plan / UC Course  I have reviewed the triage vital signs and the nursing notes.  Pertinent labs & imaging results that were available during my care of the patient were reviewed by me and considered in my medical decision making (see chart for details).    Fall. X rays are not warranted due to pt ROM and no obvious swelling, significant pain or deformity. Cleaned wounds, placed bacitracin  and wrapped with kerlex. Told to follow up with PCP as needed. Tylenol or motrin for pain.  Final Clinical Impressions(s) / UC Diagnoses   Final diagnoses:  Fall, initial encounter     Discharge Instructions     It was nice meeting you!!  We cleaned and wrapped your wounds today in the clinic. You may continue to do this at home and apply bacitracin to the wound. You are able to move everything so I am not concerned that you broke any bones. You may take ibuprofen 600 mg every 6 hours or tylenol as needed for the pain. Follow up with PCP as needed.    ED Prescriptions    None     Controlled Substance Prescriptions [place] Controlled Substance Registry consulted? Not Applicable   Orvan July, NP 11/21/17 2233

## 2017-11-21 NOTE — Discharge Instructions (Addendum)
It was nice meeting you!!  We cleaned and wrapped your wounds today in the clinic. You may continue to do this at home and apply bacitracin to the wound. You are able to move everything so I am not concerned that you broke any bones. You may take ibuprofen 600 mg every 6 hours or tylenol as needed for the pain. Follow up with PCP as needed.

## 2017-11-21 NOTE — ED Triage Notes (Signed)
Pt here for fall this am with abrasion to left arm and knee; pt denies hitting head but sts did feel dizzy prior to fall

## 2017-12-26 ENCOUNTER — Telehealth: Payer: Self-pay | Admitting: Cardiovascular Disease

## 2017-12-26 DIAGNOSIS — E7800 Pure hypercholesterolemia, unspecified: Secondary | ICD-10-CM | POA: Diagnosis not present

## 2017-12-26 DIAGNOSIS — E1121 Type 2 diabetes mellitus with diabetic nephropathy: Secondary | ICD-10-CM | POA: Diagnosis not present

## 2017-12-26 NOTE — Telephone Encounter (Signed)
Pt had side effects from amlodipine--dizzy, light headed, SOB-swelling feet and ankles-pt hasn't taken 2-3 day-symptoms are better-pls advise [phone] or [phone]

## 2017-12-26 NOTE — Telephone Encounter (Signed)
LMTCB

## 2017-12-27 NOTE — Telephone Encounter (Signed)
Pt aware of recommendations and will call early next week with B/P readings ./cy

## 2017-12-27 NOTE — Telephone Encounter (Signed)
Spoke with pt and pt stopped Amlodipine 4 days ago and has no dizziness or swelling B/p yesterday was 134/75.Instructed pt to keep B/p log and call next week with readings.Will forward to Dr Sallyanne Kuster for review and recommendations ./cy

## 2017-12-27 NOTE — Telephone Encounter (Signed)
Please let him know that it may take up to 10-14 days for complete washout of the amlodipine effect on BP. Please send [place] BP readings early next week. MCr

## 2017-12-28 ENCOUNTER — Other Ambulatory Visit: Payer: Self-pay | Admitting: Internal Medicine

## 2017-12-28 ENCOUNTER — Ambulatory Visit
Admission: RE | Admit: 2017-12-28 | Discharge: 2017-12-28 | Disposition: A | Discharge status: Home / Self Care | Payer: Medicare Other | Source: Ambulatory Visit | Attending: Internal Medicine | Admitting: Internal Medicine

## 2017-12-28 DIAGNOSIS — R2242 Localized swelling, mass and lump, left lower limb: Secondary | ICD-10-CM | POA: Diagnosis not present

## 2017-12-28 DIAGNOSIS — E1121 Type 2 diabetes mellitus with diabetic nephropathy: Secondary | ICD-10-CM | POA: Diagnosis not present

## 2017-12-28 DIAGNOSIS — I10 Essential (primary) hypertension: Secondary | ICD-10-CM | POA: Diagnosis not present

## 2017-12-28 DIAGNOSIS — R2243 Localized swelling, mass and lump, lower limb, bilateral: Secondary | ICD-10-CM

## 2018-01-04 ENCOUNTER — Telehealth: Payer: Self-pay | Admitting: Cardiovascular Disease

## 2018-01-04 NOTE — Telephone Encounter (Signed)
New Message:     8/14  117/54    HR 54 8/15  134/75   HR: 55 8/16  117/64   HR 64 8/17  126/65   HR 65 8/18  132/80   HR Unknown  8/19   26/62    HR Unknown  8/20   122/68  HR Unknown

## 2018-01-04 NOTE — Telephone Encounter (Signed)
Returned call to wife-patient is feeling well, symptoms have resolved (see phone note 8/12).   Advised BP and HR readings look stable and to continue to monitor.    Will route to MD to review.   Wife aware and verbalized understanding.

## 2018-01-05 DIAGNOSIS — L309 Dermatitis, unspecified: Secondary | ICD-10-CM | POA: Diagnosis not present

## 2018-01-05 DIAGNOSIS — R229 Localized swelling, mass and lump, unspecified: Secondary | ICD-10-CM | POA: Diagnosis not present

## 2018-01-05 NOTE — Telephone Encounter (Signed)
Agree those readings look optimal

## 2018-01-25 DIAGNOSIS — I872 Venous insufficiency (chronic) (peripheral): Secondary | ICD-10-CM | POA: Diagnosis not present

## 2018-01-25 DIAGNOSIS — L308 Other specified dermatitis: Secondary | ICD-10-CM | POA: Diagnosis not present

## 2018-01-25 DIAGNOSIS — I890 Lymphedema, not elsewhere classified: Secondary | ICD-10-CM | POA: Diagnosis not present

## 2018-02-22 DIAGNOSIS — I872 Venous insufficiency (chronic) (peripheral): Secondary | ICD-10-CM | POA: Diagnosis not present

## 2018-02-23 ENCOUNTER — Telehealth: Payer: Self-pay | Admitting: Hematology

## 2018-02-23 ENCOUNTER — Encounter: Payer: Self-pay | Admitting: Hematology

## 2018-02-23 ENCOUNTER — Inpatient Hospital Stay: Payer: Medicare Other | Attending: Hematology | Admitting: Hematology

## 2018-02-23 ENCOUNTER — Inpatient Hospital Stay: Payer: Medicare Other

## 2018-02-23 VITALS — BP 134/61 | HR 68 | Temp 97.6°F | Resp 14 | Ht 73.0 in | Wt 284.8 lb

## 2018-02-23 DIAGNOSIS — D649 Anemia, unspecified: Secondary | ICD-10-CM

## 2018-02-23 DIAGNOSIS — Z8546 Personal history of malignant neoplasm of prostate: Secondary | ICD-10-CM | POA: Diagnosis not present

## 2018-02-23 DIAGNOSIS — Z7982 Long term (current) use of aspirin: Secondary | ICD-10-CM | POA: Diagnosis not present

## 2018-02-23 DIAGNOSIS — Z794 Long term (current) use of insulin: Secondary | ICD-10-CM | POA: Insufficient documentation

## 2018-02-23 DIAGNOSIS — N183 Chronic kidney disease, stage 3 (moderate): Secondary | ICD-10-CM | POA: Insufficient documentation

## 2018-02-23 DIAGNOSIS — R05 Cough: Secondary | ICD-10-CM | POA: Diagnosis not present

## 2018-02-23 DIAGNOSIS — E538 Deficiency of other specified B group vitamins: Secondary | ICD-10-CM | POA: Insufficient documentation

## 2018-02-23 DIAGNOSIS — D509 Iron deficiency anemia, unspecified: Secondary | ICD-10-CM | POA: Diagnosis not present

## 2018-02-23 DIAGNOSIS — Z79899 Other long term (current) drug therapy: Secondary | ICD-10-CM | POA: Diagnosis not present

## 2018-02-23 DIAGNOSIS — D631 Anemia in chronic kidney disease: Secondary | ICD-10-CM | POA: Insufficient documentation

## 2018-02-23 DIAGNOSIS — E1122 Type 2 diabetes mellitus with diabetic chronic kidney disease: Secondary | ICD-10-CM | POA: Insufficient documentation

## 2018-02-23 DIAGNOSIS — D508 Other iron deficiency anemias: Secondary | ICD-10-CM

## 2018-02-23 DIAGNOSIS — Z87891 Personal history of nicotine dependence: Secondary | ICD-10-CM | POA: Diagnosis not present

## 2018-02-23 DIAGNOSIS — M109 Gout, unspecified: Secondary | ICD-10-CM | POA: Insufficient documentation

## 2018-02-23 LAB — FERRITIN: Ferritin: 183 ng/mL (ref 24–336)

## 2018-02-23 LAB — IRON AND TIBC
Iron: 34 ug/dL — ABNORMAL LOW (ref 42–163)
Saturation Ratios: 15 % — ABNORMAL LOW (ref 42–163)
TIBC: 233 ug/dL (ref 202–409)
UIBC: 199 ug/dL

## 2018-02-23 LAB — CMP (CANCER CENTER ONLY)
ALBUMIN: 3.3 g/dL — AB (ref 3.5–5.0)
ALK PHOS: 75 U/L (ref 38–126)
ALT: 15 U/L (ref 0–44)
AST: 17 U/L (ref 15–41)
Anion gap: 7 (ref 5–15)
BUN: 23 mg/dL (ref 8–23)
CALCIUM: 9.7 mg/dL (ref 8.9–10.3)
CO2: 26 mmol/L (ref 22–32)
CREATININE: 1.26 mg/dL — AB (ref 0.61–1.24)
Chloride: 108 mmol/L (ref 98–111)
GFR, Est AFR Am: >60 mL/min (ref >60)
GFR, Estimated: 52 mL/min — ABNORMAL LOW (ref >60)
GLUCOSE: 150 mg/dL — AB (ref 70–99)
Potassium: 4.1 mmol/L (ref 3.5–5.1)
SODIUM: 141 mmol/L (ref 135–145)
Total Bilirubin: 0.4 mg/dL (ref 0.3–1.2)
Total Protein: 7.1 g/dL (ref 6.5–8.1)

## 2018-02-23 LAB — CBC WITH DIFFERENTIAL/PLATELET
ABS IMMATURE GRANULOCYTES: 0.03 10*3/uL (ref 0.00–0.07)
BASOS PCT: 0 %
Basophils Absolute: 0 10*3/uL (ref 0.0–0.1)
Eosinophils Absolute: 0.4 10*3/uL (ref 0.0–0.5)
Eosinophils Relative: 4 %
HCT: 31.9 % — ABNORMAL LOW (ref 39.0–52.0)
HEMOGLOBIN: 9.7 g/dL — AB (ref 13.0–17.0)
IMMATURE GRANULOCYTES: 0 %
LYMPHS ABS: 1.8 10*3/uL (ref 0.7–4.0)
LYMPHS PCT: 17 %
MCH: 25.9 pg — ABNORMAL LOW (ref 26.0–34.0)
MCHC: 30.4 g/dL (ref 30.0–36.0)
MCV: 85.1 fL (ref 80.0–100.0)
Monocytes Absolute: 0.7 10*3/uL (ref 0.1–1.0)
Monocytes Relative: 6 %
Neutro Abs: 7.7 10*3/uL (ref 1.7–7.7)
Neutrophils Relative %: 73 %
PLATELETS: 228 10*3/uL (ref 150–400)
RBC: 3.75 MIL/uL — AB (ref 4.22–5.81)
RDW: 14.7 % (ref 11.5–15.5)
WBC: 10.7 10*3/uL — ABNORMAL HIGH (ref 4.0–10.5)
nRBC: 0 % (ref 0.0–0.2)

## 2018-02-23 LAB — VITAMIN B12: Vitamin B-12: 924 pg/mL — ABNORMAL HIGH (ref 180–914)

## 2018-02-23 NOTE — Telephone Encounter (Signed)
Scheduled appt per 10/10 los - gave patient AVS and calender per los.

## 2018-02-23 NOTE — Progress Notes (Signed)
Marland Kitchen    HEMATOLOGY/ONCOLOGY FOLLOW UP NOTE  Date of Service: 02/23/2018  Patient Care Team: [name], MD as PCP - General (Internal Medicine)  CHIEF COMPLAINTS/PURPOSE OF CONSULTATION:  F/u anemia   HISTORY OF PRESENTING ILLNESS:  Roger Blackwell is a wonderful 82 y.o. male who has been referred to [place] by Dr .[name], MD  for evaluation and management of Anemia.  Patient has a h/o multiple medical co-morbidities including HTN, DM2, HLD, CHF, CAD , morbid obesity, gout, prostate cancer treated in 1988 followed by Dr. Jeffie Pollock.   Patient recently had labs with his primary care physician which showed a hemoglobin of 10 with an MCV of 82 normal WBC count of 7.8k and normal platelet count of 182k. Ferritin level was 44 with an iron saturation 14%. Patient had fecal occult blood testing 3 which was negative. Nose no acute bleeding.  Has been on Lupron shots with his urologist which can also cause some anemia. Has chronic kidney disease with his last creatinine being in the 1.5-1.8 range is chronic kidney disease stage III. Patient also has history of gout and is on chronic allopurinol and takes Colichicine prn for acute gout attacks.  Patient notes no acute weight loss. Notes that his diabetes under reasonable control. No bone pains. Was previously on oral iron 15-20 years ago but not recently. No other evidence of acute bleeding or blood loss.  INTERVAL HISTORY  Roger Blackwell returns today for management and evaluation of his multifactorial anemia. The patient's last visit with [place] was on 10/24/17. He is accompanied today by his wife. The pt reports that he is doing well overall.   The pt reports that he has been taking Vitamin D each day. He notes that he has been coughing a little more recently and endorses seasonal allergies. He notes some clear drainage in the morning and denies any fevers or chills. The pt notes that he continues to move well and endorses good energy levels.     Lab results today (02/23/18) of CBC w/diff, CMP, and Reticulocytes is as follows: all values are WNL except for WBC at 10.7k, RBC at 3.75, HGB at 9.7, HCT at 31.9, MCH at 25.9, Glucose at 150, Creatinine at 1.26, Albumin at 3.3. 02/23/18 Vitamin B12 at 924 02/23/18 Iron and TIBC revealed Iron at 34 and at 15% Saturation ratio 02/23/18 Ferritin is at 183  On review of systems, pt reports clear nasal discharge, allergies, cough, good energy levels, stable ankle swelling, moving his bowels well, and denies fevers, chills, night sweats, light headedness, dizziness, abdominal pains, problems passing urine, and any other symptoms.   MEDICAL HISTORY:  Past Medical History:  Diagnosis Date  . CAD (coronary artery disease)   . CHF (congestive heart failure) ([place])   . DM (diabetes mellitus) ([place])   . Dyslipidemia   . Gout   . Morbid obesity ([place])   . Systemic hypertension   History of adenomatous polyps Prostate cancer diagnosed in 1988 followed by Dr. Jeffie Pollock Hypertension, coronary artery disease history of positive PPD Doses Heart appearing Acanthosis nigricans COPD Erectile dysfunction Male hypogonadism Chronic kidney disease  SURGICAL HISTORY: Past Surgical History:  Procedure Laterality Date  . BACK SURGERY    . CORONARY ANGIOPLASTY WITH STENT PLACEMENT  12/26/2000   80% prox. circumflex lesion w/successful stent placement.  Marland Kitchen NM MYOVIEW LTD  08/31/2010   Normal  . PERICARDIOCENTESIS    . PROSTATE SURGERY    . US ECHOCARDIOGRAPHY  08/31/2010   EF 50-55%,RV  mildly dilated,mild aortic root dilatation    SOCIAL HISTORY: Social History   Socioeconomic History  . Marital status: Married    Spouse name: Not on file  . Number of children: Not on file  . Years of education: Not on file  . Highest education level: Not on file  Occupational History  . Not on file  Social Needs  . Financial resource strain: Not on file  . Food insecurity:    Worry: Not on file    Inability:  Not on file  . Transportation needs:    Medical: Not on file    Non-medical: Not on file  Tobacco Use  . Smoking status: Former Smoker    Last attempt to quit: 05/16/1993    Years since quitting: 24.7  . Smokeless tobacco: Never Used  Substance and Sexual Activity  . Alcohol use: Yes    Comment: seldom  . Drug use: No  . Sexual activity: Not on file  Lifestyle  . Physical activity:    Days per week: Not on file    Minutes per session: Not on file  . Stress: Not on file  Relationships  . Social connections:    Talks on phone: Not on file    Gets together: Not on file    Attends religious service: Not on file    Active member of club or organization: Not on file    Attends meetings of clubs or organizations: Not on file    Relationship status: Not on file  . Intimate partner violence:    Fear of current or ex partner: Not on file    Emotionally abused: Not on file    Physically abused: Not on file    Forced sexual activity: Not on file  Other Topics Concern  . Not on file  Social History Narrative  . Not on file    FAMILY HISTORY: Family History  Problem Relation Age of Onset  . Diabetes Brother   . Hypertension Sister     ALLERGIES:  is allergic to ivp dye [iodinated diagnostic agents].  MEDICATIONS:  Current Outpatient Medications  Medication Sig Dispense Refill  . allopurinol (ZYLOPRIM) 300 MG tablet 1 TABLET ONCE A DAY ORALLY 30 DAYS  3  . aspirin 81 MG tablet Take 81 mg by mouth daily.    . colchicine 0.6 MG tablet Take 0.6 mg by mouth daily as needed.     . furosemide (LASIX) 80 MG tablet Take 1 tablet (80 mg total) by mouth daily. 30 tablet 6  . insulin aspart (NOVOLOG) 100 UNIT/ML injection Inject 8 Units into the skin 3 (three) times daily before meals.     . Insulin Glargine (BASAGLAR KWIKPEN) 100 UNIT/ML SOPN Inject 28 Units into the skin at bedtime.    . iron polysaccharides (NIFEREX) 150 MG capsule Take 150 mg by mouth 2 (two) times daily.    Marland Kitchen  losartan-hydrochlorothiazide (HYZAAR) 100-25 MG tablet Take 1 tablet by mouth daily. 90 tablet 3  . pantoprazole (PROTONIX) 40 MG tablet 1 TABLET ONCE A DAY ORALLY 90 DAYS  0  . potassium chloride SA (K-DUR,KLOR-CON) 20 MEQ tablet Take 20 mEq by mouth daily.    . Travoprost, BAK Free, (TRAVATAN) 0.004 % SOLN ophthalmic solution Place 1 drop into both eyes at bedtime.    . vitamin B-12 (CYANOCOBALAMIN) 100 MCG tablet Take 100 mcg by mouth daily.     No current facility-administered medications for this visit.     REVIEW OF SYSTEMS:  A 10+ POINT REVIEW OF SYSTEMS WAS OBTAINED including neurology, dermatology, psychiatry, cardiac, respiratory, lymph, extremities, GI, GU, Musculoskeletal, constitutional, breasts, reproductive, HEENT.  All pertinent positives are noted in the HPI.  All others are negative.    PHYSICAL EXAMINATION:  ECOG PERFORMANCE STATUS: 2 - Symptomatic, <50% confined to bed  . Vitals:   02/23/18 1107  BP: 134/61  Pulse: 68  Resp: 14  Temp: 97.6 F (36.4 C)  SpO2: 96%   Filed Weights   02/23/18 1107  Weight: 284 lb 12.8 oz (129.2 kg)   .Body mass index is 37.57 kg/m.  GENERAL:alert, in no acute distress and comfortable SKIN: no acute rashes, no significant lesions EYES: conjunctiva are pink and non-injected, sclera anicteric OROPHARYNX: MMM, no exudates, no oropharyngeal erythema or ulceration NECK: supple, no JVD LYMPH:  no palpable lymphadenopathy in the cervical, axillary or inguinal regions LUNGS: clear to auscultation b/l with normal respiratory effort HEART: regular rate & rhythm ABDOMEN:  normoactive bowel sounds , non tender, not distended. No palpable hepatosplenomegaly.  Extremity: 1+ pedal edema PSYCH: alert & oriented x 3 with fluent speech NEURO: no focal motor/sensory deficits   LABORATORY DATA:  I have reviewed the data as listed  . CBC Latest Ref Rng & Units 02/23/2018 10/24/2017 08/01/2017  WBC 4.0 - 10.5 K/uL 10.7(H) 9.3 9.9    Hemoglobin 13.0 - 17.0 g/dL 9.7(L) 9.2(L) 9.3(L)  Hematocrit 39.0 - 52.0 % 31.9(L) 29.6(L) 29.5(L)  Platelets 150 - 400 K/uL 228 189 205  HGB 9.2  . CMP Latest Ref Rng & Units 02/23/2018 10/24/2017 08/01/2017  Glucose 70 - 99 mg/dL 150(H) 158(H) 54(L)  BUN 8 - 23 mg/dL 23 29(H) 25  Creatinine 0.61 - 1.24 mg/dL 1.26(H) 1.43(H) 1.68(H)  Sodium 135 - 145 mmol/L 141 138 142  Potassium 3.5 - 5.1 mmol/L 4.1 4.2 3.9  Chloride 98 - 111 mmol/L 108 108 109  CO2 22 - 32 mmol/L 26 25 25   Calcium 8.9 - 10.3 mg/dL 9.7 9.5 9.3  Total Protein 6.5 - 8.1 g/dL 7.1 6.9 7.0  Total Bilirubin 0.3 - 1.2 mg/dL 0.4 0.3 0.3  Alkaline Phos 38 - 126 U/L 75 77 75  AST 15 - 41 U/L 17 22 23   ALT 0 - 44 U/L 15 16 26    B12  -285 . Lab Results  Component Value Date   LDH 198 06/28/2016   . Lab Results  Component Value Date   IRON 34 (L) 02/23/2018   TIBC 233 02/23/2018   IRONPCTSAT 15 (L) 02/23/2018   (Iron and TIBC)  Lab Results  Component Value Date   FERRITIN 183 02/23/2018         RADIOGRAPHIC STUDIES: I have personally reviewed the radiological images as listed and agreed with the findings in the report. No results found.  ASSESSMENT & PLAN:   82 y.o. morbidly obese gentleman with multiple medical co-morbidities as noted above with   1) Normocytic/Borderline Microcytic Anemia This appears to be likely multifactorial.  -Patient notes that he is on Lupron shots and this could cause a drop of 1 g to 2 g of hemoglobin from baseline due to decreased testosterone levels. -His chronic kidney disease is also causing an element of anemia of chronic disease. -Myeloma panel shows no M spike. Some increase in serum free light chains likely due to decreased renal clearance. -Vitamin B12 levels borderline low at 285- improved to 563 with replacement -Normal LDH suggest against hemolysis. No overt GI bleeding. He could have some  decreased absorption due to chronic PPI use.  PLAN:  -If Hgb  continues to drop below 9, would order Erythropoietin shot, informed by his CKD. Discussed that this would only be warranted if Hgb drops significantly, given his hx of prostate cancer.  -Continue sublingual Vitamin B12 replacement -Advised pt to let us know if he develops any acute lightheadedness, dizziness or fatigue as he may need a transfusion.  -Continue follow-up with primary care physician for management of other medical comorbidities. -Discussed pt labwork today, 02/23/18; HGB improved to 9.7, low Iron Saturation at 15%, Ferritin at 183 beneath goal. Blood counts and chemistries are otherwise stable. Vitamin B12 satisfactorily replaced to 924 -Will order IV Injectafer x1 to keep Ferritin at goal >200 in the setting of his CKD, and would like Saturation >20% -Pt has previously responded to IV Injectafer which has held his anemia -Recommended steam inhalation and humidifier for sinus issues. -Recommended graded sports compression socks and elevating legs for ankle swelling  -Will see the pt back in 6 months, sooner if any new concerns   IV Injectafer x 1 dose in 2 weeks RTC with Dr Irene Limbo with labs in 6 months    All of the patients questions were answered with apparent satisfaction. The patient knows to call the clinic with any problems, questions or concerns.  The total time spent in the appt was 25 minutes and more than 50% was on counseling and direct patient cares.     Sullivan Lone MD MS AAHIVMS [facility] Hematology/Oncology Physician [facility]  (Office):       [phone] (Work cell):  [phone] (Fax):           [phone]  02/23/2018 11:41 AM  I, Baldwin [place], am acting as a scribe for Dr. Irene Limbo  .I have reviewed the above documentation for accuracy and completeness, and I agree with the above. Brunetta Genera MD

## 2018-03-07 ENCOUNTER — Inpatient Hospital Stay: Payer: Medicare Other

## 2018-03-07 VITALS — BP 127/59 | HR 67 | Temp 98.3°F | Resp 18

## 2018-03-07 DIAGNOSIS — D631 Anemia in chronic kidney disease: Secondary | ICD-10-CM | POA: Diagnosis not present

## 2018-03-07 DIAGNOSIS — Z79899 Other long term (current) drug therapy: Secondary | ICD-10-CM | POA: Diagnosis not present

## 2018-03-07 DIAGNOSIS — E538 Deficiency of other specified B group vitamins: Secondary | ICD-10-CM | POA: Diagnosis not present

## 2018-03-07 DIAGNOSIS — N183 Chronic kidney disease, stage 3 (moderate): Secondary | ICD-10-CM | POA: Diagnosis not present

## 2018-03-07 DIAGNOSIS — D509 Iron deficiency anemia, unspecified: Secondary | ICD-10-CM | POA: Diagnosis not present

## 2018-03-07 DIAGNOSIS — Z87891 Personal history of nicotine dependence: Secondary | ICD-10-CM | POA: Diagnosis not present

## 2018-03-07 DIAGNOSIS — M109 Gout, unspecified: Secondary | ICD-10-CM | POA: Diagnosis not present

## 2018-03-07 DIAGNOSIS — Z794 Long term (current) use of insulin: Secondary | ICD-10-CM | POA: Diagnosis not present

## 2018-03-07 DIAGNOSIS — Z7982 Long term (current) use of aspirin: Secondary | ICD-10-CM | POA: Diagnosis not present

## 2018-03-07 DIAGNOSIS — R05 Cough: Secondary | ICD-10-CM | POA: Diagnosis not present

## 2018-03-07 DIAGNOSIS — E1122 Type 2 diabetes mellitus with diabetic chronic kidney disease: Secondary | ICD-10-CM | POA: Diagnosis not present

## 2018-03-07 DIAGNOSIS — D508 Other iron deficiency anemias: Secondary | ICD-10-CM

## 2018-03-07 MED ORDER — SODIUM CHLORIDE 0.9 % IV SOLN
750.0000 mg | Freq: Once | INTRAVENOUS | Status: AC
  Administered 2018-03-07: 750 mg via INTRAVENOUS
  Filled 2018-03-07: qty 15

## 2018-03-07 MED ORDER — SODIUM CHLORIDE 0.9 % IV SOLN
Freq: Once | INTRAVENOUS | Status: AC
  Administered 2018-03-07: 12:00:00 via INTRAVENOUS
  Filled 2018-03-07: qty 250

## 2018-03-07 NOTE — Patient Instructions (Signed)
Ferric carboxymaltose injection What is this medicine? FERRIC CARBOXYMALTOSE (ferr-ik car-box-ee-mol-toes) is an iron complex. Iron is used to make healthy red blood cells, which carry oxygen and nutrients throughout the body. This medicine is used to treat anemia in people with chronic kidney disease or people who cannot take iron by mouth. This medicine may be used for other purposes; ask your health care provider or pharmacist if you have questions. COMMON BRAND NAME(S): Injectafer What should I tell my health care provider before I take this medicine? They need to know if you have any of these conditions: -anemia not caused by low iron levels -high levels of iron in the blood -liver disease -an unusual or allergic reaction to iron, other medicines, foods, dyes, or preservatives -pregnant or trying to get pregnant -breast-feeding How should I use this medicine? This medicine is for infusion into a vein. It is given by a health care professional in a hospital or clinic setting. Talk to your pediatrician regarding the use of this medicine in children. Special care may be needed. Overdosage: If you think you have taken too much of this medicine contact a poison control center or emergency room at once. NOTE: This medicine is only for you. Do not share this medicine with others. What if I miss a dose? It is important not to miss your dose. Call your doctor or health care professional if you are unable to keep an appointment. What may interact with this medicine? Do not take this medicine with any of the following medications: -deferoxamine -dimercaprol -other iron products This medicine may also interact with the following medications: -chloramphenicol -deferasirox This list may not describe all possible interactions. Give your health care provider a list of all the medicines, herbs, non-prescription drugs, or dietary supplements you use. Also tell them if you smoke, drink alcohol, or use  illegal drugs. Some items may interact with your medicine. What should I watch for while using this medicine? Visit your doctor or health care professional regularly. Tell your doctor if your symptoms do not start to get better or if they get worse. You may need blood work done while you are taking this medicine. You may need to follow a special diet. Talk to your doctor. Foods that contain iron include: whole grains/cereals, dried fruits, beans, or peas, leafy green vegetables, and organ meats (liver, kidney). What side effects may I notice from receiving this medicine? Side effects that you should report to your doctor or health care professional as soon as possible: -allergic reactions like skin rash, itching or hives, swelling of the face, lips, or tongue -breathing problems -changes in blood pressure -feeling faint or lightheaded, falls -flushing, sweating, or hot feelings Side effects that usually do not require medical attention (report to your doctor or health care professional if they continue or are bothersome): -changes in taste -constipation -dizziness -headache -nausea -pain, redness, or irritation at site where injected -vomiting This list may not describe all possible side effects. Call your doctor for medical advice about side effects. You may report side effects to FDA at 1-800-FDA-1088. Where should I keep my medicine? This drug is given in a hospital or clinic and will not be stored at home. NOTE: This sheet is a summary. It may not cover all possible information. If you have questions about this medicine, talk to your doctor, pharmacist, or health care provider.  2018 Elsevier/Gold Standard (2015-06-05 11:20:47)  

## 2018-03-17 ENCOUNTER — Ambulatory Visit (INDEPENDENT_AMBULATORY_CARE_PROVIDER_SITE_OTHER): Payer: Medicare Other | Admitting: Cardiovascular Disease

## 2018-03-17 ENCOUNTER — Encounter: Payer: Self-pay | Admitting: Cardiovascular Disease

## 2018-03-17 VITALS — BP 100/44 | HR 75 | Ht 73.0 in | Wt 278.0 lb

## 2018-03-17 DIAGNOSIS — I2510 Atherosclerotic heart disease of native coronary artery without angina pectoris: Secondary | ICD-10-CM

## 2018-03-17 DIAGNOSIS — I10 Essential (primary) hypertension: Secondary | ICD-10-CM | POA: Diagnosis not present

## 2018-03-17 DIAGNOSIS — N183 Chronic kidney disease, stage 3 unspecified: Secondary | ICD-10-CM

## 2018-03-17 DIAGNOSIS — I5032 Chronic diastolic (congestive) heart failure: Secondary | ICD-10-CM

## 2018-03-17 DIAGNOSIS — E669 Obesity, unspecified: Secondary | ICD-10-CM

## 2018-03-17 DIAGNOSIS — E7800 Pure hypercholesterolemia, unspecified: Secondary | ICD-10-CM

## 2018-03-17 DIAGNOSIS — Z6836 Body mass index (BMI) 36.0-36.9, adult: Secondary | ICD-10-CM

## 2018-03-17 DIAGNOSIS — E1169 Type 2 diabetes mellitus with other specified complication: Secondary | ICD-10-CM

## 2018-03-17 MED ORDER — LOSARTAN POTASSIUM 100 MG PO TABS: 100.0000 mg | ORAL_TABLET | Freq: Every day | ORAL | 3 refills | Status: DC

## 2018-03-17 NOTE — Patient Instructions (Signed)
Medication Instructions:  Dr Sallyanne Kuster has recommended making the following medication changes: 1. STOP Losartan HCT 2. START Losartan 100 mg daily  Your physician has requested that you regularly monitor your blood pressure at home. Please use the same machine to check your blood pressure daily. Keep a record of your blood pressures using the log sheet provided. In 1 month, please report your readings back to Dr C. You may use our online patient portal 'MyChart', call the office to speak with a nurse, or you can fax them to the office.  If you need a refill on your cardiac medications before your next appointment, please call your pharmacy.   Follow-Up: At [facility], you and your health needs are our priority.  As part of our continuing mission to provide you with exceptional heart care, we have created designated Provider Care Teams.  These Care Teams include your primary Cardiologist (physician) and Advanced Practice Providers (APPs -  Physician Assistants and Nurse Practitioners) who all work together to provide you with the care you need, when you need it. You will need a follow up appointment in 12 months.  Please call our office 2 months in advance to schedule this appointment.  You may see Sanda Klein, MD or one of the following Advanced Practice Providers on your designated Care Team: [place], [place] . Fabian Sharp, PA-C

## 2018-03-17 NOTE — Progress Notes (Signed)
Cardiology Office Note    Date:  03/19/2018   ID:  Remi Lopata, DOB [birth year], MRN [id number]  PCP:  [name], MD  Cardiologist:   Sanda Klein, MD   Chief Complaint  Patient presents with  . Follow-up  . Headache  . Shortness of Breath  . Edema    Legs and ankles.  CAD, CHF Dizziness  History of Present Illness:  Roger Blackwell is a 82 y.o. male with CAD status post remote PCI, diastolic heart failure and systemic hypertension.  He is doing quite well.  He denies any problems with leg edema or shortness of breath either at rest or with activity.  No orthopnea or PND.  Orthostatic hypotension and dizziness has been a less prominent complaint recently.  However he still occasionally becomes dizzy if he stands up too quickly.  His blood pressure is low.  He has continued to lose weight gradually.  He has not had recent gout attacks and denies claudication.  He has mild chronic anemia with a hemoglobin of 9.7 and mostly normocytic indices.  Repeat iron studies have shown low transferrin saturation ratios but normal ferritin levels.  He is taking iron supplements.  He is on daily low-dose aspirin.  He has a history of a stent placed to the left circumflex coronary artery for an 80% stenosis in 2002 (Dr. Glade Lloyd). He has not had new coronary event since that time. A nuclear stress test performed in April of 2012 showed normal myocardial perfusion. He has long-standing hypertension and requires multiple agents for blood pressure control and this is likely the substrate for his heart failure. His ejection fraction by echocardiography is normal and there are no convincing signs of diastolic dysfunction, but he responded well to diuretic therapy.   Past Medical History:  Diagnosis Date  . CAD (coronary artery disease)   . CHF (congestive heart failure) ([place])   . DM (diabetes mellitus) ([place])   . Dyslipidemia   . Gout   . Morbid obesity ([place])   . Systemic hypertension      Past Surgical History:  Procedure Laterality Date  . BACK SURGERY    . CORONARY ANGIOPLASTY WITH STENT PLACEMENT  12/26/2000   80% prox. circumflex lesion w/successful stent placement.  Marland Kitchen NM MYOVIEW LTD  08/31/2010   Normal  . PERICARDIOCENTESIS    . PROSTATE SURGERY    . US ECHOCARDIOGRAPHY  08/31/2010   EF 50-55%,RV mildly dilated,mild aortic root dilatation    Current Medications: Outpatient Medications Prior to Visit  Medication Sig Dispense Refill  . allopurinol (ZYLOPRIM) 300 MG tablet 1 TABLET ONCE A DAY ORALLY 30 DAYS  3  . aspirin 81 MG tablet Take 81 mg by mouth daily.    . colchicine 0.6 MG tablet Take 0.6 mg by mouth daily as needed.     . furosemide (LASIX) 80 MG tablet Take 1 tablet (80 mg total) by mouth daily. 30 tablet 6  . insulin aspart (NOVOLOG) 100 UNIT/ML injection Inject 8 Units into the skin 3 (three) times daily before meals.     . Insulin Glargine (BASAGLAR KWIKPEN) 100 UNIT/ML SOPN Inject 28 Units into the skin at bedtime.    . iron polysaccharides (NIFEREX) 150 MG capsule Take 150 mg by mouth 2 (two) times daily.    . pantoprazole (PROTONIX) 40 MG tablet 1 TABLET ONCE A DAY ORALLY 90 DAYS  0  . potassium chloride SA (K-DUR,KLOR-CON) 20 MEQ tablet Take 20 mEq by mouth daily.    Marland Kitchen  Travoprost, BAK Free, (TRAVATAN) 0.004 % SOLN ophthalmic solution Place 1 drop into both eyes at bedtime.    . vitamin B-12 (CYANOCOBALAMIN) 100 MCG tablet Take 100 mcg by mouth daily.    Marland Kitchen losartan-hydrochlorothiazide (HYZAAR) 100-25 MG tablet Take 1 tablet by mouth daily. 90 tablet 3   No facility-administered medications prior to visit.      Allergies:   Ivp dye [iodinated diagnostic agents]   Social History   Socioeconomic History  . Marital status: Married    Spouse name: Not on file  . Number of children: Not on file  . Years of education: Not on file  . Highest education level: Not on file  Occupational History  . Not on file  Social Needs  . Financial  resource strain: Not on file  . Food insecurity:    Worry: Not on file    Inability: Not on file  . Transportation needs:    Medical: Not on file    Non-medical: Not on file  Tobacco Use  . Smoking status: Former Smoker    Last attempt to quit: 05/16/1993    Years since quitting: 24.8  . Smokeless tobacco: Never Used  Substance and Sexual Activity  . Alcohol use: Yes    Comment: seldom  . Drug use: No  . Sexual activity: Not on file  Lifestyle  . Physical activity:    Days per week: Not on file    Minutes per session: Not on file  . Stress: Not on file  Relationships  . Social connections:    Talks on phone: Not on file    Gets together: Not on file    Attends religious service: Not on file    Active member of club or organization: Not on file    Attends meetings of clubs or organizations: Not on file    Relationship status: Not on file  Other Topics Concern  . Not on file  Social History Narrative  . Not on file     Family History:  The patient's family history includes Diabetes in his brother; Hypertension in his sister.   ROS:   Please see the history of present illness.    ROS All other systems reviewed and are negative.   PHYSICAL EXAM:   VS:  BP (!) 100/44 (BP Location: Left Arm, Patient Position: Sitting, Cuff Size: Large)   Pulse 75   Ht 6\' 1"  (1.854 m)   Wt 278 lb (126.1 kg)   BMI 36.68 kg/m     General: Alert, oriented x3, no distress, moderately obese Head: no evidence of trauma, PERRL, EOMI, no exophtalmos or lid lag, no myxedema, no xanthelasma; normal ears, nose and oropharynx Neck: normal jugular venous pulsations and no hepatojugular reflux; brisk carotid pulses without delay and no carotid bruits Chest: clear to auscultation, no signs of consolidation by percussion or palpation, normal fremitus, symmetrical and full respiratory excursions Cardiovascular: normal position and quality of the apical impulse, regular rhythm, normal first and second  heart sounds, no murmurs, rubs or gallops Abdomen: no tenderness or distention, no masses by palpation, no abnormal pulsatility or arterial bruits, normal bowel sounds, no hepatosplenomegaly Extremities: no clubbing, cyanosis or edema; 2+ radial, ulnar and brachial pulses bilaterally; 2+ right femoral, posterior tibial and dorsalis pedis pulses; 2+ left femoral, posterior tibial and dorsalis pedis pulses; no subclavian or femoral bruits Neurological: grossly nonfocal Psych: Normal mood and affect    Wt Readings from Last 3 Encounters:  03/17/18 278 lb (126.1  kg)  02/23/18 284 lb 12.8 oz (129.2 kg)  10/24/17 292 lb 6.4 oz (132.6 kg)      Studies/Labs Reviewed:   EKG:  EKG is ordered today.  Sinus rhythm with first-degree AV block, small but sharp Q waves in leads II and aVF, QTC 462 ms, no acute repolarization of normalities BMET    Component Value Date/Time   NA 141 02/23/2018 0936   NA 139 02/01/2017 0829   K 4.1 02/23/2018 0936   K 4.2 02/01/2017 0829   CL 108 02/23/2018 0936   CO2 26 02/23/2018 0936   CO2 22 02/01/2017 0829   GLUCOSE 150 (H) 02/23/2018 0936   GLUCOSE 167 (H) 02/01/2017 0829   BUN 23 02/23/2018 0936   BUN 36.1 (H) 02/01/2017 0829   CREATININE 1.26 (H) 02/23/2018 0936   CREATININE 1.5 (H) 02/01/2017 0829   CALCIUM 9.7 02/23/2018 0936   CALCIUM 9.4 02/01/2017 0829   GFRNONAA 52 (L) 02/23/2018 0936   GFRAA >60 02/23/2018 0936     total cholesterol 109, HDL 47, LDL 50, triglycerides 61 (May 2018).  ASSESSMENT:    1. Chronic diastolic heart failure ([place])   2. Coronary artery disease involving native coronary artery of native heart without angina pectoris   3. Essential hypertension   4. CKD (chronic kidney disease) stage 3, GFR 30-59 ml/min (HCC)   5. Hypercholesterolemia   6. Diabetes mellitus type 2 in obese (HCC)   7. Class 2 severe obesity due to excess calories with serious comorbidity and body mass index (BMI) of 36.0 to 36.9 in adult [facility])       PLAN:  In order of problems listed above:  1. CHF: NYHA functional class I, clinically euvolemic, on a moderate dose of loop diuretic, also taking a thiazide.  Preserved LVEF 2. CAD: Asymptomatic since his revascularization 17 years ago.  He remains free of angina. 3. HTN: His blood pressure has become easier and easier to control as he is losing weight.  We will discontinue the hydrochlorothiazide component of his antihypertensive medication.  Over the years we have had to reduce his beta-blocker since it caused bradycardia.   4. CKD 3: Most recent creatinine is actually improved from a year ago. 5. HLP: Excellent lipid profile on statin therapy, ask him to provide [place] a copy when rechecked. 6. DM: He tells me that his glycemic control is good. 7. Obesity: He has made excellent progress with weight loss but remains moderately obese.  He plans to continue losing weight.  Medication Adjustments/Labs and Tests Ordered: Current medicines are reviewed at length with the patient today.  Concerns regarding medicines are outlined above.  Medication changes, Labs and Tests ordered today are listed in the Patient Instructions below. Patient Instructions  Medication Instructions:  Dr Sallyanne Kuster has recommended making the following medication changes: 1. STOP Losartan HCT 2. START Losartan 100 mg daily  Your physician has requested that you regularly monitor your blood pressure at home. Please use the same machine to check your blood pressure daily. Keep a record of your blood pressures using the log sheet provided. In 1 month, please report your readings back to Dr C. You may use our online patient portal 'MyChart', call the office to speak with a nurse, or you can fax them to the office.  If you need a refill on your cardiac medications before your next appointment, please call your pharmacy.   Follow-Up: At [facility], you and your health needs are our priority.  As  part of our continuing  mission to provide you with exceptional heart care, we have created designated Provider Care Teams.  These Care Teams include your primary Cardiologist (physician) and Advanced Practice Providers (APPs -  Physician Assistants and Nurse Practitioners) who all work together to provide you with the care you need, when you need it. You will need a follow up appointment in 12 months.  Please call our office 2 months in advance to schedule this appointment.  You may see Sanda Klein, MD or one of the following Advanced Practice Providers on your designated Care Team: [place], [place] . Fabian Sharp, PA-C    Signed, Sanda Klein, MD  03/19/2018 1:40 PM    [facility] [address], [place], [place]  [address] Phone: [phone]; Fax: [phone]

## 2018-03-19 DIAGNOSIS — N183 Chronic kidney disease, stage 3 unspecified: Secondary | ICD-10-CM | POA: Insufficient documentation

## 2018-03-28 DIAGNOSIS — E1121 Type 2 diabetes mellitus with diabetic nephropathy: Secondary | ICD-10-CM | POA: Diagnosis not present

## 2018-03-28 DIAGNOSIS — E559 Vitamin D deficiency, unspecified: Secondary | ICD-10-CM | POA: Diagnosis not present

## 2018-03-30 DIAGNOSIS — I509 Heart failure, unspecified: Secondary | ICD-10-CM | POA: Diagnosis not present

## 2018-03-30 DIAGNOSIS — I10 Essential (primary) hypertension: Secondary | ICD-10-CM | POA: Diagnosis not present

## 2018-03-30 DIAGNOSIS — Z23 Encounter for immunization: Secondary | ICD-10-CM | POA: Diagnosis not present

## 2018-03-30 DIAGNOSIS — M109 Gout, unspecified: Secondary | ICD-10-CM | POA: Diagnosis not present

## 2018-03-30 DIAGNOSIS — E1121 Type 2 diabetes mellitus with diabetic nephropathy: Secondary | ICD-10-CM | POA: Diagnosis not present

## 2018-04-06 DIAGNOSIS — D649 Anemia, unspecified: Secondary | ICD-10-CM | POA: Diagnosis not present

## 2018-04-06 DIAGNOSIS — M109 Gout, unspecified: Secondary | ICD-10-CM | POA: Diagnosis not present

## 2018-04-06 DIAGNOSIS — N189 Chronic kidney disease, unspecified: Secondary | ICD-10-CM | POA: Diagnosis not present

## 2018-04-06 DIAGNOSIS — I509 Heart failure, unspecified: Secondary | ICD-10-CM | POA: Diagnosis not present

## 2018-04-06 DIAGNOSIS — E790 Hyperuricemia without signs of inflammatory arthritis and tophaceous disease: Secondary | ICD-10-CM | POA: Diagnosis not present

## 2018-04-12 DIAGNOSIS — I509 Heart failure, unspecified: Secondary | ICD-10-CM | POA: Diagnosis not present

## 2018-04-24 DIAGNOSIS — H401111 Primary open-angle glaucoma, right eye, mild stage: Secondary | ICD-10-CM | POA: Diagnosis not present

## 2018-05-15 DIAGNOSIS — C775 Secondary and unspecified malignant neoplasm of intrapelvic lymph nodes: Secondary | ICD-10-CM | POA: Diagnosis not present

## 2018-05-15 DIAGNOSIS — R8271 Bacteriuria: Secondary | ICD-10-CM | POA: Diagnosis not present

## 2018-05-16 DIAGNOSIS — I259 Chronic ischemic heart disease, unspecified: Secondary | ICD-10-CM | POA: Diagnosis not present

## 2018-05-16 DIAGNOSIS — R05 Cough: Secondary | ICD-10-CM | POA: Diagnosis not present

## 2018-05-16 DIAGNOSIS — R042 Hemoptysis: Secondary | ICD-10-CM | POA: Diagnosis not present

## 2018-05-18 ENCOUNTER — Other Ambulatory Visit: Payer: Self-pay | Admitting: Internal Medicine

## 2018-05-18 ENCOUNTER — Ambulatory Visit
Admission: RE | Admit: 2018-05-18 | Discharge: 2018-05-18 | Disposition: A | Discharge status: Home / Self Care | Payer: Medicare Other | Source: Ambulatory Visit | Attending: Internal Medicine | Admitting: Internal Medicine

## 2018-05-18 DIAGNOSIS — R042 Hemoptysis: Secondary | ICD-10-CM

## 2018-05-18 DIAGNOSIS — J181 Lobar pneumonia, unspecified organism: Secondary | ICD-10-CM | POA: Diagnosis not present

## 2018-05-18 DIAGNOSIS — R110 Nausea: Secondary | ICD-10-CM | POA: Diagnosis not present

## 2018-05-18 DIAGNOSIS — R05 Cough: Secondary | ICD-10-CM | POA: Diagnosis not present

## 2018-05-19 ENCOUNTER — Other Ambulatory Visit: Payer: Self-pay

## 2018-05-19 ENCOUNTER — Telehealth: Payer: Self-pay | Admitting: Internal Medicine

## 2018-05-19 ENCOUNTER — Encounter (HOSPITAL_COMMUNITY): Payer: Self-pay

## 2018-05-19 ENCOUNTER — Inpatient Hospital Stay (HOSPITAL_COMMUNITY)
Admission: EM | Admit: 2018-05-19 | Discharge: 2018-05-25 | DRG: 166 | Disposition: A | Discharge status: Home / Self Care | Payer: Medicare Other | Attending: Internal Medicine | Admitting: Internal Medicine

## 2018-05-19 DIAGNOSIS — Z8546 Personal history of malignant neoplasm of prostate: Secondary | ICD-10-CM

## 2018-05-19 DIAGNOSIS — Z79818 Long term (current) use of other agents affecting estrogen receptors and estrogen levels: Secondary | ICD-10-CM

## 2018-05-19 DIAGNOSIS — E440 Moderate protein-calorie malnutrition: Secondary | ICD-10-CM

## 2018-05-19 DIAGNOSIS — Z955 Presence of coronary angioplasty implant and graft: Secondary | ICD-10-CM

## 2018-05-19 DIAGNOSIS — J850 Gangrene and necrosis of lung: Secondary | ICD-10-CM | POA: Diagnosis present

## 2018-05-19 DIAGNOSIS — Z8249 Family history of ischemic heart disease and other diseases of the circulatory system: Secondary | ICD-10-CM | POA: Diagnosis not present

## 2018-05-19 DIAGNOSIS — R634 Abnormal weight loss: Secondary | ICD-10-CM

## 2018-05-19 DIAGNOSIS — R042 Hemoptysis: Secondary | ICD-10-CM | POA: Diagnosis not present

## 2018-05-19 DIAGNOSIS — Z91041 Radiographic dye allergy status: Secondary | ICD-10-CM

## 2018-05-19 DIAGNOSIS — Z833 Family history of diabetes mellitus: Secondary | ICD-10-CM | POA: Diagnosis not present

## 2018-05-19 DIAGNOSIS — B9620 Unspecified Escherichia coli [E. coli] as the cause of diseases classified elsewhere: Secondary | ICD-10-CM | POA: Diagnosis present

## 2018-05-19 DIAGNOSIS — Z79899 Other long term (current) drug therapy: Secondary | ICD-10-CM

## 2018-05-19 DIAGNOSIS — I130 Hypertensive heart and chronic kidney disease with heart failure and stage 1 through stage 4 chronic kidney disease, or unspecified chronic kidney disease: Secondary | ICD-10-CM | POA: Diagnosis not present

## 2018-05-19 DIAGNOSIS — I5033 Acute on chronic diastolic (congestive) heart failure: Secondary | ICD-10-CM | POA: Diagnosis not present

## 2018-05-19 DIAGNOSIS — Z9889 Other specified postprocedural states: Secondary | ICD-10-CM | POA: Diagnosis not present

## 2018-05-19 DIAGNOSIS — E1122 Type 2 diabetes mellitus with diabetic chronic kidney disease: Secondary | ICD-10-CM | POA: Diagnosis present

## 2018-05-19 DIAGNOSIS — Z87891 Personal history of nicotine dependence: Secondary | ICD-10-CM | POA: Diagnosis not present

## 2018-05-19 DIAGNOSIS — Z6830 Body mass index (BMI) 30.0-30.9, adult: Secondary | ICD-10-CM

## 2018-05-19 DIAGNOSIS — N390 Urinary tract infection, site not specified: Secondary | ICD-10-CM | POA: Diagnosis present

## 2018-05-19 DIAGNOSIS — N183 Chronic kidney disease, stage 3 (moderate): Secondary | ICD-10-CM | POA: Diagnosis not present

## 2018-05-19 DIAGNOSIS — J440 Chronic obstructive pulmonary disease with acute lower respiratory infection: Secondary | ICD-10-CM | POA: Diagnosis not present

## 2018-05-19 DIAGNOSIS — M109 Gout, unspecified: Secondary | ICD-10-CM | POA: Diagnosis not present

## 2018-05-19 DIAGNOSIS — E785 Hyperlipidemia, unspecified: Secondary | ICD-10-CM | POA: Diagnosis present

## 2018-05-19 DIAGNOSIS — I2510 Atherosclerotic heart disease of native coronary artery without angina pectoris: Secondary | ICD-10-CM | POA: Diagnosis not present

## 2018-05-19 DIAGNOSIS — J90 Pleural effusion, not elsewhere classified: Secondary | ICD-10-CM | POA: Diagnosis not present

## 2018-05-19 DIAGNOSIS — Z794 Long term (current) use of insulin: Secondary | ICD-10-CM

## 2018-05-19 DIAGNOSIS — Z8701 Personal history of pneumonia (recurrent): Secondary | ICD-10-CM

## 2018-05-19 DIAGNOSIS — E669 Obesity, unspecified: Secondary | ICD-10-CM | POA: Diagnosis present

## 2018-05-19 DIAGNOSIS — D631 Anemia in chronic kidney disease: Secondary | ICD-10-CM | POA: Diagnosis present

## 2018-05-19 DIAGNOSIS — R05 Cough: Secondary | ICD-10-CM | POA: Diagnosis not present

## 2018-05-19 DIAGNOSIS — I5031 Acute diastolic (congestive) heart failure: Secondary | ICD-10-CM

## 2018-05-19 DIAGNOSIS — J189 Pneumonia, unspecified organism: Secondary | ICD-10-CM | POA: Diagnosis not present

## 2018-05-19 DIAGNOSIS — N400 Enlarged prostate without lower urinary tract symptoms: Secondary | ICD-10-CM | POA: Diagnosis present

## 2018-05-19 DIAGNOSIS — Z7982 Long term (current) use of aspirin: Secondary | ICD-10-CM

## 2018-05-19 DIAGNOSIS — R531 Weakness: Secondary | ICD-10-CM | POA: Diagnosis not present

## 2018-05-19 LAB — CBC
HCT: 31.9 % — ABNORMAL LOW (ref 39.0–52.0)
Hemoglobin: 9.4 g/dL — ABNORMAL LOW (ref 13.0–17.0)
MCH: 25.9 pg — ABNORMAL LOW (ref 26.0–34.0)
MCHC: 29.5 g/dL — ABNORMAL LOW (ref 30.0–36.0)
MCV: 87.9 fL (ref 80.0–100.0)
NRBC: 0 % (ref 0.0–0.2)
PLATELETS: 221 10*3/uL (ref 150–400)
RBC: 3.63 MIL/uL — ABNORMAL LOW (ref 4.22–5.81)
RDW: 14.7 % (ref 11.5–15.5)
WBC: 10.2 10*3/uL (ref 4.0–10.5)

## 2018-05-19 LAB — URINALYSIS, ROUTINE W REFLEX MICROSCOPIC
BILIRUBIN URINE: NEGATIVE
GLUCOSE, UA: NEGATIVE mg/dL
Ketones, ur: NEGATIVE mg/dL
NITRITE: NEGATIVE
Protein, ur: NEGATIVE mg/dL
SPECIFIC GRAVITY, URINE: 1.016 (ref 1.005–1.030)
WBC, UA: >50 WBC/hpf — ABNORMAL HIGH (ref 0–5)
pH: 5 (ref 5.0–8.0)

## 2018-05-19 LAB — BASIC METABOLIC PANEL
Anion gap: 9 (ref 5–15)
BUN: 16 mg/dL (ref 8–23)
CALCIUM: 9.1 mg/dL (ref 8.9–10.3)
CO2: 25 mmol/L (ref 22–32)
CREATININE: 1.21 mg/dL (ref 0.61–1.24)
Chloride: 106 mmol/L (ref 98–111)
GFR calc non Af Amer: 56 mL/min — ABNORMAL LOW (ref >60)
Glucose, Bld: 125 mg/dL — ABNORMAL HIGH (ref 70–99)
Potassium: 4.1 mmol/L (ref 3.5–5.1)
SODIUM: 140 mmol/L (ref 135–145)

## 2018-05-19 LAB — CBG MONITORING, ED: Glucose-Capillary: 107 mg/dL — ABNORMAL HIGH (ref 70–99)

## 2018-05-19 LAB — HEMOGLOBIN A1C
Hgb A1c MFr Bld: 5.9 % — ABNORMAL HIGH (ref 4.8–5.6)
Mean Plasma Glucose: 122.63 mg/dL

## 2018-05-19 MED ORDER — SODIUM CHLORIDE 0.9 % IV SOLN
2.0000 g | Freq: Once | INTRAVENOUS | Status: DC
  Filled 2018-05-19: qty 2

## 2018-05-19 MED ORDER — ALLOPURINOL 300 MG PO TABS
900.0000 mg | ORAL_TABLET | Freq: Every day | ORAL | Status: DC
  Administered 2018-05-19 – 2018-05-25 (×6): 900 mg via ORAL
  Filled 2018-05-19 (×6): qty 3

## 2018-05-19 MED ORDER — LATANOPROST 0.005 % OP SOLN
1.0000 [drp] | Freq: Every day | OPHTHALMIC | Status: DC
  Administered 2018-05-20 – 2018-05-24 (×5): 1 [drp] via OPHTHALMIC
  Filled 2018-05-19: qty 2.5

## 2018-05-19 MED ORDER — HYDRALAZINE HCL 20 MG/ML IJ SOLN: 10.0000 mg | Freq: Four times a day (QID) | INTRAMUSCULAR | Status: DC | PRN

## 2018-05-19 MED ORDER — METRONIDAZOLE IN NACL 5-0.79 MG/ML-% IV SOLN: 500.0000 mg | Freq: Once | INTRAVENOUS | Status: DC

## 2018-05-19 MED ORDER — VANCOMYCIN HCL IN DEXTROSE 1-5 GM/200ML-% IV SOLN
1000.0000 mg | Freq: Once | INTRAVENOUS | Status: AC
  Administered 2018-05-19: 1000 mg via INTRAVENOUS
  Filled 2018-05-19: qty 200

## 2018-05-19 MED ORDER — VANCOMYCIN HCL IN DEXTROSE 1-5 GM/200ML-% IV SOLN
1000.0000 mg | Freq: Two times a day (BID) | INTRAVENOUS | Status: DC
  Administered 2018-05-20 (×2): 1000 mg via INTRAVENOUS
  Filled 2018-05-19: qty 200

## 2018-05-19 MED ORDER — SODIUM CHLORIDE 0.9 % IV SOLN
2.0000 g | Freq: Two times a day (BID) | INTRAVENOUS | Status: DC
  Administered 2018-05-20 – 2018-05-22 (×5): 2 g via INTRAVENOUS
  Filled 2018-05-19 (×6): qty 2

## 2018-05-19 MED ORDER — METRONIDAZOLE IN NACL 5-0.79 MG/ML-% IV SOLN
500.0000 mg | Freq: Three times a day (TID) | INTRAVENOUS | Status: DC
  Administered 2018-05-20 – 2018-05-25 (×16): 500 mg via INTRAVENOUS
  Filled 2018-05-19 (×16): qty 100

## 2018-05-19 MED ORDER — INSULIN ASPART 100 UNIT/ML ~~LOC~~ SOLN
0.0000 [IU] | Freq: Three times a day (TID) | SUBCUTANEOUS | Status: DC
  Administered 2018-05-22 (×2): 1 [IU] via SUBCUTANEOUS
  Administered 2018-05-23: 2 [IU] via SUBCUTANEOUS
  Administered 2018-05-24: 1 [IU] via SUBCUTANEOUS
  Administered 2018-05-25: 2 [IU] via SUBCUTANEOUS
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
## 2018-05-19 NOTE — ED Triage Notes (Addendum)
Patient c/o of "spitting up blood" and weakness  x2days.    A/Ox4 Wheelchair in triage.  Denies using wheelchair, cane, or walker at home.

## 2018-05-19 NOTE — Telephone Encounter (Signed)
Will route message to MW so that he may call Dr. Shelia Media.
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
## 2018-05-19 NOTE — Progress Notes (Signed)
Pharmacy Antibiotic Note  Roger Blackwell is a 83 y.o. male admitted on 05/19/2018 with pneumonia.  Pharmacy has been consulted for vancomycin and cefepime dosing.   PMH includes heart failure, type 2 diabetes, hypertension, CKD stage III, CAD with a stent in place 2002, morbid obesity, gout, hyperlipidemia admitted with complaints of hemoptysis for the last 4 days.     Plan:  Cefepime 2 gr IV q12h   Vancomycin 1000 mg IV q12h   Metronidazole 500 mg IV q8h ( MD)   Monitor clinical course, renal function, cultures as available     Temp (24hrs), Avg:98.3 F (36.8 C), Min:98.3 F (36.8 C), Max:98.3 F (36.8 C)  Recent Labs  Lab 05/19/18 1406  WBC 10.2  CREATININE 1.21    CrCl cannot be calculated (Unknown ideal weight.).    Allergies  Allergen Reactions  . Ivp Dye [Iodinated Diagnostic Agents]     Antimicrobials this admission: 1/3 Vancomycin >>  1/3 cefepime >>  1/3 metronidazole >>   Dose adjustments this admission:   Microbiology results: 1/3 BCx:     Thank you for allowing pharmacy to be a part of this patient's care.   Royetta Asal, PharmD, BCPS Pager [phone] 05/19/2018 6:31 PM
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
## 2018-05-20 DIAGNOSIS — Z955 Presence of coronary angioplasty implant and graft: Secondary | ICD-10-CM | POA: Diagnosis not present

## 2018-05-20 DIAGNOSIS — I5033 Acute on chronic diastolic (congestive) heart failure: Secondary | ICD-10-CM | POA: Diagnosis present

## 2018-05-20 DIAGNOSIS — Z6830 Body mass index (BMI) 30.0-30.9, adult: Secondary | ICD-10-CM | POA: Diagnosis not present

## 2018-05-20 DIAGNOSIS — N183 Chronic kidney disease, stage 3 (moderate): Secondary | ICD-10-CM | POA: Diagnosis present

## 2018-05-20 DIAGNOSIS — Z9889 Other specified postprocedural states: Secondary | ICD-10-CM | POA: Diagnosis not present

## 2018-05-20 DIAGNOSIS — Z8249 Family history of ischemic heart disease and other diseases of the circulatory system: Secondary | ICD-10-CM | POA: Diagnosis not present

## 2018-05-20 DIAGNOSIS — R634 Abnormal weight loss: Secondary | ICD-10-CM

## 2018-05-20 DIAGNOSIS — M109 Gout, unspecified: Secondary | ICD-10-CM | POA: Diagnosis present

## 2018-05-20 DIAGNOSIS — J440 Chronic obstructive pulmonary disease with acute lower respiratory infection: Secondary | ICD-10-CM | POA: Diagnosis present

## 2018-05-20 DIAGNOSIS — N400 Enlarged prostate without lower urinary tract symptoms: Secondary | ICD-10-CM | POA: Diagnosis present

## 2018-05-20 DIAGNOSIS — I5031 Acute diastolic (congestive) heart failure: Secondary | ICD-10-CM | POA: Diagnosis not present

## 2018-05-20 DIAGNOSIS — J90 Pleural effusion, not elsewhere classified: Secondary | ICD-10-CM | POA: Diagnosis not present

## 2018-05-20 DIAGNOSIS — Z8701 Personal history of pneumonia (recurrent): Secondary | ICD-10-CM | POA: Diagnosis not present

## 2018-05-20 DIAGNOSIS — J189 Pneumonia, unspecified organism: Secondary | ICD-10-CM | POA: Diagnosis not present

## 2018-05-20 DIAGNOSIS — E1122 Type 2 diabetes mellitus with diabetic chronic kidney disease: Secondary | ICD-10-CM | POA: Diagnosis present

## 2018-05-20 DIAGNOSIS — E669 Obesity, unspecified: Secondary | ICD-10-CM | POA: Diagnosis present

## 2018-05-20 DIAGNOSIS — D631 Anemia in chronic kidney disease: Secondary | ICD-10-CM | POA: Diagnosis present

## 2018-05-20 DIAGNOSIS — R848 Other abnormal findings in specimens from respiratory organs and thorax: Secondary | ICD-10-CM | POA: Diagnosis not present

## 2018-05-20 DIAGNOSIS — N390 Urinary tract infection, site not specified: Secondary | ICD-10-CM | POA: Diagnosis present

## 2018-05-20 DIAGNOSIS — Z87891 Personal history of nicotine dependence: Secondary | ICD-10-CM | POA: Diagnosis not present

## 2018-05-20 DIAGNOSIS — J850 Gangrene and necrosis of lung: Secondary | ICD-10-CM | POA: Diagnosis present

## 2018-05-20 DIAGNOSIS — Z8546 Personal history of malignant neoplasm of prostate: Secondary | ICD-10-CM | POA: Diagnosis not present

## 2018-05-20 DIAGNOSIS — Z833 Family history of diabetes mellitus: Secondary | ICD-10-CM | POA: Diagnosis not present

## 2018-05-20 DIAGNOSIS — E440 Moderate protein-calorie malnutrition: Secondary | ICD-10-CM | POA: Diagnosis present

## 2018-05-20 DIAGNOSIS — R042 Hemoptysis: Secondary | ICD-10-CM | POA: Diagnosis present

## 2018-05-20 DIAGNOSIS — I130 Hypertensive heart and chronic kidney disease with heart failure and stage 1 through stage 4 chronic kidney disease, or unspecified chronic kidney disease: Secondary | ICD-10-CM | POA: Diagnosis present

## 2018-05-20 DIAGNOSIS — B9620 Unspecified Escherichia coli [E. coli] as the cause of diseases classified elsewhere: Secondary | ICD-10-CM | POA: Diagnosis present

## 2018-05-20 DIAGNOSIS — R05 Cough: Secondary | ICD-10-CM | POA: Diagnosis not present

## 2018-05-20 DIAGNOSIS — E785 Hyperlipidemia, unspecified: Secondary | ICD-10-CM | POA: Diagnosis present

## 2018-05-20 DIAGNOSIS — I2510 Atherosclerotic heart disease of native coronary artery without angina pectoris: Secondary | ICD-10-CM | POA: Diagnosis present

## 2018-05-20 LAB — GLUCOSE, CAPILLARY
GLUCOSE-CAPILLARY: 113 mg/dL — AB (ref 70–99)
Glucose-Capillary: 106 mg/dL — ABNORMAL HIGH (ref 70–99)
Glucose-Capillary: 106 mg/dL — ABNORMAL HIGH (ref 70–99)
Glucose-Capillary: 136 mg/dL — ABNORMAL HIGH (ref 70–99)

## 2018-05-20 LAB — MRSA PCR SCREENING: MRSA by PCR: NEGATIVE

## 2018-05-20 MED ORDER — VANCOMYCIN HCL 10 G IV SOLR
1500.0000 mg | INTRAVENOUS | Status: DC
  Administered 2018-05-21: 1500 mg via INTRAVENOUS
  Filled 2018-05-20: qty 1500

## 2018-05-20 NOTE — Consult Note (Signed)
[address]       [place] [address]             [phone]        Chibuikem Regino [place] Medical Record #[id number] Date of Birth: 04/22/1936  Referring: No ref. provider found Primary Care: [name], MD Primary Cardiologist:No primary care provider on file.  Chief Complaint:   Hemoptysis, fatigue Chief Complaint  Patient presents with  . Hemoptysis  . Weakness    History of Present Illness:     Patient examined and images of most recent CT scan of the chest personally reviewed as well as chest CT scans dating back to 2015.  Patient is a 83 year old reformed smoker with multiple medical problems including diabetes, hypertension, anemia, chronic kidney disease, history of prostate cancer on Lupron, and diastolic heart failure.  He presented to the ED with hemoptysis, persistent chronic cough with blood streaked sputum.  A CT scan performed by his primary physician demonstrated a significant right lower lobe consolidation with pleural thickening and a small loculated pleural effusion/empyema.  I was asked to evaluate the patient for the need of surgical drainage. The patient denies right-sided pleuritic pain.  He feels he has had some fever at home.  Over 20 years ago the patient had left-sided pneumonia and empyema and had a left thoracotomy and drainage procedure by Dr. Arlyce Dice.  No records are available of this event.  Since be admitted to the hospital placed on broad-spectrum antibiotics the patient has felt better.  His hemoptysis is improved.  The patient is on no anticoagulation at home. He has had no bleeding from other sites including nose or GI tract and his platelet count is normal.  Hematocrit is 32%, baseline for him with chronic anemia.  Patient has had a 40 pound weight loss over the past year but has been morbidly obese.  Current Activity/ Functional Status: Sedentary lifestyle Zubrod Score: At the time of surgery this patient's  most appropriate activity status/level should be described as: []     0    Normal activity, no symptoms []     1    Restricted in physical strenuous activity but ambulatory, able to do out light work [x]     2    Ambulatory and capable of self care, unable to do work activities, up and about                 more than 50%  Of the time                            []     3    Only limited self care, in bed greater than 50% of waking hours []     4    Completely disabled, no self care, confined to bed or chair []     5    Moribund  Past Medical History:  Diagnosis Date  . CAD (coronary artery disease)   . CHF (congestive heart failure) ([place])   . DM (diabetes mellitus) ([place])   . Dyslipidemia   . Gout   . Morbid obesity ([place])   . Systemic hypertension     Past Surgical History:  Procedure Laterality Date  . BACK SURGERY    . CORONARY ANGIOPLASTY WITH STENT PLACEMENT  12/26/2000   80% prox. circumflex lesion w/successful stent placement.  Marland Kitchen NM MYOVIEW LTD  08/31/2010   Normal  . PERICARDIOCENTESIS    .  PROSTATE SURGERY    . US ECHOCARDIOGRAPHY  08/31/2010   EF 50-55%,RV mildly dilated,mild aortic root dilatation    Social History   Tobacco Use  Smoking Status Former Smoker  . Last attempt to quit: 05/16/1993  . Years since quitting: 25.0  Smokeless Tobacco Never Used    Social History   Substance and Sexual Activity  Alcohol Use Yes   Comment: seldom     Allergies  Allergen Reactions  . Ivp Dye [Iodinated Diagnostic Agents]     Current Facility-Administered Medications  Medication Dose Route Frequency Provider Last Rate Last Dose  . allopurinol (ZYLOPRIM) tablet 900 mg  900 mg Oral Daily Georgette Shell, MD   900 mg at 05/20/18 6384  . ceFEPIme (MAXIPIME) 2 g in sodium chloride 0.9 % 100 mL IVPB  2 g Intravenous Once Charlesetta Shanks, MD   Stopped at 05/20/18 0700  . ceFEPIme (MAXIPIME) 2 g in sodium chloride 0.9 % 100 mL IVPB  2 g Intravenous Q12H Royetta Asal, RPH    Stopped at 05/20/18 0700  . hydrALAZINE (APRESOLINE) injection 10 mg  10 mg Intravenous Q6H PRN Georgette Shell, MD      . insulin aspart (novoLOG) injection 0-9 Units  0-9 Units Subcutaneous TID WC Georgette Shell, MD      . latanoprost (XALATAN) 0.005 % ophthalmic solution 1 drop  1 drop Both Eyes QHS Georgette Shell, MD      . metroNIDAZOLE (FLAGYL) IVPB 500 mg  500 mg Intravenous Q8H Georgette Shell, MD   Stopped at 05/20/18 1505  . [START ON 05/21/2018] vancomycin (VANCOCIN) 1,500 mg in sodium chloride 0.9 % 500 mL IVPB  1,500 mg Intravenous Q24H Wofford, Drew A, RPH        Medications Prior to Admission  Medication Sig Dispense Refill Last Dose  . allopurinol (ZYLOPRIM) 300 MG tablet Take 900 mg by mouth daily.   3 05/18/2018 at Unknown time  . aspirin 81 MG tablet Take 81 mg by mouth daily.   05/14/2018 at Unknown time  . ibuprofen (ADVIL,MOTRIN) 200 MG tablet Take 200 mg by mouth every 6 (six) hours as needed for headache.   Past Week at Unknown time  . insulin detemir (LEVEMIR) 100 UNIT/ML injection Inject 30 Units into the skin daily.   05/18/2018 at Unknown time  . insulin lispro (HUMALOG KWIKPEN) 100 UNIT/ML KwikPen Inject 8 Units into the skin 3 (three) times daily.   05/18/2018 at Unknown time  . iron polysaccharides (NIFEREX) 150 MG capsule Take 150 mg by mouth daily.    05/18/2018 at Unknown time  . losartan (COZAAR) 100 MG tablet Take 1 tablet (100 mg total) by mouth daily. 90 tablet 3 05/18/2018 at Unknown time  . pantoprazole (PROTONIX) 40 MG tablet Take 40 mg by mouth daily.   0 05/18/2018 at Unknown time  . Potassium 99 MG TABS Take 1 tablet by mouth every other day.   Past Week at Unknown time  . rosuvastatin (CRESTOR) 20 MG tablet Take 20 mg by mouth daily.   05/18/2018 at Unknown time  . Travoprost, BAK Free, (TRAVATAN) 0.004 % SOLN ophthalmic solution Place 1 drop into both eyes at bedtime.   05/18/2018 at Unknown time  . vitamin B-12 (CYANOCOBALAMIN) 500 MCG tablet  Take 500 mcg by mouth daily.   05/18/2018 at Unknown time  . furosemide (LASIX) 80 MG tablet Take 1 tablet (80 mg total) by mouth daily. (Patient taking differently: Take 80 mg by mouth  daily as needed for fluid. ) 30 tablet 6 unknown    Family History  Problem Relation Age of Onset  . Diabetes Brother   . Hypertension Sister      Review of Systems:   ROS             The patient denies any recent falls or chest trauma.  The patient has had a influenza vaccine this past fall.    Cardiac Review of Systems: Y or  [    ]= no  Chest Pain [    ]  Resting SOB [   ] Exertional SOB  [  ]  Orthopnea [  ]   Pedal Edema [   ]    Palpitations [  ] Syncope  [  ]   Presyncope [   ]  General Review of Systems: [Y] = yes [  ]=no Constitional: recent weight change Blue.Reese ]; anorexia [  ]; fatigue Blue.Reese  ]; nausea [  ]; night sweats [  ]; fever [  ]; or chills [  ]                                                               Dental: Last Dentist visit: Unknown  Eye : blurred vision [  ]; diplopia [   ]; vision changes [  ];  Amaurosis fugax[  ]; Resp: cough [  ];  wheezing[  ];  hemoptysis[ y ]; shortness of breath[y  ]; paroxysmal nocturnal dyspnea[  ]; dyspnea on exertion[  ]; or orthopnea[  ];  GI:  gallstones[  ], vomiting[  ];  dysphagia[  ]; melena[  ];  hematochezia [  ]; heartburn[  ];   Hx of  Colonoscopy[  ]; GU: kidney stones [  ]; hematuria[  ];   dysuria [  ];  nocturia[  ];  history of     obstruction [  ]; urinary frequency Blue.Reese  ]             Skin: rash, swelling[  ];, hair loss[  ];  peripheral edema[  ];  or itching[  ]; Musculosketetal: myalgias[  ];  joint swelling[  ];  joint erythema[  ];  joint pain[  ];  back pain[y  ];  Heme/Lymph: bruising[ y ];  bleeding[  ];  anemia[  ];  Neuro: TIA[  ];  headaches[  ];  stroke[  ];  vertigo[  ];  seizures[  ];   paresthesias[  ];  difficulty walking[  ];  Psych:depression[  ]; anxiety[  ];  Endocrine: diabetes[ y ];  thyroid dysfunction[  ];                 Physical Exam: BP (!) 144/61 (BP Location: Left Arm)   Pulse 70   Temp 97.8 F (36.6 C) (Oral)   Resp 14   Ht 6\' 1"  (1.854 m)   Wt 105.7 kg   SpO2 99%   BMI 30.74 kg/m   Elderly obese male no acute distress Edentulous with no dental disease No cervical adenopathy palpable Bronchial breath sounds at right base Heart rhythm regular without murmur Abdomen soft without pulsatile mass Lower extremities with atrophic skin changes and 2+ edema Peripheral pulses intact 1-2+  Diagnostic Studies & Laboratory data:     Recent Radiology Findings:   No results found. CT scan shows consolidative process right lower lobe and the basilar segments with chronic pleural thickening seen on previous CT scans and a very small loculated probable parapneumonic  effusion which is too small to drain with thoracentesis and does not need VATS drainage and decortication.  No evidence of a discrete mass.     Recent Lab Findings: Lab Results  Component Value Date   WBC 10.2 05/19/2018   HGB 9.4 (L) 05/19/2018   HCT 31.9 (L) 05/19/2018   PLT 221 05/19/2018   GLUCOSE 125 (H) 05/19/2018   ALT 15 02/23/2018   AST 17 02/23/2018   NA 140 05/19/2018   K 4.1 05/19/2018   CL 106 05/19/2018   CREATININE 1.21 05/19/2018   BUN 16 05/19/2018   CO2 25 05/19/2018   HGBA1C 5.9 (H) 05/19/2018      Assessment / Plan:   Patient has right lower lobe necrotizing pneumonia with consolidation and scattered gas bubbles.  There is a very small loculated effusion which does not require VATS and is too small for thoracentesis.  He has had right pleural thickening at the right base for several years.  Best therapy is broad-spectrum antibiotics and observation and follow-up CT scan in 2-3 weeks.  He is not toxic.  He has had some associated hemoptysis.  For that reason I would recommend having pulmonary medicine assess him for diagnostic bronchoscopy at which time airway cultures can be obtained.  Will  follow.     @ME1 @ 05/20/2018 6:16 PM

## 2018-05-20 NOTE — Progress Notes (Signed)
SATURATION QUALIFICATIONS: (This note is used to comply with regulatory documentation for home oxygen)  Patient Saturations on Room Air at Rest = 100%  Patient Saturations on Room Air while Ambulating = 93%  Patient Saturations on N/A Liters of oxygen while Ambulating = N/A  Please briefly explain why patient needs home oxygen: patient does not need oxygen at home, oxygen stayed at or above 93% while ambulating the entire walk.

## 2018-05-20 NOTE — Progress Notes (Signed)
CT surgery  Patient's admission chest CT scan images personally reviewed.  Patient discussed with [facility] hospitalist late January 3 for coordination of care.  The patient has necrotizing right lower lobe pneumonia with pleural thickening/ inflammation and a very small effusion which is too small for drainage and would not benefit from VATS.  It is not likely the patient has neoplastic disease.  Would continue course of IV antibiotics and repeat CT scan of chest in several days.  The patient does not need transfer to [facility] Full consult to follow.
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
## 2018-05-20 NOTE — Telephone Encounter (Signed)
Pt was admitted so cancel appt 05/23/18

## 2018-05-20 NOTE — Progress Notes (Addendum)
Pharmacy Antibiotic Note  Roger Blackwell is a 83 y.o. male admitted on 05/19/2018 with pneumonia.  Pharmacy has been consulted for vancomycin and cefepime dosing.  PMH includes heart failure, type 2 diabetes, hypertension, CKD stage III, CAD with a stent in place 2002, morbid obesity, gout, hyperlipidemia admitted with complaints of hemoptysis for the last 4 days.     Today, 05/20/2018:  Updated weight in chart  Afebrile, WBC wnl  SCr not rechecked  Plan:  Continue cefepime 2g IV q12 hr  Will adjust vancomycin to 1500 mg IV q24 hr (est AUC 513 w/ SCr 1.21)  Measure vancomycin AUC as indicated  Flagyl per MD  Height: 6\' 1"  (185.4 cm) Weight: 233 lb (105.7 kg) IBW/kg (Calculated) : 79.9  Temp (24hrs), Avg:98.6 F (37 C), Min:97.8 F (36.6 C), Max:99.2 F (37.3 C)  Recent Labs  Lab 05/19/18 1406  WBC 10.2  CREATININE 1.21    Estimated Creatinine Clearance: 62.1 mL/min (by C-G formula based on SCr of 1.21 mg/dL).    Allergies  Allergen Reactions  . Ivp Dye [Iodinated Diagnostic Agents]     Antimicrobials this admission: 1/3 Vancomycin >>  1/3 cefepime >>  1/3 metronidazole >>   Dose adjustments this admission: 1/4: adjust vancomycin to 1500 q24  Microbiology results: 1/3 BCx: ngtd    Thank you for allowing pharmacy to be a part of this patient's care.  Reuel Boom, PharmD, BCPS [phone] 05/20/2018, 1:42 PM

## 2018-05-21 DIAGNOSIS — E440 Moderate protein-calorie malnutrition: Secondary | ICD-10-CM

## 2018-05-21 LAB — CREATININE, SERUM
Creatinine, Ser: 1.19 mg/dL (ref 0.61–1.24)
GFR calc Af Amer: >60 mL/min (ref >60)
GFR calc non Af Amer: 57 mL/min — ABNORMAL LOW (ref >60)

## 2018-05-21 LAB — GLUCOSE, CAPILLARY
Glucose-Capillary: 110 mg/dL — ABNORMAL HIGH (ref 70–99)
Glucose-Capillary: 113 mg/dL — ABNORMAL HIGH (ref 70–99)
Glucose-Capillary: 113 mg/dL — ABNORMAL HIGH (ref 70–99)
Glucose-Capillary: 114 mg/dL — ABNORMAL HIGH (ref 70–99)

## 2018-05-21 MED ORDER — ENSURE ENLIVE PO LIQD
237.0000 mL | Freq: Two times a day (BID) | ORAL | Status: DC
  Administered 2018-05-22 – 2018-05-25 (×5): 237 mL via ORAL

## 2018-05-21 MED ORDER — GUAIFENESIN-DM 100-10 MG/5ML PO SYRP
10.0000 mL | ORAL_SOLUTION | ORAL | Status: DC | PRN
  Administered 2018-05-21 – 2018-05-25 (×4): 10 mL via ORAL
  Filled 2018-05-21 (×5): qty 10

## 2018-05-21 NOTE — Progress Notes (Signed)
PROGRESS NOTE    Roger Blackwell  TMH:[id number] DOB: 1935-11-09 DOA: 05/19/2018 PCP: [name], MD  Brief Narrative:  83 y.o.malewith medical history significant ofchronic diastolicheart failure, type 2 diabetes, hypertension, CKD stage III, CAD with a stent in place 2002, morbid obesity, gout, hyperlipidemia admitted with complaints of hemoptysis for the last 4 days. Prior to that he was in his usual state of health. He does not know if he had a fever at home he did not check or feel feverish or had Reiger's or chills. He went to his PCP yesterday with the same complaint and they gave him Zithromax and had a chest CT done and the results came out today showing that he has right-sided consolidation with gas gas and pleural effusion. PCP sent him to the ER. He is says ex-smoker he has a on and off cough but this is a first time he has been having hemoptysis. He does complain of shortness of breath outside of his congestive heart failure. His appetite is diminished he has lost over 50 pounds in the last 1 year unintentionally. He had nausea and vomiting x1 today has occasional diarrhea. Denies abdominal pain chest pain. Denies headache changes with his vision. Denies any urinary complaints. Patient lives at home with his wife. He has not had any recent sick contacts, recent antibiotics, recent travel, or exposure to TB that he knows of. Lower extremity swelling which has not changed. He denies any use of alcohol. 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 His vital signs in the ER Pressure 1 4579 pulse is 71 respiration 19 temperature 98.3 pulse ox 99% on room air. Labs show sodium 140 potassium 4.1 BUN 16 creatinine 1.21 white count 10 hemoglobin 9.4 platelet count 221. CT of the chest shows extensive consolidation and airspace disease in the right lower lobe, small pockets of gas along the periphery of the right lung  consolidation concerning for lung necrosis. Right pleural effusion that contains gas, question severe infectious or inflammatory process versus neoplasm. Nodular lesion seen in both lungs small amount of gas in the right pleural space raises concern for bronchopleural fistula. Scattered pleural calcification with plaques findings: Compatible with prior asbestos exposure 0.9 cm nodule in the left upper lobe and poorly defined pleural-based nodular lesion or mass in the left upper lobe. Review of Systems:Difficult for weight loss and decreased appetite  ambulatory Status:He is ambulatory at baseline he lives at home with his wife  Assessment & Plan:   Active Problems:   Cough with hemoptysis   Acute diastolic CHF (congestive heart failure) (HCC)   Gout   Weight loss   Hemoptysis    #1 acute necrotizing right lower lobe pneumonia patient presented with hemoptysis continues to have hemoptysis and shortness of breath and cough.  We will continue empiric broad-spectrum IV antibiotics.  CT surgery consulted recommends broad-spectrum antibiotics and follow-up CT in 2 to 3 weeks.  Will consult pulmonary for possible bronc.    #2 chronic congestive heart failure diastolic stable  #3 stage III CKD stable  #4 type 2 diabetes with CKD continue SSI  Severity patient has necrotizing pneumonia and needs IV antibiotics and close follow-up he is at high risk for respiratory compromise and sepsis.   Estimated body mass index is 30.74 kg/m as calculated from the following:   Height as of this encounter: 6\' 1"  (1.854 m).   Weight as of this encounter: 105.7 kg.  DVT prophylaxis: SCD  Code Status: Full code Family Communication: Wife not in the room Disposition Plan: Pending clinical improvement Consultants:  Cardiothoracic surgery  Procedures: None Antimicrobials: Cefepime and Flagyl  Subjective: Still continues to have hemoptysis but better   Objective: Vitals:   05/20/18 0523  05/20/18 1316 05/20/18 2129 05/21/18 0531  BP: (!) 144/67 (!) 144/61 (!) 137/51 129/66  Pulse: 64 70 65 72  Resp: 18 14 16 18   Temp: 98.8 F (37.1 C) 97.8 F (36.6 C) 97.8 F (36.6 C) 98.9 F (37.2 C)  TempSrc: Oral Oral Oral Oral  SpO2: 97% 99% 98% 95%  Weight:      Height:        Intake/Output Summary (Last 24 hours) at 05/21/2018 1213 Last data filed at 05/20/2018 1400 Gross per 24 hour  Intake 200 ml  Output -  Net 200 ml   Filed Weights   05/19/18 2214  Weight: 105.7 kg    Examination:  General exam: Appears calm and comfortable  Respiratory system: Coarse breath sounds at the right base to auscultation. Respiratory effort normal. Cardiovascular system: S1 & S2 heard, RRR. No JVD, murmurs, rubs, gallops or clicks. No pedal edema. Gastrointestinal system: Abdomen is nondistended, soft and nontender. No organomegaly or masses felt. Normal bowel sounds heard. Central nervous system: Alert and oriented. No focal neurological deficits. Extremities: 2+ edema Skin: No rashes, lesions or ulcers Psychiatry: Judgement and insight appear normal. Mood & affect appropriate.     Data Reviewed: I have personally reviewed following labs and imaging studies  CBC: Recent Labs  Lab 05/19/18 1406  WBC 10.2  HGB 9.4*  HCT 31.9*  MCV 87.9  PLT 283   Basic Metabolic Panel: Recent Labs  Lab 05/19/18 1406 05/21/18 0552  NA 140  --   K 4.1  --   CL 106  --   CO2 25  --   GLUCOSE 125*  --   BUN 16  --   CREATININE 1.21 1.19  CALCIUM 9.1  --    GFR: Estimated Creatinine Clearance: 63.2 mL/min (by C-G formula based on SCr of 1.19 mg/dL). Liver Function Tests: No results for input(s): AST, ALT, ALKPHOS, BILITOT, PROT, ALBUMIN in the last 168 hours. No results for input(s): LIPASE, AMYLASE in the last 168 hours. No results for input(s): AMMONIA in the last 168 hours. Coagulation Profile: No results for input(s): INR, PROTIME in the last 168 hours. Cardiac Enzymes: No  results for input(s): CKTOTAL, CKMB, CKMBINDEX, TROPONINI in the last 168 hours. BNP (last 3 results) No results for input(s): PROBNP in the last 8760 hours. HbA1C: Recent Labs    05/19/18 1918  HGBA1C 5.9*   CBG: Recent Labs  Lab 05/20/18 1159 05/20/18 1643 05/20/18 2134 05/21/18 0724 05/21/18 1153  GLUCAP 113* 106* 136* 110* 113*   Lipid Profile: No results for input(s): CHOL, HDL, LDLCALC, TRIG, CHOLHDL, LDLDIRECT in the last 72 hours. Thyroid Function Tests: No results for input(s): TSH, T4TOTAL, FREET4, T3FREE, THYROIDAB in the last 72 hours. Anemia Panel: No results for input(s): VITAMINB12, FOLATE, FERRITIN, TIBC, IRON, RETICCTPCT in the last 72 hours. Sepsis Labs: No results for input(s): PROCALCITON, LATICACIDVEN in the last 168 hours.  Recent Results (from the past 240 hour(s))  Culture, blood (routine x 2)     Status: None (Preliminary result)   Collection Time: 05/19/18  7:17 PM  Result Value Ref Range Status   Specimen Description BLOOD BLOOD RIGHT ARM  Final   Special Requests   Final  BOTTLES DRAWN AEROBIC ONLY Blood Culture adequate volume Performed at [facility] [address] [place], [place] [address]    Culture NO GROWTH 2 DAYS  Final   Report Status PENDING  Incomplete  Culture, blood (routine x 2)     Status: None (Preliminary result)   Collection Time: 05/19/18  7:18 PM  Result Value Ref Range Status   Specimen Description BLOOD RIGHT ANTECUBITAL  Final   Special Requests   Final    BOTTLES DRAWN AEROBIC ONLY Blood Culture adequate volume Performed at [facility] [address], [place], [place] [address]    Culture NO GROWTH 2 DAYS  Final   Report Status PENDING  Incomplete  MRSA PCR Screening     Status: None   Collection Time: 05/20/18  5:51 PM  Result Value Ref Range Status   MRSA by PCR NEGATIVE NEGATIVE Final    Comment:        The GeneXpert MRSA Assay (FDA approved for NASAL  specimens only), is one component of a comprehensive MRSA colonization surveillance program. It is not intended to diagnose MRSA infection nor to guide or monitor treatment for MRSA infections. Performed at [facility], [place] [address], [place], [place] [address]          Radiology Studies: No results found.      Scheduled Meds: . allopurinol  900 mg Oral Daily  . insulin aspart  0-9 Units Subcutaneous TID WC  . latanoprost  1 drop Both Eyes QHS   Continuous Infusions: . ceFEPime (MAXIPIME) IV Stopped (05/20/18 0700)  . ceFEPime (MAXIPIME) IV 2 g (05/21/18 0532)  . metronidazole 500 mg (05/21/18 0413)  . vancomycin 1,500 mg (05/21/18 0855)     LOS: 1 day      Georgette Shell, MD [facility] If 7PM-7AM, please contact night-coverage [email or web address] Password [email or web address] 05/21/2018, 12:14 PM

## 2018-05-21 NOTE — Progress Notes (Signed)
PHARMACY NOTE -  Cefepime  Pharmacy has been assisting with dosing of Cefepime for PNA.  Dosage remains stable at 2g IV q12 hr and need for further dosage adjustment appears unlikely at present given stable renal function  Pharmacy will sign off, following peripherally for culture results or dose adjustments. Please reconsult if a change in clinical status warrants re-evaluation of dosage.  Reuel Boom, PharmD, BCPS [phone] 05/21/2018, 12:20 PM

## 2018-05-21 NOTE — Progress Notes (Signed)
Initial Nutrition Assessment  DOCUMENTATION CODES:   Non-severe (moderate) malnutrition in context of chronic illness, Obesity unspecified  INTERVENTION:   Provide Ensure Enlive po BID, each supplement provides 350 kcal and 20 grams of protein  NUTRITION DIAGNOSIS:   Moderate Malnutrition related to chronic illness(CHF) as evidenced by percent weight loss, mild fat depletion, moderate muscle depletion.  GOAL:   Patient will meet greater than or equal to 90% of their needs  MONITOR:   PO intake, Supplement acceptance, Labs, Weight trends, I & O's  REASON FOR ASSESSMENT:   Malnutrition Screening Tool    ASSESSMENT:   83 y.o. male with medical history significant of chronic diastolic heart failure, type 2 diabetes, hypertension, CKD stage III, CAD with a stent in place 2002, morbid obesity, gout, hyperlipidemia admitted with complaints of hemoptysis for the last 4 days.  Patient reports not eating well PTA d/t SOB for a few days but overall has a decreased appetite.  Pt did eat 100% of breakfast 1/4. Would benefit from Ensure supplements to aid caloric intake.   Pt has reported ~50 lb of weight loss. Per weight history, pt has lost 64 lb since 08/01/17 (22% wt loss x 9.5 months, significant for time frame).   Medications reviewed. Labs reviewed: CBGs: 110-113   NUTRITION - FOCUSED PHYSICAL EXAM:    Most Recent Value  Orbital Region  Mild depletion  Upper Arm Region  Mild depletion  Thoracic and Lumbar Region  Unable to assess  Buccal Region  Mild depletion  Temple Region  Mild depletion  Clavicle Bone Region  Moderate depletion  Clavicle and Acromion Bone Region  Moderate depletion  Scapular Bone Region  Moderate depletion  Dorsal Hand  Mild depletion  Patellar Region  Unable to assess  Anterior Thigh Region  Unable to assess  Posterior Calf Region  Unable to assess  Edema (RD Assessment)  None       Diet Order:   Diet Order            Diet regular Room  service appropriate? Yes; Fluid consistency: Thin  Diet effective now              EDUCATION NEEDS:   No education needs have been identified at this time  Skin:  Skin Assessment: Reviewed RN Assessment  Last BM:  1/4  Height:   Ht Readings from Last 1 Encounters:  05/19/18 6\' 1"  (1.854 m)    Weight:   Wt Readings from Last 1 Encounters:  05/19/18 105.7 kg    Ideal Body Weight:  83.6 kg  BMI:  Body mass index is 30.74 kg/m.  Estimated Nutritional Needs:   Kcal:  2200-2400  Protein:  100-110g  Fluid:  2L/day   Clayton Bibles, MS, RD, LDN [facility] Pager: [phone] After Hours Pager: [phone]

## 2018-05-22 ENCOUNTER — Inpatient Hospital Stay: Payer: Self-pay

## 2018-05-22 DIAGNOSIS — R042 Hemoptysis: Secondary | ICD-10-CM

## 2018-05-22 LAB — GLUCOSE, CAPILLARY
GLUCOSE-CAPILLARY: 103 mg/dL — AB (ref 70–99)
Glucose-Capillary: 135 mg/dL — ABNORMAL HIGH (ref 70–99)
Glucose-Capillary: 137 mg/dL — ABNORMAL HIGH (ref 70–99)

## 2018-05-22 LAB — EXPECTORATED SPUTUM ASSESSMENT W REFEX TO RESP CULTURE

## 2018-05-22 LAB — EXPECTORATED SPUTUM ASSESSMENT W GRAM STAIN, RFLX TO RESP C

## 2018-05-22 MED ORDER — SODIUM CHLORIDE 0.9 % IV SOLN
2.0000 g | INTRAVENOUS | Status: DC
  Administered 2018-05-22 – 2018-05-24 (×3): 2 g via INTRAVENOUS
  Filled 2018-05-22: qty 20
  Filled 2018-05-22 (×3): qty 2

## 2018-05-22 NOTE — Consult Note (Addendum)
NAME:  Roger Blackwell, MRN:  [id number], DOB:  Nov 07, 1935, LOS: 2 ADMISSION DATE:  05/19/2018, CONSULTATION DATE:  05/22/18 REFERRING MD:  Dr. Zigmund Daniel, CHIEF COMPLAINT:  Hemoptysis  Brief History   [name] admitted 1/3 with 4 day history of hemoptysis.  CXR at PCP office 1/2 was concerning for PNA and he was given zithromax.  CT of the chest was ordered which showed right sided consolidation with gas and associated pleural effusion. The patient was admitted for further evaluation.  Seen by CVTS for surgical evaluation and felt not to be a surgical candidate.    History of present illness   [name] admitted 1/3 with 4 day history of hemoptysis.  CXR at PCP office 1/2 was concerning for PNA and he was given zithromax.  CT of the chest was ordered which showed right sided consolidation with gas and associated pleural effusion. The patient was admitted for further evaluation.  Seen by CVTS for surgical evaluation and felt not to be a surgical candidate.  Prior history of left PNA with empyema requiring left thoracotomy for drainage (per Dr. Arlyce Dice). PCCM consulted for evaluation for bronchoscopy for BAL.     Pt reports he began feeling weak approximately one week prior to admit.  He states he has lost 40-50 lbs over the past year without trying.  He states his appetite has not been great.  Reports occasional night sweats.  States he has difficulty with some food textures (cornbread) & has coughing after.  Worked for [employer] around [employer].  Denies known fevers, chills.  4 days prior to admit, he began coughing up blood and that is what prompted him to visit his PCP.    Past Medical History  Left empyema s/p thoracotomy ~ 2000 Prostate CA - on lupron Former Smoker - began age 66, quit 1994 (15 years, 1ppd at heaviest) DM  CHF  CAD  Morbid Obesity  HTN  Significant Hospital Events   1/03  Admit with hemoptysis, necrotizing PNA 1/04  Seen by CVTS, felt not to be a surgical candidate  1/06   PCCM consulted   Consults:  PCCM 1/6  Procedures:    Significant Diagnostic Tests:  CT Chest 1/2 >> extensive RLL consolidation, small pockets of gas worrisome for lung necrosis, small complex right pleural effusion that contains gas -raises concern for bronchopleural fistula, nodular lesions in both lungs, scattered pleural calcifications with plaques compatible with prior asbestos exposure, ill defined pleural based mass in LUL  Micro Data:  BCx2 1/3 >>  Sputum 1/3 >>   Antimicrobials:  Cefepime 1/5 >>  Flagyl 1/5 >>   Interim history/subjective:  As above  Objective   Blood pressure (!) 117/59, pulse 84, temperature 98 F (36.7 C), temperature source Oral, resp. rate 16, height _0  (1.854 m), weight 105.7 kg, SpO2 98 %.        Intake/Output Summary (Last 24 hours) at 05/22/2018 2800 Last data filed at 05/22/2018 3491 Gross per 24 hour  Intake 1469.86 ml  Output 325 ml  Net 1144.86 ml   Filed Weights   05/19/18 2214  Weight: 105.7 kg    Examination: General: elderly male lying in bed in NAD, appears younger than stated age  65: MM pink/moist Neuro: AAOx4, speech clear, MAE  CV: s1s2 rrr, no m/r/g PULM: even/non-labored, lungs bilaterally clear anterior, diminished bibasilar with crackles  PH:XTAV, non-tender, bsx4 active  Extremities: warm/dry, no edema  Skin: no rashes or lesions  Resolved Hospital Problem  list      Assessment & Plan:   [name], former smoker, admitted with concern for necrotizing PNA.  Additionally, found to have left pleural based mass, pleural thickening and pleural plaques.  He has had 40-50lb weight loss, decreased appetite, occasional night sweats.  He has had pleural thickening dating back to 2014.  Concern for possible PNA but constellation of symptoms also worrisome for malignancy.  Must also consider aspiration.      Necrotizing PNA with Hemoptysis -R dense consolidation with pleural effusion with gas P: Continue IV  antibiotics while inpatient  Plan for 4-6 weeks therapy  Would consider holding PICC line.  Consider Augmentin for potential polymicrobial PNA.   Follow up with Dr. [name] in the office in 4 weeks.  Stay on therapy until seen by Dr. [name]. Appt arranged. Will need repeat imaging Plan for FOB in am 1/7 at 1000, NPO after MN for BAL & biopsy   LUL Pleural Based Mass  P: May need CT guided biopsy of mass  Pleural Plaques -no hx of known asbestos exposure  P: Follow up CT imaging  Hx of Empyema -around 2000, s/p thoracotomy by Dr. Adele Dan practice:  Diet: As tolerated  Pain/Anxiety/Delirium protocol (if indicated): n/a VAP protocol (if indicated): n/a DVT prophylaxis: ambulatory  GI prophylaxis: n/a  Glucose control: per primary  Mobility: As tolerated  Code Status: Full Code  Family Communication: No family available.  Patient updated on plan of care.  Disposition: Floor  Labs   CBC: Recent Labs  Lab 05/19/18 1406  WBC 10.2  HGB 9.4*  HCT 31.9*  MCV 87.9  PLT 518    Basic Metabolic Panel: Recent Labs  Lab 05/19/18 1406 05/21/18 0552  NA 140  --   K 4.1  --   CL 106  --   CO2 25  --   GLUCOSE 125*  --   BUN 16  --   CREATININE 1.21 1.19  CALCIUM 9.1  --    GFR: Estimated Creatinine Clearance: 63.2 mL/min (by C-G formula based on SCr of 1.19 mg/dL). Recent Labs  Lab 05/19/18 1406  WBC 10.2    Liver Function Tests: No results for input(s): AST, ALT, ALKPHOS, BILITOT, PROT, ALBUMIN in the last 168 hours. No results for input(s): LIPASE, AMYLASE in the last 168 hours. No results for input(s): AMMONIA in the last 168 hours.  ABG No results found for: PHART, PCO2ART, PO2ART, HCO3, TCO2, ACIDBASEDEF, O2SAT   Coagulation Profile: No results for input(s): INR, PROTIME in the last 168 hours.  Cardiac Enzymes: No results for input(s): CKTOTAL, CKMB, CKMBINDEX, TROPONINI in the last 168 hours.  HbA1C: Hgb A1c MFr Bld  Date/Time Value Ref Range  Status  05/19/2018 07:18 PM 5.9 (H) 4.8 - 5.6 % Final    Comment:    (NOTE) Pre diabetes:          5.7%-6.4% Diabetes:              >6.4% Glycemic control for   <7.0% adults with diabetes   07/20/2007 10:45 AM (H)  Final   8.1 (NOTE)   The ADA recommends the following therapeutic goals for glycemic   control related to Hgb A1C measurement:   Goal of Therapy:   < 7.0% Hgb A1C   Action Suggested:  > 8.0% Hgb A1C   Ref:  Diabetes Care, 22, Suppl. 1, 1999    CBG: Recent Labs  Lab 05/21/18 0724 05/21/18 1153 05/21/18  1643 05/21/18 2026 05/22/18 0732  GLUCAP 110* 113* 113* 114* 103*    Review of Systems: Positives in bold  Gen: Denies fever, chills, weight change, fatigue, night sweats HEENT: Denies blurred vision, double vision, hearing loss, tinnitus, sinus congestion, rhinorrhea, sore throat, neck stiffness, dysphagia PULM: Denies shortness of breath, cough, sputum production, hemoptysis, wheezing CV: Denies chest pain, edema, orthopnea, paroxysmal nocturnal dyspnea, palpitations GI: Denies abdominal pain, nausea, vomiting, diarrhea, hematochezia, melena, constipation, change in bowel habits GU: Denies dysuria, hematuria, polyuria, oliguria, urethral discharge Endocrine: Denies hot or cold intolerance, polyuria, polyphagia or appetite change Derm: Denies rash, dry skin, scaling or peeling skin change Heme: Denies easy bruising, bleeding, bleeding gums Neuro: Denies headache, numbness, weakness, slurred speech, loss of memory or consciousness  Past Medical History  He,  has a past medical history of CAD (coronary artery disease), CHF (congestive heart failure) ([place]), DM (diabetes mellitus) ([place]), Dyslipidemia, Gout, Morbid obesity ([place]), and Systemic hypertension.   Surgical History    Past Surgical History:  Procedure Laterality Date  . BACK SURGERY    . CORONARY ANGIOPLASTY WITH STENT PLACEMENT  12/26/2000   80% prox. circumflex lesion w/successful stent placement.  Marland Kitchen NM  MYOVIEW LTD  08/31/2010   Normal  . PERICARDIOCENTESIS    . PROSTATE SURGERY    . US ECHOCARDIOGRAPHY  08/31/2010   EF 50-55%,RV mildly dilated,mild aortic root dilatation     Social History   reports that he quit smoking about 25 years ago. He has never used smokeless tobacco. He reports current alcohol use. He reports that he does not use drugs.   Family History   His family history includes Diabetes in his brother; Hypertension in his sister.   Allergies Allergies  Allergen Reactions  . Ivp Dye [Iodinated Diagnostic Agents]      Home Medications  Prior to Admission medications   Medication Sig Start Date End Date Taking? Authorizing Provider  allopurinol (ZYLOPRIM) 300 MG tablet Take 900 mg by mouth daily.  09/15/16  Yes [provider]  aspirin 81 MG tablet Take 81 mg by mouth daily.   Yes [provider]  ibuprofen (ADVIL,MOTRIN) 200 MG tablet Take 200 mg by mouth every 6 (six) hours as needed for headache.   Yes [provider]  insulin detemir (LEVEMIR) 100 UNIT/ML injection Inject 30 Units into the skin daily.   Yes [provider]  insulin lispro (HUMALOG KWIKPEN) 100 UNIT/ML KwikPen Inject 8 Units into the skin 3 (three) times daily.   Yes [provider]  iron polysaccharides (NIFEREX) 150 MG capsule Take 150 mg by mouth daily.    Yes [provider]  losartan (COZAAR) 100 MG tablet Take 1 tablet (100 mg total) by mouth daily. 03/17/18 03/12/19 Yes Croitoru, Mihai, MD  pantoprazole (PROTONIX) 40 MG tablet Take 40 mg by mouth daily.  06/04/16  Yes [provider]  Potassium 99 MG TABS Take 1 tablet by mouth every other day.   Yes [provider]  rosuvastatin (CRESTOR) 20 MG tablet Take 20 mg by mouth daily.   Yes [provider]  Travoprost, BAK Free, (TRAVATAN) 0.004 % SOLN ophthalmic solution Place 1 drop into both eyes at bedtime.   Yes [provider]  vitamin B-12 (CYANOCOBALAMIN) 500  MCG tablet Take 500 mcg by mouth daily.   Yes [provider]  furosemide (LASIX) 80 MG tablet Take 1 tablet (80 mg total) by mouth daily. Patient taking differently: Take 80 mg by mouth daily  as needed for fluid.  05/22/13   Croitoru, Dani Gobble, MD         Noe Gens, NP-C [facility] Pgr: [phone] or if no answer [phone] 05/22/2018, 9:23 AM

## 2018-05-22 NOTE — Telephone Encounter (Signed)
Appointment has been canceled per Dr. [name]. Message will be closed.

## 2018-05-22 NOTE — Progress Notes (Signed)
PROGRESS NOTE    Roger Blackwell  IOE:[id number] DOB: [birth year] DOA: 05/19/2018 PCP: [name], MD  Brief Narrative: 83 y.o.malewith medical history significant ofchronic diastolicheart failure, type 2 diabetes, hypertension, CKD stage III, CAD with a stent in place 2002, morbid obesity, gout, hyperlipidemia admitted with complaints of hemoptysis for the last 4 days. Prior to that he was in his usual state of health. He does not know if he had a fever at home he did not check or feel feverish or had Reiger's or chills. He went to his PCP yesterday with the same complaint and they gave him Zithromax and had a chest CT done and the results came out today showing that he has right-sided consolidation with gas gas and pleural effusion. PCP sent him to the ER. He is says ex-smoker he has a on and off cough but this is a first time he has been having hemoptysis. He does complain of shortness of breath outside of his congestive heart failure. His appetite is diminished he has lost over 50 pounds in the last 1 year unintentionally. He had nausea and vomiting x1 today has occasional diarrhea. Denies abdominal pain chest pain. Denies headache changes with his vision. Denies any urinary complaints. Patient lives at home with his wife. He has not had any recent sick contacts, recent antibiotics, recent travel, or exposure to TB that he knows of. Lower extremity swelling which has not changed. He denies any use of alcohol. 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 His vital signs in the ER Pressure 1 4579 pulse is 71 respiration 19 temperature 98.3 pulse ox 99% on room air. Labs show sodium 140 potassium 4.1 BUN 16 creatinine 1.21 white count 10 hemoglobin 9.4 platelet count 221. CT of the chest shows extensive consolidation and airspace disease in the right lower lobe, small pockets of gas along the periphery of the right lung  consolidation concerning for lung necrosis. Right pleural effusion that contains gas, question severe infectious or inflammatory process versus neoplasm. Nodular lesion seen in both lungs small amount of gas in the right pleural space raises concern for bronchopleural fistula. Scattered pleural calcification with plaques findings: Compatible with prior asbestos exposure 0.9 cm nodule in the left upper lobe and poorly defined pleural-based nodular lesion or mass in the left upper lobe. Review of Systems:Difficult for weight loss and decreased appetite  ambulatory Status:He is ambulatory at baseline he lives at home with his wife   Assessment & Plan:   Active Problems:   Cough with hemoptysis   Acute diastolic CHF (congestive heart failure) (HCC)   Gout   Weight loss   Hemoptysis   Malnutrition of moderate degree    #1 acute necrotizing right lower lobe pneumonia patient presented with hemoptysis continues to have hemoptysis and shortness of breath and cough. We will continue empiric broad-spectrum IV antibiotics. Appreciate pccm and id consult.id recommends picc line for iv antibiotics.rocephin and flagyl .For bronch tomorrow.need to rule out malignancy.  #2 chronic congestive heart failure diastolic stable  #3 stage III CKD stable  #4 type 2 diabetes with CKD continue SSI      Nutrition Problem: Moderate Malnutrition Etiology: chronic illness(CHF)     Signs/Symptoms: percent weight loss, mild fat depletion, moderate muscle depletion Percent weight loss: 22 %(x 9.5 months)    Interventions: Ensure Enlive (each supplement provides 350kcal and 20 grams of protein)  Estimated body mass index is 30.74 kg/m as calculated from the  following:   Height as of this encounter: 6\' 1"  (1.854 m).   Weight as of this encounter: 105.7 kg.  DVT prophylaxis:scd Family Communication: dw wife Disposition Plan: await bronch    Consultants:   pccm id  Procedures:    Antimicrobials: rocephin flagyl   Subjective: Still with hemoptysis sob   Objective: Vitals:   05/21/18 1304 05/21/18 2027 05/22/18 0513 05/22/18 1318  BP: (!) 148/69 (!) 153/67 (!) 117/59 (!) 142/58  Pulse: 68 68 84 71  Resp: 16 18 16  (!) 24  Temp: 97.8 F (36.6 C) 98.6 F (37 C) 98 F (36.7 C) 99.2 F (37.3 C)  TempSrc: Oral Oral Oral Oral  SpO2: 100% 96% 98% 98%  Weight:      Height:        Intake/Output Summary (Last 24 hours) at 05/22/2018 1457 Last data filed at 05/22/2018 0916 Gross per 24 hour  Intake 1469.86 ml  Output 225 ml  Net 1244.86 ml   Filed Weights   05/19/18 2214  Weight: 105.7 kg    Examination:  General exam: Appears calm and comfortable  Respiratory system: crackles right baseto auscultation. Respiratory effort normal. Cardiovascular system: S1 & S2 heard, RRR. No JVD, murmurs, rubs, gallops or clicks. No pedal edema. Gastrointestinal system: Abdomen is nondistended, soft and nontender. No organomegaly or masses felt. Normal bowel sounds heard. Central nervous system: Alert and oriented. No focal neurological deficits. Extremities: Symmetric 5 x 5 power. Skin: No rashes, lesions or ulcers Psychiatry: Judgement and insight appear normal. Mood & affect appropriate.     Data Reviewed: I have personally reviewed following labs and imaging studies  CBC: Recent Labs  Lab 05/19/18 1406  WBC 10.2  HGB 9.4*  HCT 31.9*  MCV 87.9  PLT 378   Basic Metabolic Panel: Recent Labs  Lab 05/19/18 1406 05/21/18 0552  NA 140  --   K 4.1  --   CL 106  --   CO2 25  --   GLUCOSE 125*  --   BUN 16  --   CREATININE 1.21 1.19  CALCIUM 9.1  --    GFR: Estimated Creatinine Clearance: 63.2 mL/min (by C-G formula based on SCr of 1.19 mg/dL). Liver Function Tests: No results for input(s): AST, ALT, ALKPHOS, BILITOT, PROT, ALBUMIN in the last 168 hours. No results for input(s): LIPASE, AMYLASE in the last 168 hours. No results for input(s): AMMONIA  in the last 168 hours. Coagulation Profile: No results for input(s): INR, PROTIME in the last 168 hours. Cardiac Enzymes: No results for input(s): CKTOTAL, CKMB, CKMBINDEX, TROPONINI in the last 168 hours. BNP (last 3 results) No results for input(s): PROBNP in the last 8760 hours. HbA1C: Recent Labs    05/19/18 1918  HGBA1C 5.9*   CBG: Recent Labs  Lab 05/21/18 1153 05/21/18 1643 05/21/18 2026 05/22/18 0732 05/22/18 1152  GLUCAP 113* 113* 114* 103* 135*   Lipid Profile: No results for input(s): CHOL, HDL, LDLCALC, TRIG, CHOLHDL, LDLDIRECT in the last 72 hours. Thyroid Function Tests: No results for input(s): TSH, T4TOTAL, FREET4, T3FREE, THYROIDAB in the last 72 hours. Anemia Panel: No results for input(s): VITAMINB12, FOLATE, FERRITIN, TIBC, IRON, RETICCTPCT in the last 72 hours. Sepsis Labs: No results for input(s): PROCALCITON, LATICACIDVEN in the last 168 hours.  Recent Results (from the past 240 hour(s))  Culture, blood (routine x 2)     Status: None (Preliminary result)   Collection Time: 05/19/18  7:17 PM  Result Value Ref Range Status  Specimen Description BLOOD BLOOD RIGHT ARM  Final   Special Requests   Final    BOTTLES DRAWN AEROBIC ONLY Blood Culture adequate volume Performed at [facility] [address] [place], [place] [address]    Culture NO GROWTH 3 DAYS  Final   Report Status PENDING  Incomplete  Culture, blood (routine x 2)     Status: None (Preliminary result)   Collection Time: 05/19/18  7:18 PM  Result Value Ref Range Status   Specimen Description BLOOD RIGHT ANTECUBITAL  Final   Special Requests   Final    BOTTLES DRAWN AEROBIC ONLY Blood Culture adequate volume Performed at [facility] [address], [place], [place] [address]    Culture NO GROWTH 3 DAYS  Final   Report Status PENDING  Incomplete  MRSA PCR Screening     Status: None   Collection Time: 05/20/18  5:51 PM  Result Value Ref Range  Status   MRSA by PCR NEGATIVE NEGATIVE Final    Comment:        The GeneXpert MRSA Assay (FDA approved for NASAL specimens only), is one component of a comprehensive MRSA colonization surveillance program. It is not intended to diagnose MRSA infection nor to guide or monitor treatment for MRSA infections. Performed at [facility], [place] [address] [place], [place] [address]   Expectorated sputum assessment w rflx to resp cult     Status: None   Collection Time: 05/22/18  9:24 AM  Result Value Ref Range Status   Specimen Description SPUTUM  Final   Special Requests NONE  Final   Sputum evaluation   Final    THIS SPECIMEN IS ACCEPTABLE FOR SPUTUM CULTURE Performed at [facility], [place] [address], [place], [place] [address]    Report Status 05/22/2018 FINAL  Final  Culture, respiratory     Status: None (Preliminary result)   Collection Time: 05/22/18  9:24 AM  Result Value Ref Range Status   Specimen Description   Final    SPUTUM Performed at [facility] [address] [place], [place] [address]    Special Requests   Final    NONE Reflexed from [id number] Performed at [facility] [address], [place], [place] [address]    Gram Stain   Final    NO WBC SEEN RARE SQUAMOUS EPITHELIAL CELLS PRESENT RARE GRAM POSITIVE COCCI Performed at [facility], [place] [address], [place],  [address]    Culture PENDING  Incomplete   Report Status PENDING  Incomplete         Radiology Studies: [place] Ekg Site Rite  Result Date: 05/22/2018 If Site Rite image not attached, placement could not be confirmed due to current cardiac rhythm.       Scheduled Meds: . allopurinol  900 mg Oral Daily  . feeding supplement (ENSURE ENLIVE)  237 mL Oral BID BM  . insulin aspart  0-9 Units Subcutaneous TID WC  . latanoprost  1 drop Both Eyes QHS   Continuous Infusions: . ceFEPime (MAXIPIME) IV 2 g (05/22/18  0512)  . metronidazole 500 mg (05/22/18 1443)     LOS: 2 days       Georgette Shell, MD [facility]   If 7PM-7AM, please contact night-coverage [email or web address] Password [email or web address] 05/22/2018, 2:57 PM

## 2018-05-22 NOTE — Consult Note (Signed)
[facility]  Total days of antibiotics 4               Reason for Consult: hemoptysis    Referring Physician: mathews  Active Problems:   Cough with hemoptysis   Acute diastolic CHF (congestive heart failure) (HCC)   Gout   Weight loss   Hemoptysis   Malnutrition of moderate degree    HPI: Roger Blackwell is a 82 y.o. male with hx of CAD, DM, HTN, HLD, and remote hx of left sided thoracotomy for empyema, admitted with c/o 4-5 day history of hemoptysis. He also reports fatigue and decreased appetite over the last few weeks. He has 40 lb unintentional weight loss in the past year.  He was seen by his PCP who started azithromycin and cxr showing large RLL infiltrate. Had follow up CT that showed dense right sided consolidation with possible effusion. He was referred to the hospital for further evaluation. CT also shows signs of gas within consolidation thus concern for bronchopulmonary fistula per my read. He was evalated by CT surgery who felt not a surgical candidate at this time and recommend medical management. Seen by pulmonary service who will performed bronchospcopy tomorrow.  He is comfortable on room air.  Looking forward to going home. Lives with his wife.  Past Medical History:  Diagnosis Date  . CAD (coronary artery disease)   . CHF (congestive heart failure) ([place])   . DM (diabetes mellitus) ([place])   . Dyslipidemia   . Gout   . Morbid obesity ([place])   . Systemic hypertension     Allergies:  Allergies  Allergen Reactions  . Ivp Dye [Iodinated Diagnostic Agents]     MEDICATIONS: . allopurinol  900 mg Oral Daily  . feeding supplement (ENSURE ENLIVE)  237 mL Oral BID BM  . insulin aspart  0-9 Units Subcutaneous TID WC  . latanoprost  1 drop Both Eyes QHS    Social History   Tobacco Use  . Smoking status: Former Smoker    Last attempt to quit: 05/16/1993    Years since quitting: 25.0  . Smokeless tobacco: Never Used  Substance Use  Topics  . Alcohol use: Yes    Comment: seldom  . Drug use: No    Family History  Problem Relation Age of Onset  . Diabetes Brother   . Hypertension Sister     Review of Systems  Constitutional: Negative for fever, chills, diaphoresis, activity change, appetite change, fatigue and unexpected weight change.  HENT: Negative for congestion, sore throat, rhinorrhea, sneezing, trouble swallowing and sinus pressure.  Eyes: Negative for photophobia and visual disturbance.  Respiratory: positive for cough, and occ. Hemoptysis. Negative chest tightness, shortness of breath, wheezing and stridor.  Cardiovascular: Negative for chest pain, palpitations and leg swelling.  Gastrointestinal: Negative for nausea, vomiting, abdominal pain, diarrhea, constipation, blood in stool, abdominal distention and anal bleeding.  Genitourinary: Negative for dysuria, hematuria, flank pain and difficulty urinating.  Musculoskeletal: Negative for myalgias, back pain, joint swelling, arthralgias and gait problem.  Skin: Negative for color change, pallor, rash and wound.  Neurological: Negative for dizziness, tremors, weakness and light-headedness.  Hematological: Negative for adenopathy. Does not bruise/bleed easily.  Psychiatric/Behavioral: Negative for behavioral problems, confusion, sleep disturbance, dysphoric mood, decreased concentration and agitation.     OBJECTIVE: Temp:  [98 F (36.7 C)-99.2 F (37.3 C)] 99.2 F (37.3 C) (01/06 1318) Pulse Rate:  [68-84] 71 (01/06 1318) Resp:  [16-24] 24 (01/06 1318)  BP: (117-153)/(58-67) 142/58 (01/06 1318) SpO2:  [96 %-98 %] 98 % (01/06 1318) Physical Exam  Constitutional: He is oriented to person, place, and time. He appears well-developed and well-nourished. No distress.  HENT:  Mouth/Throat: Oropharynx is clear and moist. No oropharyngeal exudate.  Cardiovascular: Normal rate, regular rhythm and normal heart sounds. Exam reveals no gallop and no friction rub.    No murmur heard.  Pulmonary/Chest: Effort normal and breath sounds normal. No respiratory distress. He has no wheezes. Mild decrease BS at right base Abdominal: Soft. Bowel sounds are normal. He exhibits no distension. There is no tenderness.  Lymphadenopathy:  He has no cervical adenopathy.  Neurological: He is alert and oriented to person, place, and time.  Skin: Skin is warm and dry. No rash noted. No erythema.  Psychiatric: He has a normal mood and affect. His behavior is normal.     LABS: Results for orders placed or performed during the hospital encounter of 05/19/18 (from the past 48 hour(s))  Glucose, capillary     Status: Abnormal   Collection Time: 05/20/18  4:43 PM  Result Value Ref Range   Glucose-Capillary 106 (H) 70 - 99 mg/dL  MRSA PCR Screening     Status: None   Collection Time: 05/20/18  5:51 PM  Result Value Ref Range   MRSA by PCR NEGATIVE NEGATIVE    Comment:        The GeneXpert MRSA Assay (FDA approved for NASAL specimens only), is one component of a comprehensive MRSA colonization surveillance program. It is not intended to diagnose MRSA infection nor to guide or monitor treatment for MRSA infections. Performed at [facility], [place] [address], [place], [place] [address]   Glucose, capillary     Status: Abnormal   Collection Time: 05/20/18  9:34 PM  Result Value Ref Range   Glucose-Capillary 136 (H) 70 - 99 mg/dL  Creatinine, serum     Status: Abnormal   Collection Time: 05/21/18  5:52 AM  Result Value Ref Range   Creatinine, Ser 1.19 0.61 - 1.24 mg/dL   GFR calc non Af Amer 57 (L) >60 mL/min   GFR calc Af Amer >60 >60 mL/min    Comment: Performed at [facility], [place] [address], [place], [place] [address]  Glucose, capillary     Status: Abnormal   Collection Time: 05/21/18  7:24 AM  Result Value Ref Range   Glucose-Capillary 110 (H) 70 - 99 mg/dL  Glucose, capillary     Status: Abnormal   Collection Time:  05/21/18 11:53 AM  Result Value Ref Range   Glucose-Capillary 113 (H) 70 - 99 mg/dL  Glucose, capillary     Status: Abnormal   Collection Time: 05/21/18  4:43 PM  Result Value Ref Range   Glucose-Capillary 113 (H) 70 - 99 mg/dL  Glucose, capillary     Status: Abnormal   Collection Time: 05/21/18  8:26 PM  Result Value Ref Range   Glucose-Capillary 114 (H) 70 - 99 mg/dL  Glucose, capillary     Status: Abnormal   Collection Time: 05/22/18  7:32 AM  Result Value Ref Range   Glucose-Capillary 103 (H) 70 - 99 mg/dL  Expectorated sputum assessment w rflx to resp cult     Status: None   Collection Time: 05/22/18  9:24 AM  Result Value Ref Range   Specimen Description SPUTUM    Special Requests NONE    Sputum evaluation      THIS SPECIMEN IS  ACCEPTABLE FOR SPUTUM CULTURE Performed at [facility], [place] [address] [place], [place] [address]    Report Status 05/22/2018 FINAL   Culture, respiratory     Status: None (Preliminary result)   Collection Time: 05/22/18  9:24 AM  Result Value Ref Range   Specimen Description      SPUTUM Performed at [facility], [place] [address], [place], [place] [address]    Special Requests      NONE Reflexed from [phone] Performed at [facility], [place] [address] [place], [place] [address]    Gram Stain      NO WBC SEEN RARE SQUAMOUS EPITHELIAL CELLS PRESENT RARE GRAM POSITIVE COCCI Performed at [facility], [place] [address] [place], [place] [address]    Culture PENDING    Report Status PENDING   Glucose, capillary     Status: Abnormal   Collection Time: 05/22/18 11:52 AM  Result Value Ref Range   Glucose-Capillary 135 (H) 70 - 99 mg/dL    MICRO: gpc on smear - pending Blood cx ngtd MRSA  By PCR is negative IMAGING: [place] Ekg Site Rite  Result Date: 05/22/2018 If Site Rite image not attached, placement could not be confirmed due to current cardiac rhythm.   Assessment/Plan:  83yo M  with necrotizing pneumonia to RLL  - surprisingly looks asymptomatic from necrotizing pneumonia, concern for other disease process that could mimic infection. Agree with plan for bronch for sampling to see if any signs of malignancy.  - please also send specimen for culture - recommend to narrow abtx to ceftriaxone 2gm iv daily plus continue metronidazole which can be oral 500mg  TID. - plan to treat for addn 14 days post discharge and then can continue with oral abtx thereafter - will need picc line - no need for MRSA coverage at this time -

## 2018-05-23 ENCOUNTER — Other Ambulatory Visit (HOSPITAL_COMMUNITY): Payer: Self-pay | Admitting: Respiratory Therapy

## 2018-05-23 ENCOUNTER — Inpatient Hospital Stay (HOSPITAL_COMMUNITY): Payer: Medicare Other

## 2018-05-23 ENCOUNTER — Encounter (HOSPITAL_COMMUNITY)
Admission: EM | Disposition: A | Discharge status: Home / Self Care | Payer: Self-pay | Source: Home / Self Care | Attending: Internal Medicine

## 2018-05-23 ENCOUNTER — Institutional Professional Consult (permissible substitution): Payer: Medicare Other | Admitting: Internal Medicine

## 2018-05-23 DIAGNOSIS — J189 Pneumonia, unspecified organism: Secondary | ICD-10-CM

## 2018-05-23 HISTORY — PX: VIDEO BRONCHOSCOPY: SHX5072

## 2018-05-23 LAB — BASIC METABOLIC PANEL
Anion gap: 7 (ref 5–15)
BUN: 19 mg/dL (ref 8–23)
CO2: 25 mmol/L (ref 22–32)
Calcium: 8.8 mg/dL — ABNORMAL LOW (ref 8.9–10.3)
Chloride: 106 mmol/L (ref 98–111)
Creatinine, Ser: 1.07 mg/dL (ref 0.61–1.24)
GFR calc Af Amer: >60 mL/min (ref >60)
GFR calc non Af Amer: >60 mL/min (ref >60)
GLUCOSE: 112 mg/dL — AB (ref 70–99)
Potassium: 3.8 mmol/L (ref 3.5–5.1)
Sodium: 138 mmol/L (ref 135–145)

## 2018-05-23 LAB — GLUCOSE, CAPILLARY
GLUCOSE-CAPILLARY: 112 mg/dL — AB (ref 70–99)
GLUCOSE-CAPILLARY: 167 mg/dL — AB (ref 70–99)
Glucose-Capillary: 110 mg/dL — ABNORMAL HIGH (ref 70–99)
Glucose-Capillary: 103 mg/dL — ABNORMAL HIGH (ref 70–99)
Glucose-Capillary: 153 mg/dL — ABNORMAL HIGH (ref 70–99)

## 2018-05-23 LAB — BODY FLUID CELL COUNT WITH DIFFERENTIAL
Eos, Fluid: 0 %
Lymphs, Fluid: 1 %
Monocyte-Macrophage-Serous Fluid: 47 % — ABNORMAL LOW (ref 50–90)
NEUTROPHIL FLUID: 52 % — AB (ref 0–25)
Total Nucleated Cell Count, Fluid: 5225 cu mm — ABNORMAL HIGH (ref 0–1000)

## 2018-05-23 LAB — CBC
HCT: 30.5 % — ABNORMAL LOW (ref 39.0–52.0)
Hemoglobin: 9.2 g/dL — ABNORMAL LOW (ref 13.0–17.0)
MCH: 26 pg (ref 26.0–34.0)
MCHC: 30.2 g/dL (ref 30.0–36.0)
MCV: 86.2 fL (ref 80.0–100.0)
Platelets: 243 10*3/uL (ref 150–400)
RBC: 3.54 MIL/uL — ABNORMAL LOW (ref 4.22–5.81)
RDW: 14.6 % (ref 11.5–15.5)
WBC: 9.8 10*3/uL (ref 4.0–10.5)
nRBC: 0 % (ref 0.0–0.2)

## 2018-05-23 SURGERY — VIDEO BRONCHOSCOPY WITHOUT FLUORO
Anesthesia: Moderate Sedation | Laterality: Bilateral

## 2018-05-23 MED ORDER — LIDOCAINE HCL 1 % IJ SOLN
INTRAMUSCULAR | Status: DC | PRN
  Administered 2018-05-23: 6 mL via RESPIRATORY_TRACT

## 2018-05-23 MED ORDER — MIDAZOLAM HCL (PF) 5 MG/ML IJ SOLN
INTRAMUSCULAR | Status: AC
  Filled 2018-05-23: qty 2

## 2018-05-23 MED ORDER — LIDOCAINE HCL URETHRAL/MUCOSAL 2 % EX GEL: 1.0000 "application " | Freq: Once | CUTANEOUS | Status: DC

## 2018-05-23 MED ORDER — SODIUM CHLORIDE 0.9 % IV SOLN
INTRAVENOUS | Status: DC
  Administered 2018-05-23: 10:00:00 via INTRAVENOUS

## 2018-05-23 MED ORDER — PHENYLEPHRINE HCL 0.25 % NA SOLN
NASAL | Status: DC | PRN
  Administered 2018-05-23: 2 via NASAL

## 2018-05-23 MED ORDER — MIDAZOLAM HCL (PF) 10 MG/2ML IJ SOLN
INTRAMUSCULAR | Status: DC | PRN
  Administered 2018-05-23 (×4): 1 mg via INTRAVENOUS

## 2018-05-23 MED ORDER — LIDOCAINE HCL URETHRAL/MUCOSAL 2 % EX GEL
CUTANEOUS | Status: DC | PRN
  Administered 2018-05-23: 1

## 2018-05-23 MED ORDER — BUTAMBEN-TETRACAINE-BENZOCAINE 2-2-14 % EX AERO: 1.0000 | INHALATION_SPRAY | Freq: Once | CUTANEOUS | Status: DC

## 2018-05-23 MED ORDER — ACETAMINOPHEN 325 MG PO TABS
650.0000 mg | ORAL_TABLET | Freq: Four times a day (QID) | ORAL | Status: DC | PRN
  Administered 2018-05-23: 650 mg via ORAL
  Filled 2018-05-23: qty 2

## 2018-05-23 MED ORDER — FENTANYL CITRATE (PF) 100 MCG/2ML IJ SOLN
INTRAMUSCULAR | Status: DC | PRN
  Administered 2018-05-23 (×3): 25 ug via INTRAVENOUS

## 2018-05-23 MED ORDER — PHENYLEPHRINE HCL 0.25 % NA SOLN: 1.0000 | Freq: Four times a day (QID) | NASAL | Status: DC | PRN

## 2018-05-23 MED ORDER — FENTANYL CITRATE (PF) 100 MCG/2ML IJ SOLN
INTRAMUSCULAR | Status: AC
  Filled 2018-05-23: qty 4

## 2018-05-23 MED ORDER — SODIUM CHLORIDE 0.9 % IV BOLUS
250.0000 mL | Freq: Once | INTRAVENOUS | Status: AC
  Administered 2018-05-23: 250 mL via INTRAVENOUS

## 2018-05-23 MED ORDER — LIDOCAINE HCL 2 % EX GEL: 1.0000 "application " | Freq: Once | CUTANEOUS | Status: DC

## 2018-05-23 NOTE — Progress Notes (Addendum)
[facility]    Date of Admission:  05/19/2018     ID: Roger Blackwell is a 83 y.o. male with necrotizing pneumonia Active Problems:   Cough with hemoptysis   Acute diastolic CHF (congestive heart failure) (HCC)   Gout   Weight loss   Hemoptysis   Malnutrition of moderate degree    Subjective: Remains afebrile, underwent bronchoscopy without difficulty Medications:  . allopurinol  900 mg Oral Daily  . feeding supplement (ENSURE ENLIVE)  237 mL Oral BID BM  . insulin aspart  0-9 Units Subcutaneous TID WC  . latanoprost  1 drop Both Eyes QHS  . lidocaine  1 application Topical Once    Objective: Vital signs in last 24 hours: Temp:  [98 F (36.7 C)-98.9 F (37.2 C)] 98.9 F (37.2 C) (01/07 1334) Pulse Rate:  [69-72] 71 (01/07 1334) Resp:  [7-32] 18 (01/07 1334) BP: (111-177)/(46-112) 166/76 (01/07 1334) SpO2:  [94 %-100 %] 95 % (01/07 1334)  Physical Exam  Constitutional: He is oriented to person, place, and time. He appears well-developed and well-nourished. No distress.  HENT:  Mouth/Throat: Oropharynx is clear and moist. No oropharyngeal exudate.  Cardiovascular: Normal rate, regular rhythm and normal heart sounds. Exam reveals no gallop and no friction rub.  No murmur heard.  Pulmonary/Chest: Effort normal and breath sounds normal. No respiratory distress. He has no wheezes.  Abdominal: Soft. Bowel sounds are normal. He exhibits no distension. There is no tenderness.  Lymphadenopathy:  He has no cervical adenopathy.  Neurological: He is alert and oriented to person, place, and time.  Skin: Skin is warm and dry. No rash noted. No erythema.  Psychiatric: He has a normal mood and affect. His behavior is normal.     Lab Results Recent Labs    05/21/18 0552 05/23/18 0642  WBC  --  9.8  HGB  --  9.2*  HCT  --  30.5*  NA  --  138  K  --  3.8  CL  --  106  CO2  --  25  BUN  --  19  CREATININE 1.19 1.07   Liver Panel No results  for input(s): PROT, ALBUMIN, AST, ALT, ALKPHOS, BILITOT, BILIDIR, IBILI in the last 72 hours. Sedimentation Rate No results for input(s): ESRSEDRATE in the last 72 hours. C-Reactive Protein No results for input(s): CRP in the last 72 hours.  Microbiology: reviewed Studies/Results: Dg Chest Port 1 View  Result Date: 05/23/2018 CLINICAL DATA:  Cough, congestion status post right lower lobe bronchoscopy, evaluate for pneumothorax EXAM: PORTABLE CHEST 1 VIEW COMPARISON:  CT chest dated 05/18/2018 FINDINGS: Masslike opacity in the right lower lung. Mild left basilar opacity. No pneumothorax is seen. The heart is normal in size. Surgical clips overlying the left lower hemithorax. IMPRESSION: No pneumothorax is seen. Masslike opacity in the right lower lung. Electronically Signed   By: Julian Hy M.D.   On: 05/23/2018 11:16   [place] Ekg Site Rite  Result Date: 05/22/2018 If Site Rite image not attached, placement could not be confirmed due to current cardiac rhythm.  Dg C-arm Bronchoscopy  Result Date: 05/23/2018 C-ARM BRONCHOSCOPY: Fluoroscopy was utilized by the requesting physician.  No radiographic interpretation.     Assessment/Plan: Necrotizing pneumonia = Plan on 4 wk of  Abtx. curently on IV therapy and thought that an empiric regimen of amox/clav 875mg  bid  Could work. Await the culture results from bronch to make final decision  and will see  back in the clinic in 2-4 wk to see how he is tolerating abtx and will follow up with serial cxr  Will follow up on bronch results  [facility] for Infectious Diseases Cell: [phone] Pager: [phone]  05/23/2018, 3:27 PM

## 2018-05-23 NOTE — Op Note (Signed)
[facility] Cardiopulmonary Patient Name: Roger Blackwell Procedure Date: 05/23/2018 MRN: [id number] Attending MD: Marshell Garfinkel , MD Date of Birth: 27-May-1935 CSN: [id number] Age: 83 Admit Type: Inpatient Ethnicity: Not Hispanic or Latino Procedure:            Bronchoscopy Indications:          Pneumonia, Abnormal CT scan of chest Providers:            Marshell Garfinkel, MD, Andre Lefort RRT,RCP, Christin Fudge Referring MD:          Medicines:            Midazolam 4 mg IV, Fentanyl 75 mcg IV Complications:        No immediate complications Estimated Blood Loss: Estimated blood loss: none. Procedure:      Pre-Anesthesia Assessment:      - A History and Physical has been performed. Patient meds and allergies       have been reviewed. The risks and benefits of the procedure and the       sedation options and risks were discussed with the patient. All       questions were answered and informed consent was obtained. Patient       identification and proposed procedure were verified prior to the       procedure by the physician in the procedure room. Mental Status       Examination: alert and oriented. Airway Examination: normal       oropharyngeal airway. Respiratory Examination: clear to auscultation. CV       Examination: RRR, no murmurs, no S3 or S4. ASA Grade Assessment: II - A       patient with mild systemic disease. After reviewing the risks and       benefits, the patient was deemed in satisfactory condition to undergo       the procedure. The anesthesia plan was to use moderate sedation /       analgesia (conscious sedation). Immediately prior to administration of       medications, the patient was re-assessed for adequacy to receive       sedatives. The heart rate, respiratory rate, oxygen saturations, blood       pressure, adequacy of pulmonary ventilation, and response to care were       monitored throughout the procedure. The  physical status of the patient       was re-assessed after the procedure.      After obtaining informed consent, the bronchoscope was passed under       direct vision. Throughout the procedure, the patient's blood pressure,       pulse, and oxygen saturations were monitored continuously. the BF-H190       ([id number]) Olympus Bronchoscope was introduced through the right nostril       and advanced to the tracheobronchial tree of both lungs. Findings:      Right Lung Abnormalities: Notable exudate was found in the right lower       lobe.      Bronchoalveolar lavage was performed in the RLL posterior basal segment       (B10) of the lung and sent for cell count, bacterial culture, viral       smears & culture, and fungal & AFB analysis and cytology. 180 mL of  fluid were instilled. 120 mL were returned. The return was blood-tinged.       There were no mucoid plugs in the return fluid. Multiple specimens were       obtained and pooled into one specimen, which was sent for analysis.      Fluoroscopy guided transbronchial brushings of an area of infiltration       were obtained in the lateral basal segment of the right lower lobe and       in the posterior basal segment of the right lower lobe with a cytology       brush. Four samples were obtained. Transbronchial brushing technique was       selected because the sampling site was not accessible using standard       endoscopic (bronchoscopic) techniques.      Transbronchial biopsies of an area of infiltration were performed in the       lateral basal segment of the right lower lobe and in the posterior basal       segment of the right lower lobe using alligator forceps and sent for       histopathology examination. The procedure was guided by fluoroscopy.       Transbronchial biopsy technique was selected because the sampling site       was not accessible using standard endoscopic (bronchoscopic) techniques.       Four biopsy passes were  performed. Four biopsy samples were obtained. Impression:      - Pneumonia      - Abnormal CT scan of chest      - Exudate was found in the right lower lobe.      - Bronchoalveolar lavage was performed.      - Fluoroscopically guided transbronchial brushings were obtained.      - Transbronchial lung biopsies were performed. Moderate Sedation:      Moderate (conscious) sedation was administered by the nurse and       supervised by the physician performing the procedure. The patient's       oxygen saturation, heart rate, blood pressure and response to care were       monitored. Total physician intraservice time was 30 minutes. Recommendation:      - Await BAL, biopsy and brushing results. Procedure Code(s):      --- Professional ---      [phone], Bronchoscopy, rigid or flexible, including fluoroscopic guidance,       when performed; with transbronchial lung biopsy(s), single lobe      60454, Bronchoscopy, rigid or flexible, including fluoroscopic guidance,       when performed; with bronchial alveolar lavage      [phone], Bronchoscopy, rigid or flexible, including fluoroscopic guidance,       when performed; with brushing or protected brushings      99152, Moderate sedation services provided by the same physician or       other qualified health care professional performing the diagnostic or       therapeutic service that the sedation supports, requiring the presence       of an independent trained observer to assist in the monitoring of the       patient's level of consciousness and physiological status; initial 15       minutes of intraservice time, patient age 30 years or older      [id number], Moderate sedation; each additional 15 minutes intraservice time Diagnosis Code(s):      --- Professional ---  J18.9, Pneumonia, unspecified organism      R93.89, Abnormal findings on diagnostic imaging of other specified body       structures CPT copyright 2018 [employer]. All  rights reserved. The codes documented in this report are preliminary and upon coder review may  be revised to meet current compliance requirements. Marshell Garfinkel, MD 05/23/2018 11:02:55 AM Number of Addenda: 0 Scope In: 10:25:23 AM Scope Out: 10:46:52 AM

## 2018-05-23 NOTE — Progress Notes (Signed)
PROGRESS NOTE    Roger Blackwell  HMC:[id number] DOB: Jun 10, 1935 DOA: 05/19/2018 PCP: [name], MD  Brief Narrative:83 y.o.malewith medical history significant ofchronic diastolicheart failure, type 2 diabetes, hypertension, CKD stage III, CAD with a stent in place 2002, morbid obesity, gout, hyperlipidemia admitted with complaints of hemoptysis for the last 4 days. Prior to that he was in his usual state of health. He does not know if he had a fever at home he did not check or feel feverish or had Reiger's or chills. He went to his PCP yesterday with the same complaint and they gave him Zithromax and had a chest CT done and the results came out today showing that he has right-sided consolidation with gas gas and pleural effusion. PCP sent him to the ER. He is says ex-smoker he has a on and off cough but this is a first time he has been having hemoptysis. He does complain of shortness of breath outside of his congestive heart failure. His appetite is diminished he has lost over 50 pounds in the last 1 year unintentionally. He had nausea and vomiting x1 today has occasional diarrhea. Denies abdominal pain chest pain. Denies headache changes with his vision. Denies any urinary complaints. Patient lives at home with his wife. He has not had any recent sick contacts, recent antibiotics, recent travel, or exposure to TB that he knows of. Lower extremity swelling which has not changed. He denies any use of alcohol. 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 His vital signs in the ER Pressure 1 4579 pulse is 71 respiration 19 temperature 98.3 pulse ox 99% on room air. Labs show sodium 140 potassium 4.1 BUN 16 creatinine 1.21 white count 10 hemoglobin 9.4 platelet count 221. CT of the chest shows extensive consolidation and airspace disease in the right lower lobe, small pockets of gas along the periphery of the right lung  consolidation concerning for lung necrosis. Right pleural effusion that contains gas, question severe infectious or inflammatory process versus neoplasm. Nodular lesion seen in both lungs small amount of gas in the right pleural space raises concern for bronchopleural fistula. Scattered pleural calcification with plaques findings: Compatible with prior asbestos exposure 0.9 cm nodule in the left upper lobe and poorly defined pleural-based nodular lesion or mass in the left upper lobe. Review of Systems:Difficult for weight loss and decreased appetite  ambulatory Status:He is ambulatory at baseline he lives at home with his wife    Assessment & Plan:   Active Problems:   Cough with hemoptysis   Acute diastolic CHF (congestive heart failure) (HCC)   Gout   Weight loss   Hemoptysis   Malnutrition of moderate degree  #1 acute necrotizing right lower lobe pneumonia patient presented with hemoptysis continues to have hemoptysis and shortness of breath and cough. We will continue empiric broad-spectrum IV antibiotics. Appreciate pccm and id consult.id recommends picc line for iv antibiotics.rocephin and flagyl .For bronch tomorrow.need to rule out malignancy.  Gone for bronchoscopy this morning.  #2 chronic congestive heart failure diastolic stable  #3 stage III CKD stable  #4 type 2 diabetes with CKD continue SSI    Nutrition Problem: Moderate Malnutrition Etiology: chronic illness(CHF)     Signs/Symptoms: percent weight loss, mild fat depletion, moderate muscle depletion Percent weight loss: 22 %(x 9.5 months)    Interventions: Ensure Enlive (each supplement provides 350kcal and 20 grams of protein)  Estimated body mass index is 30.74 kg/m as calculated  from the following:   Height as of this encounter: 6\' 1"  (1.854 m).   Weight as of this encounter: 105.7 kg.  DVT prophylaxis: SCD Code Status: Full code Family Communication: Wife not in the room Disposition  Plan: Pending further work-up and clinical improvement Consultants:   PCCM and infectious disease  Procedures: None Antimicrobials:  Rocephin Flagyl  Subjective: Feels okay still continues to have cough with blood sputum  Objective: Vitals:   05/23/18 0556 05/23/18 0948 05/23/18 0950 05/23/18 0955  BP: 133/72  (!) 132/59 (!) 137/56  Pulse: 69     Resp: 20  (!) 21 (!) 21  Temp: 98 F (36.7 C) 98.5 F (36.9 C)    TempSrc: Oral Oral    SpO2: 95%     Weight:      Height:        Intake/Output Summary (Last 24 hours) at 05/23/2018 1015 Last data filed at 05/23/2018 0600 Gross per 24 hour  Intake 628.59 ml  Output -  Net 628.59 ml   Filed Weights   05/19/18 2214  Weight: 105.7 kg    Examination:  General exam: Appears calm and comfortable  Respiratory system: Coarse breath sounds to the right to auscultation. Respiratory effort normal. Cardiovascular system: S1 & S2 heard, RRR. No JVD, murmurs, rubs, gallops or clicks. No pedal edema. Gastrointestinal system: Abdomen is nondistended, soft and nontender. No organomegaly or masses felt. Normal bowel sounds heard. Central nervous system: Alert and oriented. No focal neurological deficits. Extremities: 1+ pitting edema Skin: No rashes, lesions or ulcers Psychiatry: Judgement and insight appear normal. Mood & affect appropriate.     Data Reviewed: I have personally reviewed following labs and imaging studies  CBC: Recent Labs  Lab 05/19/18 1406 05/23/18 0642  WBC 10.2 9.8  HGB 9.4* 9.2*  HCT 31.9* 30.5*  MCV 87.9 86.2  PLT 221 270   Basic Metabolic Panel: Recent Labs  Lab 05/19/18 1406 05/21/18 0552 05/23/18 0642  NA 140  --  138  K 4.1  --  3.8  CL 106  --  106  CO2 25  --  25  GLUCOSE 125*  --  112*  BUN 16  --  19  CREATININE 1.21 1.19 1.07  CALCIUM 9.1  --  8.8*   GFR: Estimated Creatinine Clearance: 70.2 mL/min (by C-G formula based on SCr of 1.07 mg/dL). Liver Function Tests: No results for  input(s): AST, ALT, ALKPHOS, BILITOT, PROT, ALBUMIN in the last 168 hours. No results for input(s): LIPASE, AMYLASE in the last 168 hours. No results for input(s): AMMONIA in the last 168 hours. Coagulation Profile: No results for input(s): INR, PROTIME in the last 168 hours. Cardiac Enzymes: No results for input(s): CKTOTAL, CKMB, CKMBINDEX, TROPONINI in the last 168 hours. BNP (last 3 results) No results for input(s): PROBNP in the last 8760 hours. HbA1C: No results for input(s): HGBA1C in the last 72 hours. CBG: Recent Labs  Lab 05/22/18 0732 05/22/18 1152 05/22/18 1631 05/22/18 2118 05/23/18 0759  GLUCAP 103* 135* 137* 110* 103*   Lipid Profile: No results for input(s): CHOL, HDL, LDLCALC, TRIG, CHOLHDL, LDLDIRECT in the last 72 hours. Thyroid Function Tests: No results for input(s): TSH, T4TOTAL, FREET4, T3FREE, THYROIDAB in the last 72 hours. Anemia Panel: No results for input(s): VITAMINB12, FOLATE, FERRITIN, TIBC, IRON, RETICCTPCT in the last 72 hours. Sepsis Labs: No results for input(s): PROCALCITON, LATICACIDVEN in the last 168 hours.  Recent Results (from the past 240 hour(s))  Culture, blood (  routine x 2)     Status: None (Preliminary result)   Collection Time: 05/19/18  7:17 PM  Result Value Ref Range Status   Specimen Description   Final    BLOOD BLOOD RIGHT ARM Performed at [facility] [address], [place], [place] [address]    Special Requests   Final    BOTTLES DRAWN AEROBIC ONLY Blood Culture adequate volume Performed at [facility] [address], [place], [place] [address]    Culture   Final    NO GROWTH 4 DAYS Performed at [facility], [place] [address], [place], [place] [address]    Report Status PENDING  Incomplete  Culture, blood (routine x 2)     Status: None (Preliminary result)   Collection Time: 05/19/18  7:18 PM  Result Value Ref Range Status   Specimen Description   Final    BLOOD RIGHT  ANTECUBITAL Performed at [facility] [address] [place], [place] [address]    Special Requests   Final    BOTTLES DRAWN AEROBIC ONLY Blood Culture adequate volume Performed at [facility] [address] [place], [place] [address]    Culture   Final    NO GROWTH 4 DAYS Performed at [facility], [place] [address], [place], [place] [address]    Report Status PENDING  Incomplete  MRSA PCR Screening     Status: None   Collection Time: 05/20/18  5:51 PM  Result Value Ref Range Status   MRSA by PCR NEGATIVE NEGATIVE Final    Comment:        The GeneXpert MRSA Assay (FDA approved for NASAL specimens only), is one component of a comprehensive MRSA colonization surveillance program. It is not intended to diagnose MRSA infection nor to guide or monitor treatment for MRSA infections. Performed at [facility], [place] [address] [place], [place] [address]   Expectorated sputum assessment w rflx to resp cult     Status: None   Collection Time: 05/22/18  9:24 AM  Result Value Ref Range Status   Specimen Description SPUTUM  Final   Special Requests NONE  Final   Sputum evaluation   Final    THIS SPECIMEN IS ACCEPTABLE FOR SPUTUM CULTURE Performed at [facility], [place] [address], [place], [place] [address]    Report Status 05/22/2018 FINAL  Final  Culture, respiratory     Status: None (Preliminary result)   Collection Time: 05/22/18  9:24 AM  Result Value Ref Range Status   Specimen Description   Final    SPUTUM Performed at [facility] [address], [place], [place] [address]    Special Requests   Final    NONE Reflexed from [id number] Performed at [facility] [address] [place], [place] [address]    Gram Stain   Final    NO WBC SEEN RARE SQUAMOUS EPITHELIAL CELLS PRESENT RARE GRAM POSITIVE COCCI    Culture   Final    CULTURE REINCUBATED FOR BETTER  GROWTH Performed at [facility], [place] [address], [place],  [address]    Report Status PENDING  Incomplete         Radiology Studies: [place] Ekg Site Rite  Result Date: 05/22/2018 If Site Rite image not attached, placement could not be confirmed due to current cardiac rhythm.       Scheduled Meds: . [MAR Hold] allopurinol  900 mg Oral Daily  . [MAR Hold] feeding supplement (ENSURE ENLIVE)  237 mL Oral BID BM  . [MAR Hold] insulin aspart  0-9 Units Subcutaneous TID WC  . [MAR Hold] latanoprost  1 drop Both Eyes QHS   Continuous Infusions: . sodium chloride 10 mL/hr at 05/23/18 1005  . [MAR Hold] cefTRIAXone (ROCEPHIN)  IV 2 g (05/22/18 1808)  . [MAR Hold] metronidazole 500 mg (05/23/18 0554)     LOS: 3 days     Georgette Shell, MD [facility]  If 7PM-7AM, please contact night-coverage [email or web address] Password [email or web address] 05/23/2018, 10:15 AM

## 2018-05-23 NOTE — Progress Notes (Signed)
Pt. Wife made this RN aware that the pt. Had a vomiting episode. Vomit was clear with some blood, consistent with sputum during this admission. On call NP Schorr paged and made aware. Will continue to monitor pt. Closely.

## 2018-05-23 NOTE — Progress Notes (Signed)
Video Bronchoscopy done Intervention Bronchial washing done Intervention Bronchial biopsy Intervention Bronchial brushing done Procedure tolerated well

## 2018-05-24 LAB — ACID FAST SMEAR (AFB, MYCOBACTERIA): Acid Fast Smear: NEGATIVE

## 2018-05-24 LAB — CULTURE, RESPIRATORY W GRAM STAIN
Culture: NORMAL
Gram Stain: NONE SEEN

## 2018-05-24 LAB — CULTURE, BLOOD (ROUTINE X 2)
CULTURE: NO GROWTH
Culture: NO GROWTH
Special Requests: ADEQUATE
Special Requests: ADEQUATE

## 2018-05-24 LAB — GLUCOSE, CAPILLARY: Glucose-Capillary: 112 mg/dL — ABNORMAL HIGH (ref 70–99)

## 2018-05-24 NOTE — Progress Notes (Signed)
PROGRESS NOTE    Roger Blackwell  JSE:[id number] DOB: 1935/06/18 DOA: 05/19/2018 PCP: [name], MD   Brief Narrative: 83 y.o.malewith medical history significant ofchronic diastolicheart failure, type 2 diabetes, hypertension, CKD stage III, CAD with a stent in place 2002, morbid obesity, gout, hyperlipidemia admitted with complaints of hemoptysis for the last 4 days. Prior to that he was in his usual state of health. He does not know if he had a fever at home he did not check or feel feverish or had Reiger's or chills. He went to his PCP yesterday with the same complaint and they gave him Zithromax and had a chest CT done and the results came out today showing that he has right-sided consolidation with gas gas and pleural effusion. PCP sent him to the ER. He is says ex-smoker he has a on and off cough but this is a first time he has been having hemoptysis. He does complain of shortness of breath outside of his congestive heart failure. His appetite is diminished he has lost over 50 pounds in the last 1 year unintentionally. He had nausea and vomiting x1 today has occasional diarrhea. Denies abdominal pain chest pain. Denies headache changes with his vision. Denies any urinary complaints. Patient lives at home with his wife. He has not had any recent sick contacts, recent antibiotics, recent travel, or exposure to TB that he knows of. Lower extremity swelling which has not changed. He denies any use of alcohol. 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 His vital signs in the ER Pressure 1 4579 pulse is 71 respiration 19 temperature 98.3 pulse ox 99% on room air. Labs show sodium 140 potassium 4.1 BUN 16 creatinine 1.21 white count 10 hemoglobin 9.4 platelet count 221. CT of the chest shows extensive consolidation and airspace disease in the right lower lobe, small pockets of gas along the periphery of the right lung  consolidation concerning for lung necrosis. Right pleural effusion that contains gas, question severe infectious or inflammatory process versus neoplasm. Nodular lesion seen in both lungs small amount of gas in the right pleural space raises concern for bronchopleural fistula. Scattered pleural calcification with plaques findings: Compatible with prior asbestos exposure 0.9 cm nodule in the left upper lobe and poorly defined pleural-based nodular lesion or mass in the left upper lobe.  Assessment & Plan:   Active Problems:   Cough with hemoptysis   Acute diastolic CHF (congestive heart failure) (HCC)   Gout   Weight loss   Hemoptysis   Malnutrition of moderate degree  #1 acute necrotizing right lower lobe pneumonia patient presented with hemoptysis continues to have hemoptysis and shortness of breath and cough. We will continue empiric broad-spectrum IV antibiotics. Appreciate pccm and id consult. Patient had bronc 05/23/2018.  Patient has follow-up appointment with Dr. [name].  Cultures are pending cytology is pending.  Continue IV antibiotics.   #2 chronic congestive heart failure diastolic stable  #3 stage III CKD stable  #4 type 2 diabetes with CKD continue SSI  #5 patient has history of BPH followed at [facility] urology Dr. Jeffie Pollock informed me that patient had a urine culture 05/16/2019 which grew E. coli sensitive to Rocephin.    #6 E. coli UTI patient had urine culture done at [facility] urology on May 10, 2018 which came back as E. coli sensitive to Rocephin Bactrim fluoroquinolones second and third generation cephalosporins.   Nutrition Problem: Moderate Malnutrition Etiology: chronic illness(CHF)  Signs/Symptoms: percent weight loss, mild fat depletion, moderate muscle depletion Percent weight loss: 22 %(x 9.5 months)    Interventions: Ensure Enlive (each supplement provides 350kcal and 20 grams of protein)  Estimated body mass index is 30.74 kg/m as  calculated from the following:   Height as of this encounter: _0  (1.854 m).   Weight as of this encounter: 105.7 kg.  DVT prophylaxis: Lovenox Code Status full code Family Communication: Discussed with patient's wife Disposition Plan pending clinical improvement and ID clearance Consultants: Pulmonary and infectious disease  Procedures: Bronc 05/23/2018 Antimicrobials: Rocephin and Flagyl Subjective: Complains of ongoing hemoptysis Objective: Resting in bed in no acute distress Vitals:   05/23/18 1924 05/23/18 2121 05/23/18 2231 05/24/18 0520  BP:  (!) 123/58  130/61  Pulse:  81  64  Resp:  18  18  Temp: (!) 101.1 F (38.4 C) (!) 101.1 F (38.4 C) (!) 100.9 F (38.3 C) 98.3 F (36.8 C)  TempSrc: Oral Oral Oral Oral  SpO2:  95%  96%  Weight:      Height:        Intake/Output Summary (Last 24 hours) at 05/24/2018 1312 Last data filed at 05/24/2018 0800 Gross per 24 hour  Intake 855.83 ml  Output -  Net 855.83 ml   Filed Weights   05/19/18 2214  Weight: 105.7 kg    Examination:  General exam: Appears calm and comfortable  Respiratory system: Clear to auscultation. Respiratory effort normal. Cardiovascular system: S1 & S2 heard, RRR. No JVD, murmurs, rubs, gallops or clicks. No pedal edema. Gastrointestinal system: Abdomen is nondistended, soft and nontender. No organomegaly or masses felt. Normal bowel sounds heard. Central nervous system: Alert and oriented. No focal neurological deficits. Extremities: Symmetric 5 x 5 power. Skin: No rashes, lesions or ulcers Psychiatry: Judgement and insight appear normal. Mood & affect appropriate.     Data Reviewed: I have personally reviewed following labs and imaging studies  CBC: Recent Labs  Lab 05/19/18 1406 05/23/18 0642  WBC 10.2 9.8  HGB 9.4* 9.2*  HCT 31.9* 30.5*  MCV 87.9 86.2  PLT 221 500   Basic Metabolic Panel: Recent Labs  Lab 05/19/18 1406 05/21/18 0552 05/23/18 0642  NA 140  --  138  K 4.1  --   3.8  CL 106  --  106  CO2 25  --  25  GLUCOSE 125*  --  112*  BUN 16  --  19  CREATININE 1.21 1.19 1.07  CALCIUM 9.1  --  8.8*   GFR: Estimated Creatinine Clearance: 70.2 mL/min (by C-G formula based on SCr of 1.07 mg/dL). Liver Function Tests: No results for input(s): AST, ALT, ALKPHOS, BILITOT, PROT, ALBUMIN in the last 168 hours. No results for input(s): LIPASE, AMYLASE in the last 168 hours. No results for input(s): AMMONIA in the last 168 hours. Coagulation Profile: No results for input(s): INR, PROTIME in the last 168 hours. Cardiac Enzymes: No results for input(s): CKTOTAL, CKMB, CKMBINDEX, TROPONINI in the last 168 hours. BNP (last 3 results) No results for input(s): PROBNP in the last 8760 hours. HbA1C: No results for input(s): HGBA1C in the last 72 hours. CBG: Recent Labs  Lab 05/22/18 2118 05/23/18 0759 05/23/18 1217 05/23/18 1644 05/23/18 2120  GLUCAP 110* 103* 112* 167* 153*   Lipid Profile: No results for input(s): CHOL, HDL, LDLCALC, TRIG, CHOLHDL, LDLDIRECT in the last 72 hours. Thyroid Function Tests: No results for input(s): TSH, T4TOTAL, FREET4, T3FREE, THYROIDAB in the last 72  hours. Anemia Panel: No results for input(s): VITAMINB12, FOLATE, FERRITIN, TIBC, IRON, RETICCTPCT in the last 72 hours. Sepsis Labs: No results for input(s): PROCALCITON, LATICACIDVEN in the last 168 hours.  Recent Results (from the past 240 hour(s))  Culture, blood (routine x 2)     Status: None   Collection Time: 05/19/18  7:17 PM  Result Value Ref Range Status   Specimen Description   Final    BLOOD BLOOD RIGHT ARM Performed at [facility] [address] [place], [place] [address]    Special Requests   Final    BOTTLES DRAWN AEROBIC ONLY Blood Culture adequate volume Performed at [facility] [address] [place], [place] [address]    Culture   Final    NO GROWTH 5 DAYS Performed at [facility], [place] [address] [place], [place] [address]    Report Status 05/24/2018 FINAL  Final  Culture, blood (routine x 2)     Status: None   Collection Time: 05/19/18  7:18 PM  Result Value Ref Range Status   Specimen Description   Final    BLOOD RIGHT ANTECUBITAL Performed at [facility] [address], [place], [place] [address]    Special Requests   Final    BOTTLES DRAWN AEROBIC ONLY Blood Culture adequate volume Performed at [facility] [address] [place], [place] [address]    Culture   Final    NO GROWTH 5 DAYS Performed at [facility], [place] [address] [place], [place] [address]    Report Status 05/24/2018 FINAL  Final  MRSA PCR Screening     Status: None   Collection Time: 05/20/18  5:51 PM  Result Value Ref Range Status   MRSA by PCR NEGATIVE NEGATIVE Final    Comment:        The GeneXpert MRSA Assay (FDA approved for NASAL specimens only), is one component of a comprehensive MRSA colonization surveillance program. It is not intended to diagnose MRSA infection nor to guide or monitor treatment for MRSA infections. Performed at [facility], [place] [address], [place], [place] [address]   Expectorated sputum assessment w rflx to resp cult     Status: None   Collection Time: 05/22/18  9:24 AM  Result Value Ref Range Status   Specimen Description SPUTUM  Final   Special Requests NONE  Final   Sputum evaluation   Final    THIS SPECIMEN IS ACCEPTABLE FOR SPUTUM CULTURE Performed at [facility], [place] [address] [place], [place] [address]    Report Status 05/22/2018 FINAL  Final  Culture, respiratory     Status: None   Collection Time: 05/22/18  9:24 AM  Result Value Ref Range Status   Specimen Description   Final    SPUTUM Performed at [facility] [address] [place], [place] [address]    Special Requests   Final    NONE Reflexed from [id number] Performed at [facility] [address], [place], [place] [address]    Gram Stain   Final    NO WBC SEEN RARE SQUAMOUS EPITHELIAL CELLS PRESENT RARE GRAM POSITIVE COCCI    Culture   Final    Consistent with normal respiratory flora. Performed at [facility], [place] [address] [place], [place] [address]    Report Status 05/24/2018 FINAL  Final  Culture, bal-quantitative  Status: None (Preliminary result)   Collection Time: 05/23/18 10:51 AM  Result Value Ref Range Status   Specimen Description   Final    BRONCHIAL ALVEOLAR LAVAGE Performed at [facility] [address] [place], [place] [address]    Special Requests   Final    Normal Performed at [facility], [place] [address] [place], [place] [address]    Gram Stain   Final    MODERATE WBC PRESENT, PREDOMINANTLY PMN NO ORGANISMS SEEN    Culture   Final    CULTURE REINCUBATED FOR BETTER GROWTH Performed at [facility], [place] [address], [place], [place] [address]    Report Status PENDING  Incomplete  Culture, blood (routine x 2)     Status: None (Preliminary result)   Collection Time: 05/23/18  8:55 PM  Result Value Ref Range Status   Specimen Description   Final    BLOOD LEFT FOREARM Performed at [facility], [place] [address] [place], [place] [address]    Special Requests   Final    BOTTLES DRAWN AEROBIC AND ANAEROBIC Blood Culture adequate volume Performed at [facility] [address], [place], [place] [address]    Culture PENDING  Incomplete   Report Status PENDING  Incomplete         Radiology Studies: Dg Chest Port 1 View  Result Date: 05/23/2018 CLINICAL DATA:  Cough, congestion status post right lower lobe bronchoscopy, evaluate for pneumothorax EXAM: PORTABLE CHEST 1 VIEW COMPARISON:  CT chest dated 05/18/2018 FINDINGS: Masslike opacity in the right lower lung. Mild left basilar opacity. No pneumothorax is seen. The heart is normal  in size. Surgical clips overlying the left lower hemithorax. IMPRESSION: No pneumothorax is seen. Masslike opacity in the right lower lung. Electronically Signed   By: Julian Hy M.D.   On: 05/23/2018 11:16   Dg C-arm Bronchoscopy  Result Date: 05/23/2018 C-ARM BRONCHOSCOPY: Fluoroscopy was utilized by the requesting physician.  No radiographic interpretation.        Scheduled Meds: . allopurinol  900 mg Oral Daily  . feeding supplement (ENSURE ENLIVE)  237 mL Oral BID BM  . insulin aspart  0-9 Units Subcutaneous TID WC  . latanoprost  1 drop Both Eyes QHS  . lidocaine  1 application Topical Once   Continuous Infusions: . sodium chloride 10 mL/hr at 05/23/18 1005  . cefTRIAXone (ROCEPHIN)  IV 2 g (05/23/18 1723)  . metronidazole 500 mg (05/24/18 0547)     LOS: 4 days    Georgette Shell, MD [facility]  If 7PM-7AM, please contact night-coverage [email or web address] Password [email or web address] 05/24/2018, 1:12 PM

## 2018-05-24 NOTE — Progress Notes (Signed)
    [facility]    Date of Admission:  05/19/2018   Total days of antibiotics 6           ID: Roger Blackwell is a 83 y.o. male with  Necrotizing pneumonia Active Problems:   Cough with hemoptysis   Acute diastolic CHF (congestive heart failure) (HCC)   Gout   Weight loss   Hemoptysis   Malnutrition of moderate degree    Subjective: Had fever of 101F last night but now feeling better. He states he slept well. Still having productive cough with blood tinged sputum  ROS: no chills, nightsweats, abdominal pain, diarrhea or n/v  Medications:  . allopurinol  900 mg Oral Daily  . feeding supplement (ENSURE ENLIVE)  237 mL Oral BID BM  . insulin aspart  0-9 Units Subcutaneous TID WC  . latanoprost  1 drop Both Eyes QHS  . lidocaine  1 application Topical Once    Objective: Vital signs in last 24 hours: Temp:  [98.3 F (36.8 C)-101.1 F (38.4 C)] 98.3 F (36.8 C) (01/08 0520) Pulse Rate:  [64-94] 64 (01/08 0520) Resp:  [18] 18 (01/08 0520) BP: (123-168)/(58-61) 130/61 (01/08 0520) SpO2:  [95 %-96 %] 96 % (01/08 0520)  Physical Exam  Constitutional: He is oriented to person, place, and time. He appears well-developed and well-nourished. No distress.  HENT:  Mouth/Throat: Oropharynx is clear and moist. No oropharyngeal exudate.  Cardiovascular: Normal rate, regular rhythm and normal heart sounds. Exam reveals no gallop and no friction rub.  No murmur heard.  Pulmonary/Chest: Effort normal and breath sounds normal. No respiratory distress. Decreased BS at right base Abdominal: Soft. Bowel sounds are normal. He exhibits no distension. There is no tenderness.  Lymphadenopathy:  He has no cervical adenopathy.  Neurological: He is alert and oriented to person, place, and time.  Skin: Skin is warm and dry. No rash noted. No erythema.  Psychiatric: He has a normal mood and affect. His behavior is normal.    Lab Results Recent Labs    05/23/18 0642    WBC 9.8  HGB 9.2*  HCT 30.5*  NA 138  K 3.8  CL 106  CO2 25  BUN 19  CREATININE 1.07   No results found for: Micheline Rough  Microbiology: 1/7 BAL pending Studies/Results: Dg Chest Port 1 View  Result Date: 05/23/2018 CLINICAL DATA:  Cough, congestion status post right lower lobe bronchoscopy, evaluate for pneumothorax EXAM: PORTABLE CHEST 1 VIEW COMPARISON:  CT chest dated 05/18/2018 FINDINGS: Masslike opacity in the right lower lung. Mild left basilar opacity. No pneumothorax is seen. The heart is normal in size. Surgical clips overlying the left lower hemithorax. IMPRESSION: No pneumothorax is seen. Masslike opacity in the right lower lung. Electronically Signed   By: Julian Hy M.D.   On: 05/23/2018 11:16   Dg C-arm Bronchoscopy  Result Date: 05/23/2018 C-ARM BRONCHOSCOPY: Fluoroscopy was utilized by the requesting physician.  No radiographic interpretation.     Assessment/Plan: Necrotizing pneumonia = recommend to continue on ceftriaxone plus metronidazole while hospitalized. Await BAL culture to narrow abtx and make final abtx recs  Fever = likely post procedure, will continue to monitor  [facility] for Infectious Diseases Cell: [phone] Pager: [phone]  05/24/2018, 4:02 PM

## 2018-05-24 NOTE — Care Management Important Message (Signed)
Important Message  Patient Details  Name: Roger Blackwell MRN: [id number] Date of Birth: [birth year]   Medicare Important Message Given:  Yes    Kerin Salen 05/24/2018, 11:45 AMImportant Message  Patient Details  Name: Roger Blackwell MRN: [id number] Date of Birth: 08-06-1935   Medicare Important Message Given:  Yes    Kerin Salen 05/24/2018, 11:45 AM

## 2018-05-24 NOTE — Progress Notes (Signed)
Chart reviewed, PCCM will follow up once cytology, culture results returned.    Noe Gens, NP-C  Pulmonary & Critical Care Pgr: [phone] or if no answer [phone] 05/24/2018, 9:18 AM

## 2018-05-25 ENCOUNTER — Encounter (HOSPITAL_COMMUNITY): Payer: Self-pay | Admitting: Pulmonary Disease

## 2018-05-25 DIAGNOSIS — Z9889 Other specified postprocedural states: Secondary | ICD-10-CM

## 2018-05-25 LAB — CULTURE, BAL-QUANTITATIVE W GRAM STAIN
Culture: 20000 — AB
Special Requests: NORMAL

## 2018-05-25 LAB — GLUCOSE, CAPILLARY
Glucose-Capillary: 108 mg/dL — ABNORMAL HIGH (ref 70–99)
Glucose-Capillary: 166 mg/dL — ABNORMAL HIGH (ref 70–99)

## 2018-05-25 LAB — CULTURE, BAL-QUANTITATIVE

## 2018-05-25 MED ORDER — AMOXICILLIN-POT CLAVULANATE 875-125 MG PO TABS: 1.0000 | ORAL_TABLET | Freq: Two times a day (BID) | ORAL | 0 refills | Status: AC

## 2018-05-25 MED ORDER — ALLOPURINOL 300 MG PO TABS: 300.0000 mg | ORAL_TABLET | Freq: Every day | ORAL | Status: DC

## 2018-05-25 NOTE — Consult Note (Signed)
NAME:  Roger Blackwell, MRN:  [id number], DOB:  05-21-1935, LOS: 5 ADMISSION DATE:  05/19/2018, CONSULTATION DATE:  05/22/18 REFERRING MD:  Dr. Zigmund Daniel, CHIEF COMPLAINT:  Hemoptysis  Brief History   [name] admitted 1/3 with 4 day history of hemoptysis.  CXR at PCP office 1/2 was concerning for PNA and he was given zithromax.  CT of the chest was ordered which showed right sided consolidation with gas and associated pleural effusion. The patient was admitted for further evaluation.  Seen by [facility] for surgical evaluation and felt not to be a surgical candidate.  PCCM consulted  Past Medical History  Left empyema s/p thoracotomy ~ 2000 Prostate CA - on lupron Former Smoker - began age 70, quit 1994 (15 years, 1ppd at heaviest) DM  CHF  CAD  Morbid Obesity  HTN  Significant Hospital Events   1/03  Admit with hemoptysis, necrotizing PNA 1/04  Seen by [facility], felt not to be a surgical candidate  1/06  PCCM consulted  1/07 Bronchoscopy  Consults:  PCCM 1/6  Procedures:  1/07 Bronchoscopy-  Cultures negative, cytology, brushing, transbronchial biopsy-benign  Significant Diagnostic Tests:  CT Chest 1/2 >> extensive RLL consolidation, small pockets of gas worrisome for lung necrosis, small complex right pleural effusion that contains gas -raises concern for bronchopleural fistula, nodular lesions in both lungs, scattered pleural calcifications with plaques compatible with prior asbestos exposure, ill defined pleural based mass in LUL  Micro Data:  BCx2 1/3 >>  Sputum 1/3 >>  BAL 1/7-normal respiratory flora  Antimicrobials:  Cefepime 1/5 >>  Flagyl 1/5 >>   Interim history/subjective:  Feels better.  States that dyspnea, cough is improved. Has occasional mild blood-tinged sputum.  No frank hemoptysis.  Objective   Blood pressure 128/62, pulse 69, temperature 98.2 F (36.8 C), temperature source Oral, resp. rate 14, height _0  (1.854 m), weight 105.7 kg, SpO2 100 %.         Intake/Output Summary (Last 24 hours) at 05/25/2018 1214 Last data filed at 05/25/2018 0856 Gross per 24 hour  Intake 770 ml  Output -  Net 770 ml   Filed Weights   05/19/18 2214  Weight: 105.7 kg    Examination: Gen:      No acute distress HEENT:  EOMI, sclera anicteric Neck:     No masses; no thyromegaly Lungs:    Clear to auscultation bilaterally; normal respiratory effort CV:         Regular rate and rhythm; no murmurs Abd:      + bowel sounds; soft, non-tender; no palpable masses, no distension Ext:    No edema; adequate peripheral perfusion Skin:      Warm and dry; no rash Neuro: alert and oriented x 3 Psych: normal mood and affect  Resolved Hospital Problem list      Assessment & Plan:   [name], former smoker, admitted with concern for necrotizing PNA.  Additionally, found to have left pleural based mass, pleural thickening and pleural plaques.  He has had 40-50lb weight loss, decreased appetite, occasional night sweats.  He has had pleural thickening dating back to 2014.  Concern for possible PNA but constellation of symptoms also worrisome for malignancy.  Must also consider aspiration.      Necrotizing PNA with Hemoptysis -R dense consolidation with pleural effusion with gas P: Bronch results reviewed with normal respiratory flora, malignancy Agree with 4 weeks of Augmentin Follow-up with Dr. [name] on 2/3 with repeat imaging to make  sure the RLL opacity is clearing up. If opacity is persistent then can consider PET scan.  LUL Pleural Based Mass  P: Follow up as outpatient  Pleural Plaques -no hx of known asbestos exposure  P: Follow up CT imaging  Hx of Empyema -around 2000, s/p thoracotomy by Dr. Arlyce Dice   PCCM will be available as needed. Please call with questions  Marshell Garfinkel MD [facility] 05/25/2018, 12:22 PM

## 2018-05-25 NOTE — Discharge Summary (Signed)
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 05/25/2018, 11:30 AM Pager   If 7PM-7AM, please contact night-coverage [email or web address] Password [email or web address]

## 2018-05-25 NOTE — Progress Notes (Signed)
ID PROGRESS NOTE  BAL cx showing normal oral mouth flora. No identification of PsA or Staph aureus  Will plan to treat with amox/clav 875 bid x 4 wk Plan to see back in ID clinic in 3-4 wk  [facility] [phone]

## 2018-05-25 NOTE — Care Management Note (Signed)
Case Management Note  Patient Details  Name: Alam Guterrez MRN: [id number] Date of Birth: [birth year]  Subjective/Objective: Cough w/hemoptysis. From home. No CM needs.                 Action/Plan:d/c home.   Expected Discharge Date:  05/25/18               Expected Discharge Plan:  Home/Self Care  In-House Referral:     Discharge planning Services  CM Consult  Post Acute Care Choice:    Choice offered to:     DME Arranged:    DME Agency:     HH Arranged:    HH Agency:     Status of Service:  Completed, signed off  If discussed at [employer] of Stay Meetings, dates discussed:    Additional Comments:  Dessa Phi, RN 05/25/2018, 12:40 PM

## 2018-05-25 NOTE — Progress Notes (Signed)
Nutrition Follow-up  DOCUMENTATION CODES:   Non-severe (moderate) malnutrition in context of chronic illness, Obesity unspecified  INTERVENTION:  - Continue Ensure Enlive BID. - Continue to encourage PO intakes.   NUTRITION DIAGNOSIS:   Moderate Malnutrition related to chronic illness(CHF) as evidenced by percent weight loss, mild fat depletion, moderate muscle depletion. -ongoing  GOAL:   Patient will meet greater than or equal to 90% of their needs -minimally met on average.   MONITOR:   PO intake, Supplement acceptance, Labs, Weight trends, I & O's  ASSESSMENT:   83 y.o. male with medical history significant of chronic diastolic heart failure, type 2 diabetes, hypertension, CKD stage III, CAD with a stent in place 2002, morbid obesity, gout, hyperlipidemia admitted with complaints of hemoptysis for the last 4 days.  No new weight since admission on 1/3. Patient continues to mainly eat 100% of meals on average. He has accepted 7 of 10 bottles of Ensure since order was placed.   Per Dr. Melida Gimenez note 1/8 afternoon: acute necrotizing RLL PNA with ongoing hemoptysis, SOB, and cough; CHF--stable; CKD stage 3--stable; E. Coli UTI. Discharge is dependent on clinical improvement and clearance by ID.     Medications reviewed; sliding scale Novolog. Labs reviewed; CBG: 108 mg/dL today.     Diet Order:   Diet Order            Diet regular Room service appropriate? Yes; Fluid consistency: Thin  Diet effective now              EDUCATION NEEDS:   No education needs have been identified at this time  Skin:  Skin Assessment: Reviewed RN Assessment  Last BM:  1/8  Height:   Ht Readings from Last 1 Encounters:  05/19/18 '6\' 1"'  (1.854 m)    Weight:   Wt Readings from Last 1 Encounters:  05/19/18 105.7 kg    Ideal Body Weight:  83.6 kg  BMI:  Body mass index is 30.74 kg/m.  Estimated Nutritional Needs:   Kcal:  2200-2400  Protein:  100-110g  Fluid:   2L/day     Roger Matin, MS, RD, LDN, CNSC Inpatient Clinical Dietitian Pager # [phone] After hours/weekend pager # [phone]

## 2018-05-26 LAB — GLUCOSE, CAPILLARY
GLUCOSE-CAPILLARY: 118 mg/dL — AB (ref 70–99)
Glucose-Capillary: 107 mg/dL — ABNORMAL HIGH (ref 70–99)
Glucose-Capillary: 127 mg/dL — ABNORMAL HIGH (ref 70–99)

## 2018-05-29 ENCOUNTER — Other Ambulatory Visit: Payer: Self-pay

## 2018-05-29 DIAGNOSIS — J189 Pneumonia, unspecified organism: Secondary | ICD-10-CM | POA: Diagnosis not present

## 2018-05-29 DIAGNOSIS — R197 Diarrhea, unspecified: Secondary | ICD-10-CM | POA: Diagnosis not present

## 2018-05-29 LAB — CULTURE, BLOOD (ROUTINE X 2)
Culture: NO GROWTH
Culture: NO GROWTH
Special Requests: ADEQUATE
Special Requests: ADEQUATE

## 2018-05-29 NOTE — Patient Outreach (Signed)
[facility]) Care Management  05/29/2018  Roger Blackwell 30-Jan-1936 [id number]   EMMI-GENERAL DISCHARGE  RED ON EMMI ALERT Day # #1  Date: 05/27/18  5:00 PM Red Alert Reason: "Got discharge papers? No", "Know who to call about changes in condition? No", "Scheduled follow-up? No"   Outreach attempt # 1 successful.    Spoke with patient, HIPAA verified.   Reviewed and addressed red alerts. Patient states he received his d/c papers and denies having any questions at this time. He has all of his medications in the home and reports taking them as prescribed. He denies having questions or financial hardship paying for his medications.   Patient lives with his wife who is available to assist him as needed. He denies having issues with transportation. Patient states he has a physical scheduled for the end of the month to f/u with his PCP. He is aware of his f/u with Dr. [name] scheduled for 06/19/18.   Patient verbalizes understanding about who to call to report changes in his condition. RN CM provided patient with the 24/7 nurse line phone # for non-urgent calls and instructed patient to call 911 for emergencies. He verbalizes understanding. Patient is currently having diarrhea several times per day x 2 days. He continues to take his prescribed antibiotics. Patient states the "stools are runny". RN CM provided education to patient related to potential complications secondary to untreated diarrhea including dehydration and hypokalemia. RN CM discussed potential for development of C-diff. RN CM instructed patient to drink plenty of fluids, especially water and to report his symptoms to his PCP today. Patient verbalizes understanding and states he will consider calling his PCP if his symptoms do not improve. He is agreeable to a call back.   Plan: RN CM will outreach to patient in 3-4 days to assess for CM needs and resolution of diarrhea.    Barb Merino, RN,CCM [facility] Care  Management Care Management Coordinator Direct Phone: [phone] Toll Free: [phone] Fax: [phone]

## 2018-05-31 ENCOUNTER — Other Ambulatory Visit: Payer: Self-pay

## 2018-05-31 NOTE — Patient Outreach (Signed)
[facility]) Care Management  05/31/2018  Roger Blackwell December 14, 1935 [id number]    EMMI-GENERAL DISCHARGE  RED ON EMMI ALERT Day # 4 Date: 05/30/18  2:10 PM Red Alert Reason: "Lost interest in things? Yes", "Sad/hopeless/anxious/empty? Yes"  EMMI Red Alerts will be addressed with patient at next scheduled follow-up call set for 06/02/18.   Plan: RN CM will outreach to patient for EMMI-General discharge Red Alerts and GI symptom management at next scheduled f/u call set for 06/02/18.     Barb Merino, RN,CCM [facility] Care Management Care Management Coordinator Direct Phone: [phone] Toll Free: [phone] Fax: [phone]

## 2018-06-02 ENCOUNTER — Other Ambulatory Visit: Payer: Self-pay

## 2018-06-02 NOTE — Patient Outreach (Signed)
[facility]) Care Management  06/02/2018  Roger Blackwell Mar 27, 1936 [id number]   EMMI-GENERAL DISCHARGE FOLLOW-UP ATTEMPT  RED ON EMMI ALERT Day # 4 Date: 05/30/18  2:10 PM Red Alert Reason: "Lost interest in things? Yes", "Sad/hopeless/anxious/empty? Yes"  Outreach FOLLOW-UP attempt # 1 successful.     Spoke with patient, HIPAA verified.   Reviewed and addressed red alerts. Patient confirms he is feeling better. He is not feeling as down and states "I am getting stronger each day". He reports ambulating without the use of his cane due his balance has improved. He will f/u with his Prostate doctor on Monday and is aware of his f/u with the Pulmonologist on 06/19/18. He denies having any issues with transportation at this time.   GI: Patient reports his diarrhea has subsided. He reports still having some loose stools but states it is minimal and he is able to "control it". He is drinking plenty of fluids but has a poor appetite. He is drinking protein shakes daily. Patient denies having any questions or concerns at this time. He is very appreciative of the follow up call today.   Plan: RN CM will close case due to patient is stable and no further CM needs are identified at this time.    Barb Merino, RN,CCM [facility] Care Management Care Management Coordinator Direct Phone: [phone] Toll Free: [phone] Fax: [phone]

## 2018-06-05 DIAGNOSIS — Z5111 Encounter for antineoplastic chemotherapy: Secondary | ICD-10-CM | POA: Diagnosis not present

## 2018-06-08 LAB — FUNGUS CULTURE WITH STAIN

## 2018-06-09 DIAGNOSIS — I10 Essential (primary) hypertension: Secondary | ICD-10-CM | POA: Diagnosis not present

## 2018-06-09 DIAGNOSIS — E1121 Type 2 diabetes mellitus with diabetic nephropathy: Secondary | ICD-10-CM | POA: Diagnosis not present

## 2018-06-14 DIAGNOSIS — J189 Pneumonia, unspecified organism: Secondary | ICD-10-CM | POA: Diagnosis not present

## 2018-06-15 ENCOUNTER — Encounter: Payer: Self-pay | Admitting: Family

## 2018-06-15 ENCOUNTER — Ambulatory Visit: Payer: Medicare Other | Admitting: Family

## 2018-06-15 VITALS — BP 116/68 | HR 83 | Temp 97.6°F | Ht 74.0 in | Wt 258.0 lb

## 2018-06-15 DIAGNOSIS — J850 Gangrene and necrosis of lung: Secondary | ICD-10-CM | POA: Diagnosis not present

## 2018-06-15 NOTE — Progress Notes (Signed)
Subjective:    Patient ID: Roger Blackwell, male    DOB: 1935/07/26, 83 y.o.   MRN: [id number]  Chief Complaint  Patient presents with  . Pneumonia    cough; weakness from hospital stay    HPI:  Roger Blackwell is a 83 y.o. male who presents today for initial office visit following hospitalization.   [name] has a previous medical history of diabetes, prostate cancer on Lupron, CHF, hypertension, and tobacco use (37 years, 1 pack per day at heaviest) presented to [facility] with 4 day history of hemoptysis. CT imaging of his chest with extensive consolidation in the righ lower lobe with small pockets of gas along the right lower lobe periphery; nodular opacity at the right lung base measuring 3.7 cm.; small irregular nodular density in the left upper lobe measuring 0.9 x 0.5 cm all concerning for severe infectious or inflammatory process with malignancy not being able to be ruled out. Diagnosed with necrotizing pneumonia and started on ceftriaxone and metronidazole initially. Bronchoscopy performed on 1/7 with no organisms on gram stain and cultures showing normal respiratory flora.  AFB smear was negative with cultures remaining pending at this time. Pathology with benign reactive/reparitive changes. He did have a fever on 1/8 which was believed to be related to his bronchoscopy. Discharged with 4 weeks of Augmentin. Will be following up with Pulmonology to determine clearing of nodules and need for possible PET scan as he has had 40-50 lbs of weight lose, decreased appetite and occasional nigh sweats. All hospital records, labs and imaging were reviewed in detail.    Since leaving the hospital he continues to have cough that has improved since being on the Augmentin. Cough is described as productive with phlegm that is described as clear. Has not had any episodes of hemoptysis. Has had some subjective fevers. Also been feeling weak. Appetite has improved a little. He has had some  looser stools since starting the Augmentin.    Allergies  Allergen Reactions  . Ivp Dye [Iodinated Diagnostic Agents]       Outpatient Medications Prior to Visit  Medication Sig Dispense Refill  . amoxicillin-clavulanate (AUGMENTIN) 875-125 MG tablet Take 1 tablet by mouth 2 (two) times daily for 28 days. 56 tablet 0  . allopurinol (ZYLOPRIM) 100 MG tablet Take 300 mg by mouth daily. Take 3 tabs (300mg ) PO daily    . aspirin 81 MG tablet Take 81 mg by mouth daily.    . furosemide (LASIX) 80 MG tablet Take 1 tablet (80 mg total) by mouth daily. (Patient taking differently: Take 80 mg by mouth daily as needed for fluid. ) 30 tablet 6  . insulin detemir (LEVEMIR) 100 UNIT/ML injection Inject 30 Units into the skin daily.    . insulin lispro (HUMALOG KWIKPEN) 100 UNIT/ML KwikPen Inject 8 Units into the skin 3 (three) times daily.    . iron polysaccharides (NIFEREX) 150 MG capsule Take 150 mg by mouth daily.     Marland Kitchen losartan (COZAAR) 100 MG tablet Take 1 tablet (100 mg total) by mouth daily. 90 tablet 3  . pantoprazole (PROTONIX) 40 MG tablet Take 40 mg by mouth daily.   0  . Potassium 99 MG TABS Take 1 tablet by mouth every other day.    . rosuvastatin (CRESTOR) 20 MG tablet Take 20 mg by mouth daily.    . Travoprost, BAK Free, (TRAVATAN) 0.004 % SOLN ophthalmic solution Place 1 drop into both eyes at bedtime.    Marland Kitchen  vitamin B-12 (CYANOCOBALAMIN) 500 MCG tablet Take 500 mcg by mouth daily.     No facility-administered medications prior to visit.      Past Medical History:  Diagnosis Date  . CAD (coronary artery disease)   . CHF (congestive heart failure) ([place])   . DM (diabetes mellitus) ([place])   . Dyslipidemia   . Gout   . Morbid obesity ([place])   . Systemic hypertension       Past Surgical History:  Procedure Laterality Date  . BACK SURGERY    . CORONARY ANGIOPLASTY WITH STENT PLACEMENT  12/26/2000   80% prox. circumflex lesion w/successful stent placement.  Marland Kitchen NM MYOVIEW LTD   08/31/2010   Normal  . PERICARDIOCENTESIS    . PROSTATE SURGERY    . US ECHOCARDIOGRAPHY  08/31/2010   EF 50-55%,RV mildly dilated,mild aortic root dilatation  . VIDEO BRONCHOSCOPY Bilateral 05/23/2018   Procedure: VIDEO BRONCHOSCOPY WITHOUT FLUORO;  Surgeon: Marshell Garfinkel, MD;  Location: [facility]OSCOPY;  Service: Cardiopulmonary;  Laterality: Bilateral;      Family History  Problem Relation Age of Onset  . Diabetes Brother   . Hypertension Sister       Social History   Socioeconomic History  . Marital status: Married    Spouse name: Not on file  . Number of children: Not on file  . Years of education: Not on file  . Highest education level: Not on file  Occupational History  . Not on file  Social Needs  . Financial resource strain: Not on file  . Food insecurity:    Worry: Not on file    Inability: Not on file  . Transportation needs:    Medical: Not on file    Non-medical: Not on file  Tobacco Use  . Smoking status: Former Smoker    Types: Cigarettes    Last attempt to quit: 05/16/1993    Years since quitting: 25.0  . Smokeless tobacco: Never Used  Substance and Sexual Activity  . Alcohol use: Yes    Comment: seldom  . Drug use: No  . Sexual activity: Not on file  Lifestyle  . Physical activity:    Days per week: Not on file    Minutes per session: Not on file  . Stress: Not on file  Relationships  . Social connections:    Talks on phone: Not on file    Gets together: Not on file    Attends religious service: Not on file    Active member of club or organization: Not on file    Attends meetings of clubs or organizations: Not on file    Relationship status: Not on file  . Intimate partner violence:    Fear of current or ex partner: Not on file    Emotionally abused: Not on file    Physically abused: Not on file    Forced sexual activity: Not on file  Other Topics Concern  . Not on file  Social History Narrative  . Not on file      Review of  Systems  Constitutional: Positive for fatigue. Negative for chills, diaphoresis and fever.  HENT: Negative for congestion, sinus pressure, sinus pain and sore throat.   Respiratory: Positive for cough and wheezing. Negative for chest tightness and shortness of breath.   Cardiovascular: Negative for chest pain and leg swelling.  Gastrointestinal: Negative for constipation, diarrhea, nausea and vomiting.  Neurological: Positive for weakness. Negative for dizziness and headaches.  Objective:    BP 116/68   Pulse 83   Temp 97.6 F (36.4 C) (Oral)   Ht 6\' 2"  (1.88 m)   Wt 258 lb (117 kg)   SpO2 97%   BMI 33.13 kg/m  Nursing note and vital signs reviewed.  Physical Exam Constitutional:      General: He is not in acute distress.    Appearance: He is well-developed. He is ill-appearing.  Cardiovascular:     Rate and Rhythm: Normal rate and regular rhythm.     Heart sounds: Normal heart sounds.  Pulmonary:     Effort: Pulmonary effort is normal.     Breath sounds: Examination of the right-upper field reveals rhonchi. Examination of the right-middle field reveals rhonchi. Examination of the right-lower field reveals rhonchi. Wheezing and rhonchi present.  Skin:    General: Skin is warm and dry.  Neurological:     Mental Status: He is alert and oriented to person, place, and time.  Psychiatric:        Behavior: Behavior normal.        Thought Content: Thought content normal.        Judgment: Judgment normal.         Assessment & Plan:   Problem List Items Addressed This Visit      Respiratory   Necrotizing pneumonia ([place]) - Primary    Mr. Szatkowski continues to take Augmentin for necrotizing pneumonia with persistent but improving productive cough. No fevers. There remains concerns for non-infectious cause of his symptoms including malignancy. Discussed the pathogenesis of pneumonia and cough being the last symptom to resolve. He has 1 week of Augmentin remaining and  following up with pulmonology on 06/19/18. Follow up pending pulmonology work up. Continue current dose of Augmentin.          I am having Talik Ravan maintain his aspirin, Travoprost (BAK Free), furosemide, pantoprazole, iron polysaccharides, losartan, Potassium, vitamin B-12, rosuvastatin, insulin detemir, insulin lispro, allopurinol, and amoxicillin-clavulanate.   Follow-up: Return in about 1 month (around 07/15/2018), or if symptoms worsen or fail to improve.    Terri Piedra, MSN, FNP-C Nurse Practitioner [facility] Office phone: [phone] Pager: [place] number: [phone]

## 2018-06-15 NOTE — Assessment & Plan Note (Signed)
Mr. Rowlette continues to take Augmentin for necrotizing pneumonia with persistent but improving productive cough. No fevers. There remains concerns for non-infectious cause of his symptoms including malignancy. Discussed the pathogenesis of pneumonia and cough being the last symptom to resolve. He has 1 week of Augmentin remaining and following up with pulmonology on 06/19/18. Follow up pending pulmonology work up. Continue current dose of Augmentin.

## 2018-06-15 NOTE — Patient Instructions (Signed)
Nice to meet you!  Continue to take your Augmentin as prescribed until completed.  Follow up with Dr. [name] as scheduled.  Follow up with ID pending Pulmonology plan.

## 2018-06-19 ENCOUNTER — Ambulatory Visit (INDEPENDENT_AMBULATORY_CARE_PROVIDER_SITE_OTHER)
Admission: RE | Admit: 2018-06-19 | Discharge: 2018-06-19 | Disposition: A | Discharge status: Home / Self Care | Payer: Medicare Other | Source: Ambulatory Visit | Attending: Internal Medicine | Admitting: Internal Medicine

## 2018-06-19 ENCOUNTER — Ambulatory Visit: Payer: Medicare Other | Admitting: Internal Medicine

## 2018-06-19 ENCOUNTER — Encounter: Payer: Self-pay | Admitting: Internal Medicine

## 2018-06-19 VITALS — BP 138/64 | HR 87 | Ht 74.0 in | Wt 258.8 lb

## 2018-06-19 DIAGNOSIS — J189 Pneumonia, unspecified organism: Secondary | ICD-10-CM | POA: Diagnosis not present

## 2018-06-19 DIAGNOSIS — J850 Gangrene and necrosis of lung: Secondary | ICD-10-CM | POA: Diagnosis not present

## 2018-06-19 LAB — BASIC METABOLIC PANEL
BUN: 19 mg/dL (ref 6–23)
CO2: 29 mEq/L (ref 19–32)
CREATININE: 1 mg/dL (ref 0.40–1.50)
Calcium: 9.8 mg/dL (ref 8.4–10.5)
Chloride: 100 mEq/L (ref 96–112)
GFR: 86.92 mL/min (ref >60.00)
Glucose, Bld: 89 mg/dL (ref 70–99)
Potassium: 3.8 mEq/L (ref 3.5–5.1)
Sodium: 136 mEq/L (ref 135–145)

## 2018-06-19 LAB — CBC WITH DIFFERENTIAL/PLATELET
Basophils Absolute: 0.1 10*3/uL (ref 0.0–0.1)
Basophils Relative: 0.5 % (ref 0.0–3.0)
Eosinophils Absolute: 0.4 10*3/uL (ref 0.0–0.7)
Eosinophils Relative: 2.6 % (ref 0.0–5.0)
HCT: 32.2 % — ABNORMAL LOW (ref 39.0–52.0)
Hemoglobin: 10.1 g/dL — ABNORMAL LOW (ref 13.0–17.0)
Lymphocytes Relative: 15 % (ref 12.0–46.0)
Lymphs Abs: 2.1 10*3/uL (ref 0.7–4.0)
MCHC: 31.4 g/dL (ref 30.0–36.0)
MCV: 82.2 fl (ref 78.0–100.0)
Monocytes Absolute: 0.9 10*3/uL (ref 0.1–1.0)
Monocytes Relative: 6.7 % (ref 3.0–12.0)
Neutro Abs: 10.6 10*3/uL — ABNORMAL HIGH (ref 1.4–7.7)
Neutrophils Relative %: 75.2 % (ref 43.0–77.0)
PLATELETS: 303 10*3/uL (ref 150.0–400.0)
RBC: 3.91 Mil/uL — ABNORMAL LOW (ref 4.22–5.81)
RDW: 15.5 % (ref 11.5–15.5)
WBC: 14 10*3/uL — ABNORMAL HIGH (ref 4.0–10.5)

## 2018-06-19 LAB — SEDIMENTATION RATE: Sed Rate: 57 mm/hr — ABNORMAL HIGH (ref 0–20)

## 2018-06-19 MED ORDER — ACETAMINOPHEN-CODEINE #3 300-30 MG PO TABS: 1.0000 | ORAL_TABLET | ORAL | 0 refills | Status: AC | PRN

## 2018-06-19 NOTE — Progress Notes (Signed)
Roger Blackwell, male    DOB: 03/03/1936,     MRN: [id number]   Brief patient profile:  82 yobm quit smoking 1994 at time of chest surgery by Roger Blackwell for empyema (records suggest 2000 for date but pt thinks it was 1994)  with new feeling bad Oct 2019 loss of appetite cough sometimes bloody x end of Dec 2019 > admit 1/3-01/2019   PCCM eval:   former smoker, admitted with concern for necrotizing PNA.  Additionally, found to have left pleural based mass, pleural thickening and pleural plaques.  He has had 40-50lb weight loss, decreased appetite, occasional night sweats.  He has had pleural thickening dating back to 2014.  Concern for possible PNA but constellation of symptoms also worrisome for malignancy.  Must also consider aspiration.      Necrotizing PNA with Hemoptysis -R dense consolidation with pleural effusion with gas P: Bronch results reviewed with normal respiratory flora, malignancy Agree with 4 weeks of Augmentin Follow-up with Roger Blackwell on 2/3 with repeat imaging to make sure the RLL opacity is clearing up. If opacity is persistent then can consider PET scan.  LUL Pleural Based Mass  P: Follow up as outpatient  Pleural Plaques -no hx of known asbestos exposure  P: Follow up CT imaging  Hx of Empyema -around 2000, s/p thoracotomy by Roger. Arlyce Blackwell     History of Present Illness  06/19/2018  Pulmonary/ 1st office eval/Roger Blackwell  Has completed 3 out of 4 weeks of augmentin planned Chief Complaint  Patient presents with  . Consult  Dyspnea:  7/-11 not HT  Cough: occ small chunks of old dark blood, cough is worse p stir in am / at hs and noct bothers wife but not pt   Sleep: flat on back / 2 pillows  SABA use: none  Pt is edentulous, no risk factors identified for aspiration.  No obvious day to day or daytime variability or assoc excess/ purulent sputum or mucus plugs or   cp or chest tightness, subjective wheeze or overt sinus or hb symptoms.    Also denies any obvious  fluctuation of symptoms with weather or environmental changes or other aggravating or alleviating factors except as outlined above   No unusual exposure hx or h/o childhood pna/ asthma or knowledge of premature birth.  Current Allergies, Complete Past Medical History, Past Surgical History, Family History, and Social History were reviewed in [employer] record.  ROS  The following are not active complaints unless bolded Hoarseness, sore throat, dysphagia, dental problems, itching, sneezing,  nasal congestion or discharge of excess mucus or purulent secretions, ear ache,   fever, chills, sweats, unintended wt loss or wt gain, classically pleuritic or exertional cp,  orthopnea pnd or arm/hand swelling  or leg swelling, presyncope, palpitations, abdominal pain, anorexia, nausea, vomiting, diarrhea  or change in bowel habits or change in bladder habits, change in stools or change in urine, dysuria, hematuria,  rash, arthralgias, visual complaints, headache, numbness, weakness or ataxia or problems with walking or coordination,  change in mood or  memory.           Past Medical History:  Diagnosis Date  . CAD (coronary artery disease)   . CHF (congestive heart failure) ([place])   . DM (diabetes mellitus) ([place])   . Dyslipidemia   . Gout   . Morbid obesity ([place])   . Systemic hypertension     Outpatient Medications Prior to Visit  Medication Sig Dispense Refill  . allopurinol (  ZYLOPRIM) 100 MG tablet Take 300 mg by mouth daily. Take 3 tabs (300mg ) PO daily    . amoxicillin-clavulanate (AUGMENTIN) 875-125 MG tablet Take 1 tablet by mouth 2 (two) times daily for 28 days. 56 tablet 0  . aspirin 81 MG tablet Take 81 mg by mouth daily.    . furosemide (LASIX) 80 MG tablet Take 1 tablet (80 mg total) by mouth daily. (Patient taking differently: Take 80 mg by mouth daily as needed for fluid. ) 30 tablet 6  . insulin detemir (LEVEMIR) 100 UNIT/ML injection Inject 30 Units into the  skin daily.    . insulin lispro (HUMALOG KWIKPEN) 100 UNIT/ML KwikPen Inject 8 Units into the skin 3 (three) times daily.    . iron polysaccharides (NIFEREX) 150 MG capsule Take 150 mg by mouth daily.     Marland Kitchen losartan (COZAAR) 100 MG tablet Take 1 tablet (100 mg total) by mouth daily. 90 tablet 3  . pantoprazole (PROTONIX) 40 MG tablet Take 40 mg by mouth daily.   0  . Potassium 99 MG TABS Take 1 tablet by mouth every other day.    . rosuvastatin (CRESTOR) 20 MG tablet Take 20 mg by mouth daily.    . Travoprost, BAK Free, (TRAVATAN) 0.004 % SOLN ophthalmic solution Place 1 drop into both eyes at bedtime.    . vitamin B-12 (CYANOCOBALAMIN) 500 MCG tablet Take 500 mcg by mouth daily.        Objective:     BP 138/64 (BP Location: Right Arm, Patient Position: Sitting, Cuff Size: Normal)   Pulse 87   Ht 6\' 2"  (1.88 m)   Wt 258 lb 12.8 oz (117.4 kg)   SpO2 97%   BMI 33.23 kg/m   SpO2: 97 %  RA   Wt Readings from Last 3 Encounters:  06/19/18 258 lb 12.8 oz (117.4 kg)  06/15/18 258 lb (117 kg)  05/19/18 233 lb (105.7 kg)    Obese amb bm nad    HEENT: Edentulous,  Nl turbinates bilaterally, and oropharynx. Nl external ear canals without cough reflex   NECK :  without JVD/Nodes/TM/ nl carotid upstrokes bilaterally   LUNGS: no acc muscle use,  Mild barrel contour chest with scattered insp and exp rhonchi bilaterally R > L   CV:  RRR  no s3 or murmur or increase in P2, and trace ankle edema bilaterally    ABD:  soft and nontender with nl inspiratory excursion in the supine position. No bruits or organomegaly appreciated, bowel sounds nl  MS:  Nl gait/ ext warm without deformities, calf tenderness, cyanosis or clubbing No obvious joint restrictions   SKIN: warm and dry without lesions    NEURO:  alert, approp, nl sensorium with  no motor or cerebellar deficits apparent.      CXR PA and Lateral:   06/19/2018 :    I personally reviewed images and agree with radiology impression as  follows: Continued masslike airspace opacity in the right lower lobe. There now is an air-fluid level noted c/w with cavitation or abscess formation. / reviewed with Roger Blackwell, this is probably in the sup segment     Labs ordered/ reviewed:      Chemistry      Component Value Date/Time   NA 136 06/19/2018 1514   NA 139 02/01/2017 0829   K 3.8 06/19/2018 1514   K 4.2 02/01/2017 0829   CL 100 06/19/2018 1514   CO2 29 06/19/2018 1514   CO2 22 02/01/2017  0829   BUN 19 06/19/2018 1514   BUN 36.1 (H) 02/01/2017 0829   CREATININE 1.00 06/19/2018 1514   CREATININE 1.26 (H) 02/23/2018 0936   CREATININE 1.5 (H) 02/01/2017 0829      Component Value Date/Time   CALCIUM 9.8 06/19/2018 1514   CALCIUM 9.4 02/01/2017 0829   ALKPHOS 75 02/23/2018 0936   ALKPHOS 89 02/01/2017 0829   AST 17 02/23/2018 0936   AST 20 02/01/2017 0829   ALT 15 02/23/2018 0936   ALT 16 02/01/2017 0829   BILITOT 0.4 02/23/2018 0936   BILITOT 0.27 02/01/2017 0829        Lab Results  Component Value Date   WBC 14.0 (H) 06/19/2018   HGB 10.1 (L) 06/19/2018   HCT 32.2 (L) 06/19/2018   MCV 82.2 06/19/2018   PLT 303.0 06/19/2018          Lab Results  Component Value Date   ESRSEDRATE 57 (H) 06/19/2018            Assessment   Necrotizing pneumonia ([place]) ? Onset of symptoms oct 2019  - FOB  05/23/2018  No obst:  RLL Basal segment sampling:  Nl flora, no malignancy :  afb smears neg/ fungus studies ordered but not done   Hx assoc with hemoptysis strongly suggests underlying maligancy and probably needs CT directed bx though there is some risk of creating bronchopleural fistula here if he doesn't have one already.    Discussed in detail all the  indications, usual  risks and alternatives  relative to the benefits with patient who agrees to proceed with w/u as outlined.      rec proceed with PET scan  D/c asa if active bleeding    I had an extended discussion with the patient reviewing all  relevant studies completed to date (including imaging studies, fob results)  and  lasting 25 minutes of a 40  minute transition of care office visit with pt new to me   re  severe non-specific but potentially very serious refractory respiratory symptoms of uncertain and potentially multiple  etiologies.   Each maintenance medication was reviewed in detail including most importantly the difference between maintenance and prns and under what circumstances the prns are to be triggered using an action plan format that is not reflected in the computer generated alphabetically organized AVS.    Please see AVS for specific instructions unique to this office visit that I personally wrote and verbalized to the the pt in detail and then reviewed with pt  by my nurse highlighting any changes in therapy/plan of care  recommended at today's visit.         Christinia Gully, MD 06/19/2018

## 2018-06-19 NOTE — Patient Instructions (Addendum)
Finish taking all young antibiotics   Stop aspirin if the bleeding worsens - ok to restart if not bleeding x 3 straight days   protonix 40 mg Take 30- 60 min before your first and last meals of the day and supplement with tyl #3 one every 4 hours as needed.   For mucinex dm 1200 mg every 12 hours as needed   Please remember to go to the  x-ray department  for your tests - we will call you with the results when they are available    Please schedule a follow up office visit in 4 weeks, sooner if needed  - add:  Discussed with Dr Ardeen Garland in radiology > PET next step > sent req to schedulers

## 2018-06-20 ENCOUNTER — Encounter: Payer: Self-pay | Admitting: Internal Medicine

## 2018-06-20 NOTE — Assessment & Plan Note (Addendum)
?   Onset of symptoms oct 2019  - FOB  05/23/2018  No obst:  RLL Basal segment sampling:  Nl flora, no malignancy :  afb smears neg/ fungus studies ordered but not done   Hx assoc with hemoptysis strongly suggests underlying maligancy and probably needs CT directed bx though there is some risk of creating bronchopleural fistula here if he doesn't have one already.    Discussed in detail all the  indications, usual  risks and alternatives  relative to the benefits with patient who agrees to proceed with w/u as outlined.      rec proceed with PET scan  D/c asa if active bleeding    I had an extended discussion with the patient reviewing all relevant studies completed to date (including imaging studies, fob results)  and  lasting 25 minutes of a 40  minute transition of care office visit with pt new to me   re  severe non-specific but potentially very serious refractory respiratory symptoms of uncertain and potentially multiple  etiologies.   Each maintenance medication was reviewed in detail including most importantly the difference between maintenance and prns and under what circumstances the prns are to be triggered using an action plan format that is not reflected in the computer generated alphabetically organized AVS.    Please see AVS for specific instructions unique to this office visit that I personally wrote and verbalized to the the pt in detail and then reviewed with pt  by my nurse highlighting any changes in therapy/plan of care  recommended at today's visit.

## 2018-06-21 NOTE — Progress Notes (Signed)
ATC, NA and no option to leave msg 

## 2018-06-22 ENCOUNTER — Encounter: Payer: Self-pay | Admitting: Internal Medicine

## 2018-06-30 DIAGNOSIS — I10 Essential (primary) hypertension: Secondary | ICD-10-CM | POA: Diagnosis not present

## 2018-06-30 DIAGNOSIS — E7800 Pure hypercholesterolemia, unspecified: Secondary | ICD-10-CM | POA: Diagnosis not present

## 2018-06-30 DIAGNOSIS — E1121 Type 2 diabetes mellitus with diabetic nephropathy: Secondary | ICD-10-CM | POA: Diagnosis not present

## 2018-06-30 DIAGNOSIS — J189 Pneumonia, unspecified organism: Secondary | ICD-10-CM | POA: Diagnosis not present

## 2018-07-04 ENCOUNTER — Telehealth: Payer: Self-pay | Admitting: Internal Medicine

## 2018-07-04 DIAGNOSIS — J850 Gangrene and necrosis of lung: Secondary | ICD-10-CM

## 2018-07-04 MED ORDER — ACETAMINOPHEN-CODEINE #3 300-30 MG PO TABS: 1.0000 | ORAL_TABLET | ORAL | 0 refills | Status: DC | PRN

## 2018-07-04 NOTE — Telephone Encounter (Signed)
atc pt again, line rang several times and asked to enter remote access code.  Wcb.

## 2018-07-04 NOTE — Telephone Encounter (Signed)
Called and spoke with Patient.  He stated that he is having headaches again.  He stated that when he had headaches before Dr [name] prescribed Tylenol 3, and that helped.  He is having them every morning, and has been taking Tylenol every 4 -5 hours daily. He denies any other symptoms, or changes.   Patient request any prescriptions be sent to [facility].  06/19/18     Instructions   Finish taking all young antibiotics   Stop aspirin if the bleeding worsens - ok to restart if not bleeding x 3 straight days   protonix 40 mg Take 30- 60 min before your first and last meals of the day and supplement with tyl #3 one every 4 hours as needed.   For mucinex dm 1200 mg every 12 hours as needed   Please remember to go to the  x-ray department  for your tests - we will call you with the results when they are available    Please schedule a follow up office visit in 4 weeks, sooner if needed  - add:  Discussed with Dr Ardeen Garland in radiology > PET next step > sent req to schedulers     Message routed to Dr [name]

## 2018-07-04 NOTE — Telephone Encounter (Signed)
atc pt X2, line rang several times then again asked to enter remote access code.  Tylenol #3 rx was called in to [facility].   PET was never ordered per AVS- will order once speaking to pt to make aware of recs.

## 2018-07-04 NOTE — Telephone Encounter (Signed)
Patient's wife calling back says no one has called them back regarding previous phone message. Call back number is [phone] will route back to triage

## 2018-07-04 NOTE — Telephone Encounter (Signed)
atc pt, line rang several times and then asked me to enter remote access code.  Wcb.

## 2018-07-04 NOTE — Telephone Encounter (Signed)
If you look at the instructions the Tyl #3 is for cough, no ha, but I'm glad to hear it helped   Needs to get PET done as rec on instructions and ok to refill tyl#3 x one but get the studies done asap

## 2018-07-05 ENCOUNTER — Other Ambulatory Visit: Payer: Self-pay | Admitting: Internal Medicine

## 2018-07-05 DIAGNOSIS — J850 Gangrene and necrosis of lung: Secondary | ICD-10-CM

## 2018-07-05 NOTE — Telephone Encounter (Signed)
Spoke with the pt He is aware that the rx was sent  I have ordered PET stat

## 2018-07-06 LAB — ACID FAST CULTURE WITH REFLEXED SENSITIVITIES (MYCOBACTERIA): Acid Fast Culture: NEGATIVE

## 2018-07-10 ENCOUNTER — Ambulatory Visit (HOSPITAL_COMMUNITY)
Admission: RE | Admit: 2018-07-10 | Discharge: 2018-07-10 | Disposition: A | Discharge status: Home / Self Care | Payer: Medicare Other | Source: Ambulatory Visit | Attending: Internal Medicine | Admitting: Internal Medicine

## 2018-07-10 DIAGNOSIS — J850 Gangrene and necrosis of lung: Secondary | ICD-10-CM | POA: Insufficient documentation

## 2018-07-10 LAB — GLUCOSE, CAPILLARY: Glucose-Capillary: 105 mg/dL — ABNORMAL HIGH (ref 70–99)

## 2018-07-10 MED ORDER — FLUDEOXYGLUCOSE F - 18 (FDG) INJECTION
14.1900 | Freq: Once | INTRAVENOUS | Status: AC | PRN
  Administered 2018-07-10: 14.19 via INTRAVENOUS

## 2018-07-12 ENCOUNTER — Other Ambulatory Visit: Payer: Self-pay | Admitting: Internal Medicine

## 2018-07-12 DIAGNOSIS — J850 Gangrene and necrosis of lung: Secondary | ICD-10-CM

## 2018-07-12 NOTE — Progress Notes (Signed)
Spoke with pt and notified of results per Dr. [name]. Pt verbalized understanding and denied any questions. He is having cough and SOB  Will keep planned appt and go ahead and place order for referral

## 2018-07-17 ENCOUNTER — Ambulatory Visit: Payer: Medicare Other | Admitting: Internal Medicine

## 2018-07-17 ENCOUNTER — Encounter: Payer: Self-pay | Admitting: Internal Medicine

## 2018-07-17 ENCOUNTER — Ambulatory Visit (INDEPENDENT_AMBULATORY_CARE_PROVIDER_SITE_OTHER)
Admission: RE | Admit: 2018-07-17 | Discharge: 2018-07-17 | Disposition: A | Discharge status: Home / Self Care | Payer: Medicare Other | Source: Ambulatory Visit | Attending: Internal Medicine | Admitting: Internal Medicine

## 2018-07-17 VITALS — BP 128/62 | HR 92 | Ht 74.0 in | Wt 248.0 lb

## 2018-07-17 DIAGNOSIS — J850 Gangrene and necrosis of lung: Secondary | ICD-10-CM

## 2018-07-17 DIAGNOSIS — R0609 Other forms of dyspnea: Secondary | ICD-10-CM

## 2018-07-17 DIAGNOSIS — J189 Pneumonia, unspecified organism: Secondary | ICD-10-CM | POA: Diagnosis not present

## 2018-07-17 NOTE — Patient Instructions (Signed)
Keep appt to see Dr Nils Pyle as planned and stay as active as you can in the meantime  Please remember to go to the  x-ray department  for your tests - we will call you with the results when they are available    Call if condition worsens between now and your appt with Dr Nils Pyle

## 2018-07-17 NOTE — Assessment & Plan Note (Addendum)
Onset Oct 2019  - Spirometry 07/17/2018  FEV1 1.5 (47%)  Ratio 0.89 s curvature off all rx  - 07/17/2018   Walked RA  2 laps @  approx 220ft each @ avg pace  stopped due to end of study, no sob with sats 99% at end     Pattern is restrictive not obstructive and not clear whether has had some lung removed on the L but could certainly tolerate exploratory surgery/ segmentectomy if isolated to the sup segment on the R but certainly nothing else to offer thru this clinic and needs to pursue surgical dx/rx if possible.     No preop "tune up " feasible in this setting.

## 2018-07-17 NOTE — Progress Notes (Signed)
Roger Blackwell, male    DOB: [birth year],     MRN: [id number]   Brief patient profile:  82 yobm quit smoking 1994 at time of L chest surgery by Arlyce Dice for empyema (records suggest 2000 for date but pt thinks it was 1994)  with new feeling bad Oct 2019 loss of appetite cough sometimes bloody x end of Dec 2019 > admit 1/3-01/2019   PCCM eval:   former smoker, admitted with concern for necrotizing PNA.  Additionally, found to have left pleural based mass, pleural thickening and pleural plaques.  He has had 40-50lb weight loss, decreased appetite, occasional night sweats.  He has had pleural thickening dating back to 2014.  Concern for possible PNA but constellation of symptoms also worrisome for malignancy.  Must also consider aspiration.      Necrotizing PNA with Hemoptysis -R dense consolidation with pleural effusion with gas P: Bronch results reviewed with normal respiratory flora, malignancy Agree with 4 weeks of Augmentin Follow-up with Dr. [name] on 2/3 with repeat imaging to make sure the RLL opacity is clearing up. If opacity is persistent then can consider PET scan.  LUL Pleural Based Mass  P: Follow up as outpatient  Pleural Plaques -no hx of known asbestos exposure  P: Follow up CT imaging  Hx of Empyema -around 2000, s/p L  thoracotomy by Dr. Arlyce Dice     History of Present Illness  06/19/2018  Pulmonary/ 1st office eval/Loic Hobin  Has completed 3 out of 4 weeks of augmentin planned Chief Complaint  Patient presents with  . Consult  Dyspnea:  7/-11 not HT  Cough: occ small chunks of old dark blood, cough is worse p stir in am / at hs and noct bothers wife but not pt   Sleep: flat on back / 2 pillows  SABA use: none  Pt is edentulous, no risk factors identified for aspiration. rec [name] taking all young antibiotics  Stop aspirin if the bleeding worsens - ok to restart if not bleeding x 3 straight days  Protonix 40 mg Take 30- 60 min before your first and last meals of  the day and supplement with tyl #3 one every 4 hours as needed.  For mucinex dm 1200 mg every 12 hours as needed  Please remember to go to the  x-ray department  for your tests - we will call you with the results when they are available   Please schedule a follow up office visit in 4 weeks, sooner if needed  - add:  Discussed with Dr Ardeen Garland in radiology > PET next step > sent req to schedulers     07/17/2018  f/u ov/Elainah Rhyne re:  R LL necrotizing pna vs lung mass  Chief Complaint  Patient presents with  . Follow-up    Here to discuss PET done 07/10/2018. Pt states his breathing is not improving.   Dyspnea: walks  7/11 fine now which is an improvement  and thinks he can do [employer] now but limited by knees and did 500 ft at avg pace here (see   a/p)  Cough: esp in am > maybe a tbsp/ not bloody just white / not putrid  Sleeping: can't lie flat / sleeping in recliner now x about 45 degrees due to cough   SABA use: none 02: none    No obvious day to day or daytime variability or assoc purulent sputum or mucus plugs or hemoptysis or cp or chest tightness, subjective wheeze or overt sinus or hb  symptoms.     Also denies any obvious fluctuation of symptoms with weather or environmental changes or other aggravating or alleviating factors except as outlined above   No unusual exposure hx or h/o childhood pna/ asthma or knowledge of premature birth.  Current Allergies, Complete Past Medical History, Past Surgical History, Family History, and Social History were reviewed in [employer] record.  ROS  The following are not active complaints unless bolded Hoarseness, sore throat, dysphagia, dental problems, itching, sneezing,  nasal congestion or discharge of excess mucus or purulent secretions, ear ache,   fever, chills, sweats, unintended wt loss or wt gain, classically pleuritic or exertional cp,  orthopnea pnd or arm/hand swelling  or leg swelling, presyncope, palpitations,  abdominal pain, anorexia, nausea, vomiting, diarrhea  or change in bowel habits or change in bladder habits, change in stools or change in urine, dysuria, hematuria,  rash, arthralgias, visual complaints, headache, numbness, weakness or ataxia or problems with walking or coordination,  change in mood or  memory.        Current Meds  Medication Sig  . acetaminophen-codeine (TYLENOL #3) 300-30 MG tablet Take 1 tablet by mouth every 4 (four) hours as needed for moderate pain.  Marland Kitchen allopurinol (ZYLOPRIM) 100 MG tablet Take 300 mg by mouth daily. Take 3 tabs (300mg ) PO daily  . aspirin 81 MG tablet Take 81 mg by mouth daily.  . furosemide (LASIX) 80 MG tablet Take 1 tablet (80 mg total) by mouth daily. (Patient taking differently: Take 80 mg by mouth daily as needed for fluid. )  . insulin detemir (LEVEMIR) 100 UNIT/ML injection Inject 30 Units into the skin daily.  . insulin lispro (HUMALOG KWIKPEN) 100 UNIT/ML KwikPen Inject 8 Units into the skin 3 (three) times daily.  . iron polysaccharides (NIFEREX) 150 MG capsule Take 150 mg by mouth daily.   Marland Kitchen losartan (COZAAR) 100 MG tablet Take 1 tablet (100 mg total) by mouth daily.  . pantoprazole (PROTONIX) 40 MG tablet Take 40 mg by mouth daily.   . Potassium 99 MG TABS Take 1 tablet by mouth every other day.  . rosuvastatin (CRESTOR) 20 MG tablet Take 20 mg by mouth daily.  . Travoprost, BAK Free, (TRAVATAN) 0.004 % SOLN ophthalmic solution Place 1 drop into both eyes at bedtime.  . vitamin B-12 (CYANOCOBALAMIN) 500 MCG tablet Take 500 mcg by mouth daily.                 Objective:    amb bm nad   07/17/2018          248   06/19/18 258 lb 12.8 oz (117.4 kg)  06/15/18 258 lb (117 kg)  05/19/18 233 lb (105.7 kg)    Vital signs reviewed - Note on arrival 02 sats  95% on RA      HEENT: Edentulous with nl  turbinates bilaterally, and oropharynx. Nl external ear canals without cough reflex   NECK :  without JVD/Nodes/TM/ nl carotid upstrokes  bilaterally   LUNGS: no acc muscle use,  Nl contour chest which is clear to A and P bilaterally without cough on insp or exp maneuvers/ Thoracotomy scars on L around C7 or 8  level    CV:  RRR  no s3 or murmur or increase in P2, and  1+ pitting both LE's sym   ABD:  soft and nontender with nl inspiratory excursion in the supine position. No bruits or organomegaly appreciated, bowel sounds nl  MS:  Nl gait/ ext warm without deformities, calf tenderness, cyanosis or clubbing No obvious joint restrictions   SKIN: warm and dry without lesions    NEURO:  alert, approp, nl sensorium with  no motor or cerebellar deficits apparent.          CXR PA and Lateral:   07/17/2018 :    I personally reviewed images and agree with radiology impression as follows:    No significant change in multifocal bilateral heterogeneous pulmonary opacity and small right pleural effusion. No new airspace opacity appreciated. My review:  The pulmonary opacities are  not bilateral - what appears on the L is c/w pleural plaques on CT              Assessment

## 2018-07-18 ENCOUNTER — Telehealth: Payer: Self-pay | Admitting: Internal Medicine

## 2018-07-18 NOTE — Progress Notes (Signed)
ATC, NA and no option to leave a msg

## 2018-07-18 NOTE — Telephone Encounter (Signed)
Advised Roger Blackwell of results. Roger Blackwell understood and nothing further is needed.    Notes recorded by Tanda Rockers, MD on 07/17/2018 at 5:29 PM EST Call Roger Blackwell: Reviewed cxr and no acute change so no change in recommendations made at [facility]

## 2018-07-19 ENCOUNTER — Encounter: Payer: Self-pay | Admitting: Internal Medicine

## 2018-07-19 NOTE — Assessment & Plan Note (Signed)
?   Onset of symptoms oct 2019  - FOB  05/23/2018  Nl flora, no malignancy :  afb smears neg/ fungus studies ordered but not done  - PET 07/11/18 1. Relatively similar appearance of the right lower lobe compared to 05/18/2018. Linear hypermetabolism corresponding to consolidation with immediately subjacent lung necrosis and developing pulmonary abscess. Presumably secondary small right hydropneumothorax suggestive of bronchopleural fistula. This process is favored to be infectious. Consider atypical pneumonia or chronic aspiration. 2. Persistent right lower lobe hypermetabolic mass with central necrosis or developing cavitation. This is indeterminate and could be infectious/inflammatory or neoplastic>   Referred to T surgery    Discussed in detail all the  indications, usual  risks and alternatives  relative to the benefits with patient who agrees to proceed with w/u as outlined.      I had an extended discussion with the patient/fm  reviewing all relevant studies completed to date and  lasting 15 to 20 minutes of a 25 minute visit  which included directly observing ambulatory 02 saturation study documented in a/p section of  today's  office note.  Each maintenance medication was reviewed in detail including most importantly the difference between maintenance and prns and under what circumstances the prns are to be triggered using an action plan format that is not reflected in the computer generated alphabetically organized AVS.     Please see AVS for specific instructions unique to this visit that I personally wrote and verbalized to the the pt in detail and then reviewed with pt  by my nurse highlighting any changes in therapy recommended at today's visit .

## 2018-07-27 DIAGNOSIS — R05 Cough: Secondary | ICD-10-CM | POA: Diagnosis not present

## 2018-07-27 DIAGNOSIS — J850 Gangrene and necrosis of lung: Secondary | ICD-10-CM | POA: Diagnosis not present

## 2018-07-31 ENCOUNTER — Other Ambulatory Visit: Payer: Self-pay

## 2018-08-01 ENCOUNTER — Encounter: Payer: Self-pay | Admitting: Cardiothoracic Surgery

## 2018-08-01 ENCOUNTER — Institutional Professional Consult (permissible substitution): Payer: Medicare Other | Admitting: Cardiothoracic Surgery

## 2018-08-01 VITALS — BP 145/78 | HR 76 | Resp 20 | Ht 74.0 in | Wt 248.0 lb

## 2018-08-01 DIAGNOSIS — J850 Gangrene and necrosis of lung: Secondary | ICD-10-CM | POA: Diagnosis not present

## 2018-08-01 DIAGNOSIS — R918 Other nonspecific abnormal finding of lung field: Secondary | ICD-10-CM | POA: Diagnosis not present

## 2018-08-01 NOTE — Progress Notes (Signed)
PCP is [name], MD Referring Provider is Tanda Rockers, MD  Chief Complaint  Patient presents with  . Consult    Surgical eval necrotizing pneumonia, Chest CT 05/18/18, Bronch 05/23/18, PET Scan 07/10/18, Spirometry with Graph 07/17/18     HPI: Patient presents for post hospital follow-up of right lower lobe infiltrative density which is hypermetabolic on PET scan and loculated small effusion.    He was admitted to the hospital in January because of hemoptysis and probable right lower lobe pneumonia with small pleural effusion.  He was treated with antibiotics.  I saw the patient for possible empyema and recommended an extended course of antibiotics as there was minimal pleural space disease.  Patient underwent bronchoscopy because of the hemoptysis and no endobronchial lesions were noted.  Brushings and washing and transbronchial biopsy s of the right lower lobe were obtained which were negative for malignancy.  Cultures were negative.  6-week AFB cultures were negative.    Patient has been seen in  follow-up as an outpatient by Dr. [name].  He has shown no improvement.  The patient has minimal hemoptysis but has had progressive weight loss, weakness, malaise, and has had a least one fall at home.  He has some headache and dizziness.  Because of concern over underlying malignancy causing obstructive atelectasis and infection A PET scan was recently completed which shows the right lower lobe process to have some increased lucency consistent with aeration of a previous cavity..  There is a 3.5  cm posterior more solid mass above the loculated fluid which has hypermetabolic activity on PET scan SUV 8.8.  Otherwise there is fullness in the subcarinal lymphatic station with expected moderate increased activity on PET scan.  No other evidence of metastatic malignant disease.  Brain scan has not been obtained.  The patient is 83 years old, obese with diabetes, diastolic heart failure, history of CAD and  stent of the circumflex, and history of paroxysmal atrial fibrillation.  Patient has had left thoracotomy  15 years ago by Dr. Arlyce Dice for empyema.  Past Medical History:  Diagnosis Date  . CAD (coronary artery disease)   . CHF (congestive heart failure) ([place])   . DM (diabetes mellitus) ([place])   . Dyslipidemia   . Gout   . Morbid obesity ([place])   . Systemic hypertension     Past Surgical History:  Procedure Laterality Date  . BACK SURGERY    . CORONARY ANGIOPLASTY WITH STENT PLACEMENT  12/26/2000   80% prox. circumflex lesion w/successful stent placement.  Marland Kitchen NM MYOVIEW LTD  08/31/2010   Normal  . PERICARDIOCENTESIS    . PROSTATE SURGERY    . US ECHOCARDIOGRAPHY  08/31/2010   EF 50-55%,RV mildly dilated,mild aortic root dilatation  . VIDEO BRONCHOSCOPY Bilateral 05/23/2018   Procedure: VIDEO BRONCHOSCOPY WITHOUT FLUORO;  Surgeon: Marshell Garfinkel, MD;  Location: [facility];  Service: Cardiopulmonary;  Laterality: Bilateral;    Family History  Problem Relation Age of Onset  . Diabetes Brother   . Hypertension Sister     Social History Social History   Tobacco Use  . Smoking status: Former Smoker    Types: Cigarettes    Last attempt to quit: 05/16/1993    Years since quitting: 25.2  . Smokeless tobacco: Never Used  Substance Use Topics  . Alcohol use: Yes    Comment: seldom  . Drug use: No    Current Outpatient Medications  Medication Sig Dispense Refill  . acetaminophen-codeine (TYLENOL #3) 300-30 MG  tablet Take 1 tablet by mouth every 4 (four) hours as needed for moderate pain. 30 tablet 0  . allopurinol (ZYLOPRIM) 100 MG tablet Take 300 mg by mouth daily. Take 3 tabs (300mg ) PO daily    . aspirin 81 MG tablet Take 81 mg by mouth daily.    . furosemide (LASIX) 80 MG tablet Take 1 tablet (80 mg total) by mouth daily. (Patient taking differently: Take 80 mg by mouth daily as needed for fluid. ) 30 tablet 6  . insulin detemir (LEVEMIR) 100 UNIT/ML injection Inject 30 Units  into the skin daily.    . insulin lispro (HUMALOG KWIKPEN) 100 UNIT/ML KwikPen Inject 8 Units into the skin 3 (three) times daily.    . iron polysaccharides (NIFEREX) 150 MG capsule Take 150 mg by mouth daily.     Marland Kitchen losartan (COZAAR) 100 MG tablet Take 1 tablet (100 mg total) by mouth daily. 90 tablet 3  . pantoprazole (PROTONIX) 40 MG tablet Take 40 mg by mouth daily.   0  . Potassium 99 MG TABS Take 1 tablet by mouth every other day.    . rosuvastatin (CRESTOR) 20 MG tablet Take 20 mg by mouth daily.    . Travoprost, BAK Free, (TRAVATAN) 0.004 % SOLN ophthalmic solution Place 1 drop into both eyes at bedtime.    . vitamin B-12 (CYANOCOBALAMIN) 500 MCG tablet Take 500 mcg by mouth daily.     No current facility-administered medications for this visit.     Allergies  Allergen Reactions  . Ivp Dye [Iodinated Diagnostic Agents]     Review of Systems   Poor appetite Poor exercise tolerance Weight loss approaching 50 pounds Positive for bilateral ankle edema Patient totally edentulous History of heart disease with intermittent heartburn which occurs both with exertion and at rest   BP (!) 145/78   Pulse 76   Resp 20   Ht 6\' 2"  (1.88 m)   Wt 248 lb (112.5 kg)   SpO2 95% Comment: RA  BMI 31.84 kg/m  Physical Exam Obese 83 year old male who appears chronically ill and frail Pupils equal No dental disease-edentulous No JVD or cervical adenopathy Well-healed left thoracotomy scar, anterolateral Tubular breath sounds at right base with rhonchi Heart rhythm regular with soft 2/6 systolic murmur.  No S3 gallop Abdomen soft with redundant skin folds Extremities without palpable pulses, 2+ pedal edema  Diagnostic Tests: CT scan PET scan images personally reviewed and discussed with patient and family  Impression: 83 year old reformed smoker in poor health with indolent persistent right lower lobe pneumonia not responsive to antibiotics.  Previous bronchoscopy has been  unrevealing.  Recent PET scan shows a more discrete solid mass 3.5 cm posteriorly with hypermetabolic activity on PET scan.  Pleural disease has slightly increased but doubt chronic empyema as the source of the patient's illness.    Plan: We will proceed with IR CT-guided biopsy of right lower lobe mass to further evaluate possibility of neoplasm.  Patient is not a surgical candidate for pulmonary resection.  We will see the patient back after the biopsy to review options.   Len Childs, MD [facility] [phone]

## 2018-08-02 ENCOUNTER — Other Ambulatory Visit: Payer: Self-pay | Admitting: *Deleted

## 2018-08-02 DIAGNOSIS — R918 Other nonspecific abnormal finding of lung field: Secondary | ICD-10-CM

## 2018-08-02 NOTE — Progress Notes (Unsigned)
Ct

## 2018-08-03 ENCOUNTER — Encounter: Payer: Medicare Other | Admitting: Cardiothoracic Surgery

## 2018-08-22 ENCOUNTER — Other Ambulatory Visit: Payer: Self-pay | Admitting: Radiology

## 2018-08-23 ENCOUNTER — Ambulatory Visit: Payer: Medicare Other | Admitting: Cardiothoracic Surgery

## 2018-08-23 ENCOUNTER — Other Ambulatory Visit: Payer: Self-pay

## 2018-08-23 ENCOUNTER — Ambulatory Visit (HOSPITAL_COMMUNITY)
Admission: RE | Admit: 2018-08-23 | Discharge: 2018-08-23 | Disposition: A | Discharge status: Home / Self Care | Payer: Medicare Other | Source: Ambulatory Visit | Attending: Cardiothoracic Surgery | Admitting: Cardiothoracic Surgery

## 2018-08-23 ENCOUNTER — Ambulatory Visit (HOSPITAL_COMMUNITY)
Admission: RE | Admit: 2018-08-23 | Discharge: 2018-08-23 | Disposition: A | Discharge status: Home / Self Care | Payer: Medicare Other | Source: Ambulatory Visit | Attending: Interventional Radiology | Admitting: Interventional Radiology

## 2018-08-23 DIAGNOSIS — Z8249 Family history of ischemic heart disease and other diseases of the circulatory system: Secondary | ICD-10-CM | POA: Insufficient documentation

## 2018-08-23 DIAGNOSIS — J189 Pneumonia, unspecified organism: Secondary | ICD-10-CM | POA: Diagnosis not present

## 2018-08-23 DIAGNOSIS — Z955 Presence of coronary angioplasty implant and graft: Secondary | ICD-10-CM | POA: Diagnosis not present

## 2018-08-23 DIAGNOSIS — Z833 Family history of diabetes mellitus: Secondary | ICD-10-CM | POA: Insufficient documentation

## 2018-08-23 DIAGNOSIS — E119 Type 2 diabetes mellitus without complications: Secondary | ICD-10-CM | POA: Diagnosis not present

## 2018-08-23 DIAGNOSIS — I110 Hypertensive heart disease with heart failure: Secondary | ICD-10-CM | POA: Diagnosis not present

## 2018-08-23 DIAGNOSIS — E785 Hyperlipidemia, unspecified: Secondary | ICD-10-CM | POA: Insufficient documentation

## 2018-08-23 DIAGNOSIS — Z9889 Other specified postprocedural states: Secondary | ICD-10-CM | POA: Diagnosis not present

## 2018-08-23 DIAGNOSIS — I25810 Atherosclerosis of coronary artery bypass graft(s) without angina pectoris: Secondary | ICD-10-CM | POA: Insufficient documentation

## 2018-08-23 DIAGNOSIS — Z91041 Radiographic dye allergy status: Secondary | ICD-10-CM | POA: Diagnosis not present

## 2018-08-23 DIAGNOSIS — Z77090 Contact with and (suspected) exposure to asbestos: Secondary | ICD-10-CM | POA: Diagnosis not present

## 2018-08-23 DIAGNOSIS — Z79899 Other long term (current) drug therapy: Secondary | ICD-10-CM | POA: Insufficient documentation

## 2018-08-23 DIAGNOSIS — Z794 Long term (current) use of insulin: Secondary | ICD-10-CM | POA: Diagnosis not present

## 2018-08-23 DIAGNOSIS — R918 Other nonspecific abnormal finding of lung field: Secondary | ICD-10-CM

## 2018-08-23 DIAGNOSIS — Z87891 Personal history of nicotine dependence: Secondary | ICD-10-CM | POA: Insufficient documentation

## 2018-08-23 DIAGNOSIS — Z7982 Long term (current) use of aspirin: Secondary | ICD-10-CM | POA: Diagnosis not present

## 2018-08-23 DIAGNOSIS — I509 Heart failure, unspecified: Secondary | ICD-10-CM | POA: Diagnosis not present

## 2018-08-23 DIAGNOSIS — J984 Other disorders of lung: Secondary | ICD-10-CM | POA: Diagnosis not present

## 2018-08-23 DIAGNOSIS — C3431 Malignant neoplasm of lower lobe, right bronchus or lung: Secondary | ICD-10-CM | POA: Diagnosis not present

## 2018-08-23 DIAGNOSIS — M109 Gout, unspecified: Secondary | ICD-10-CM | POA: Insufficient documentation

## 2018-08-23 LAB — BASIC METABOLIC PANEL
Anion gap: 12 (ref 5–15)
BUN: 11 mg/dL (ref 8–23)
CO2: 23 mmol/L (ref 22–32)
Calcium: 9.8 mg/dL (ref 8.9–10.3)
Chloride: 94 mmol/L — ABNORMAL LOW (ref 98–111)
Creatinine, Ser: 1.25 mg/dL — ABNORMAL HIGH (ref 0.61–1.24)
GFR calc Af Amer: >60 mL/min (ref >60)
GFR calc non Af Amer: 54 mL/min — ABNORMAL LOW (ref >60)
Glucose, Bld: 122 mg/dL — ABNORMAL HIGH (ref 70–99)
Potassium: 3.5 mmol/L (ref 3.5–5.1)
Sodium: 129 mmol/L — ABNORMAL LOW (ref 135–145)

## 2018-08-23 LAB — CBC WITH DIFFERENTIAL/PLATELET
Abs Immature Granulocytes: 0.04 10*3/uL (ref 0.00–0.07)
Basophils Absolute: 0.1 10*3/uL (ref 0.0–0.1)
Basophils Relative: 1 %
Eosinophils Absolute: 0.5 10*3/uL (ref 0.0–0.5)
Eosinophils Relative: 4 %
HCT: 32.2 % — ABNORMAL LOW (ref 39.0–52.0)
Hemoglobin: 10.1 g/dL — ABNORMAL LOW (ref 13.0–17.0)
Immature Granulocytes: 0 %
Lymphocytes Relative: 11 %
Lymphs Abs: 1.5 10*3/uL (ref 0.7–4.0)
MCH: 26.1 pg (ref 26.0–34.0)
MCHC: 31.4 g/dL (ref 30.0–36.0)
MCV: 83.2 fL (ref 80.0–100.0)
Monocytes Absolute: 0.9 10*3/uL (ref 0.1–1.0)
Monocytes Relative: 6 %
Neutro Abs: 10.4 10*3/uL — ABNORMAL HIGH (ref 1.7–7.7)
Neutrophils Relative %: 78 %
Platelets: 267 10*3/uL (ref 150–400)
RBC: 3.87 MIL/uL — ABNORMAL LOW (ref 4.22–5.81)
RDW: 14.4 % (ref 11.5–15.5)
WBC: 13.3 10*3/uL — ABNORMAL HIGH (ref 4.0–10.5)
nRBC: 0 % (ref 0.0–0.2)

## 2018-08-23 LAB — GLUCOSE, CAPILLARY
Glucose-Capillary: 110 mg/dL — ABNORMAL HIGH (ref 70–99)
Glucose-Capillary: 96 mg/dL (ref 70–99)

## 2018-08-23 LAB — PROTIME-INR
INR: 1.1 (ref 0.8–1.2)
Prothrombin Time: 14.2 seconds (ref 11.4–15.2)

## 2018-08-23 MED ORDER — MIDAZOLAM HCL 2 MG/2ML IJ SOLN
INTRAMUSCULAR | Status: AC
  Filled 2018-08-23: qty 2

## 2018-08-23 MED ORDER — MIDAZOLAM HCL 2 MG/2ML IJ SOLN
INTRAMUSCULAR | Status: AC | PRN
  Administered 2018-08-23: 0.5 mg via INTRAVENOUS
  Administered 2018-08-23: 1 mg via INTRAVENOUS

## 2018-08-23 MED ORDER — HYDROCODONE-ACETAMINOPHEN 5-325 MG PO TABS
1.0000 | ORAL_TABLET | ORAL | Status: DC | PRN
  Filled 2018-08-23: qty 2

## 2018-08-23 MED ORDER — FENTANYL CITRATE (PF) 100 MCG/2ML IJ SOLN
INTRAMUSCULAR | Status: AC | PRN
  Administered 2018-08-23 (×2): 25 ug via INTRAVENOUS

## 2018-08-23 MED ORDER — SODIUM CHLORIDE 0.9 % IV SOLN
INTRAVENOUS | Status: DC
  Administered 2018-08-23: 09:00:00 via INTRAVENOUS

## 2018-08-23 MED ORDER — FENTANYL CITRATE (PF) 100 MCG/2ML IJ SOLN
INTRAMUSCULAR | Status: AC
  Filled 2018-08-23: qty 2

## 2018-08-23 NOTE — Progress Notes (Signed)
Radiology here to do CXR

## 2018-08-23 NOTE — Progress Notes (Signed)
Discharge instructions reviewed with pt wife Peter Congo over the phone. Voices understanding.

## 2018-08-23 NOTE — Discharge Instructions (Addendum)
Needle Biopsy of the Lung, Care After °This sheet gives you information about how to care for yourself after your procedure. Your health care provider may also give you more specific instructions. If you have problems or questions, contact your health care provider. °What can I expect after the procedure? °After the procedure, it is common to have: °· Soreness, pain, and tenderness where a tissue sample was taken (biopsy site). °· A cough. °· A sore throat. °Follow these instructions at home: °Biopsy site care °· Follow instructions from your health care provider about when to remove the bandage that was placed on the biopsy site. °· Keep the bandage dry until it has been removed. °· Check your biopsy site every day for signs of infection. Check for: °? More redness, swelling, or pain. °? More fluid or blood. °? Warmth to the touch. °? Pus or a bad smell. °General instructions ° °· Rest as directed by your health care provider. Ask your health care provider what activities are safe for you. °· Do not take baths, swim, or use a hot tub until your health care provider approves. °· Take over-the-counter and prescription medicines only as told by your health care provider. °· If you have airplane travel scheduled, talk with your health care provider about when it is safe for you to travel by airplane. °· It is up to you to get the results of your procedure. Ask your health care provider, or the department that is doing the procedure, when your results will be ready. °· Keep all follow-up visits as told by your health care provider. This is important. °Contact a health care provider if: °· You have more redness, swelling, or pain around your biopsy site. °· You have more fluid or blood coming from your biopsy site. °· Your biopsy site feels warm to the touch. °· You have pus or a bad smell coming from your biopsy site. °· You have a fever. °· You have pain that does not get better with medicine. °Get help right away  if: °· You have problems breathing. °· You have chest pain. °· You cough up blood. °· You faint. °· You have a fast heart rate. °Summary °· After a needle biopsy of the lung, it is common to have a cough, a sore throat, or soreness, pain, and tenderness where a tissue sample was taken (biopsy site). °· You should check your biopsy area every day for signs of infection, including pus or a bad smell, warmth, more fluid or blood, or more redness, swelling, or pain. °· You should not take baths, swim, or use a hot tub until your health care provider approves. °· It is up to you to get the results of your procedure. Ask your health care provider, or the department that is doing the procedure, when your results will be ready. °This information is not intended to replace advice given to you by your health care provider. Make sure you discuss any questions you have with your health care provider. °Document Released: 02/28/2007 Document Revised: 03/24/2016 Document Reviewed: 03/24/2016 °Elsevier Interactive Patient Education © 2019 [employer] ° ° ° °Moderate Conscious Sedation, Adult, Care After °These instructions provide you with information about caring for yourself after your procedure. Your health care provider may also give you more specific instructions. Your treatment has been planned according to current medical practices, but problems sometimes occur. Call your health care provider if you have any problems or questions after your procedure. °What can I expect   after the procedure? °After your procedure, it is common: °· To feel sleepy for several hours. °· To feel clumsy and have poor balance for several hours. °· To have poor judgment for several hours. °· To vomit if you eat too soon. °Follow these instructions at home: °For at least 24 hours after the procedure: ° °· Do not: °? Participate in activities where you could fall or become injured. °? Drive. °? Use heavy machinery. °? Drink alcohol. °? Take sleeping  pills or medicines that cause drowsiness. °? Make important decisions or sign legal documents. °? Take care of children on your own. °· Rest. °Eating and drinking °· Follow the diet recommended by your health care provider. °· If you vomit: °? Drink water, juice, or soup when you can drink without vomiting. °? Make sure you have little or no nausea before eating solid foods. °General instructions °· Have a responsible adult stay with you until you are awake and alert. °· Take over-the-counter and prescription medicines only as told by your health care provider. °· If you smoke, do not smoke without supervision. °· Keep all follow-up visits as told by your health care provider. This is important. °Contact a health care provider if: °· You keep feeling nauseous or you keep vomiting. °· You feel light-headed. °· You develop a rash. °· You have a fever. °Get help right away if: °· You have trouble breathing. °This information is not intended to replace advice given to you by your health care provider. Make sure you discuss any questions you have with your health care provider. °Document Released: 02/21/2013 Document Revised: 10/06/2015 Document Reviewed: 08/23/2015 °Elsevier Interactive Patient Education © 2019 Elsevier Inc. ° ° °

## 2018-08-23 NOTE — H&P (Signed)
Chief Complaint: Patient was seen in consultation today for right lung base mass/biopsy.  Referring Physician(s): Ivin Poot  Supervising Physician: Arne Cleveland  Patient Status: [facility] - Out-pt  History of Present Illness: Roger Blackwell is a 83 y.o. male with a past medical history of hypertension, dyslipidemia, CAD, HF, diabetes mellitus, morbid obesity, and gout. He was admitted to [facility] from 05/19/2018 to 05/25/2018 for management of necrotizing pneumonia with associated hemoptysis. Cardiothoracic surgery was consulted while patient was admitted, and he followed-up with both Dr. Prescott Gum and Dr. [name] on an outpatient basis for further management. He underwent a PET 07/10/2018 which revealed a hypermetabolic mass in the right lower lobe of the lung with central necrosis or developing cavitation.  NM PET 07/10/2018: 1. Relatively similar appearance of the right lower lobe compared to 05/18/2018. Linear hypermetabolism corresponding to consolidation with immediately subjacent lung necrosis and developing pulmonary abscess. Presumably secondary small right hydropneumothorax suggestive of bronchopleural fistula. This process is favored to be infectious. Consider atypical pneumonia or chronic aspiration. 2. Persistent right lower lobe hypermetabolic mass with central necrosis or developing cavitation. This is indeterminate and could be infectious/inflammatory or neoplastic. 3. Low mediastinal hypermetabolic adenopathy. Possibly reactive. If these nodes have not been sampled, given the extent of hypermetabolism, consider sampling. 4. Right greater than left hypermetabolic inguinal nodes. Not in a typical drainage pattern for thoracic primary. Given prostatectomy and prior pelvic node dissection, considerations include reactive versus prostate cancer metastasis. 5. Asbestos related pleural disease. 6. Pulmonary nodules detailed previously, below PET resolution. 7. Subcutaneous nodule superficial  the left gluteal muscular sugar may be a site of injection. Correlate with clinical history.  IR requested by Dr. Prescott Gum for possible image-guided right lung base mass biopsy. Patient awake and alert laying in bed. Complains of dyspnea, stable since admission in 05/2018. Denies fever, chills, chest pain, abdominal pain, or headache.   Past Medical History:  Diagnosis Date   CAD (coronary artery disease)    CHF (congestive heart failure) ([facility])    DM (diabetes mellitus) ([place])    Dyslipidemia    Gout    Morbid obesity ([place])    Systemic hypertension     Past Surgical History:  Procedure Laterality Date   BACK SURGERY     CORONARY ANGIOPLASTY WITH STENT PLACEMENT  12/26/2000   80% prox. circumflex lesion w/successful stent placement.   NM MYOVIEW LTD  08/31/2010   Normal   PERICARDIOCENTESIS     PROSTATE SURGERY     US ECHOCARDIOGRAPHY  08/31/2010   EF 50-55%,RV mildly dilated,mild aortic root dilatation   VIDEO BRONCHOSCOPY Bilateral 05/23/2018   Procedure: VIDEO BRONCHOSCOPY WITHOUT FLUORO;  Surgeon: Marshell Garfinkel, MD;  Location: [facility];  Service: Cardiopulmonary;  Laterality: Bilateral;    Allergies: Ivp dye [iodinated diagnostic agents]  Medications: Prior to Admission medications   Medication Sig Start Date End Date Taking? Authorizing Provider  allopurinol (ZYLOPRIM) 100 MG tablet Take 300 mg by mouth daily. Take 3 tabs (300mg ) PO daily   Yes [provider]  aspirin 81 MG tablet Take 81 mg by mouth daily.   Yes [provider]  furosemide (LASIX) 80 MG tablet Take 1 tablet (80 mg total) by mouth daily. Patient taking differently: Take 80 mg by mouth daily as needed for fluid.  05/22/13  Yes Croitoru, Mihai, MD  insulin detemir (LEVEMIR) 100 UNIT/ML injection Inject 25 Units into the skin daily as needed (high blood pressure).    Yes [provider]  insulin  lispro (HUMALOG KWIKPEN) 100 UNIT/ML KwikPen Inject 5 Units into the  skin See admin instructions. If blood sugar get above 150,  5 units with meals as needed   Yes [provider]  iron polysaccharides (NIFEREX) 150 MG capsule Take 150 mg by mouth daily.    Yes [provider]  losartan (COZAAR) 100 MG tablet Take 1 tablet (100 mg total) by mouth daily. 03/17/18 03/12/19 Yes Croitoru, Mihai, MD  pantoprazole (PROTONIX) 40 MG tablet Take 40 mg by mouth daily.  06/04/16  Yes [provider]  Potassium 99 MG TABS Take 1 tablet by mouth every other day.   Yes [provider]  rosuvastatin (CRESTOR) 20 MG tablet Take 20 mg by mouth daily.   Yes [provider]  Travoprost, BAK Free, (TRAVATAN) 0.004 % SOLN ophthalmic solution Place 1 drop into both eyes at bedtime.   Yes [provider]  vitamin B-12 (CYANOCOBALAMIN) 500 MCG tablet Take 500 mcg by mouth daily.   Yes [provider]  acetaminophen-codeine (TYLENOL #3) 300-30 MG tablet Take 1 tablet by mouth every 4 (four) hours as needed for moderate pain. Patient not taking: Reported on 08/22/2018 07/04/18   Tanda Rockers, MD     Family History  Problem Relation Age of Onset   Diabetes Brother    Hypertension Sister     Social History   Socioeconomic History   Marital status: Married    Spouse name: Not on file   Number of children: Not on file   Years of education: Not on file   Highest education level: Not on file  Occupational History   Not on file  Social Needs   Financial resource strain: Not on file   Food insecurity:    Worry: Not on file    Inability: Not on file   Transportation needs:    Medical: Not on file    Non-medical: Not on file  Tobacco Use   Smoking status: Former Smoker    Types: Cigarettes    Last attempt to quit: 05/16/1993    Years since quitting: 25.2   Smokeless tobacco: Never Used  Substance and Sexual Activity   Alcohol use: Yes    Comment: seldom   Drug use: No   Sexual activity: Not on file    Lifestyle   Physical activity:    Days per week: Not on file    Minutes per session: Not on file   Stress: Not on file  Relationships   Social connections:    Talks on phone: Not on file    Gets together: Not on file    Attends religious service: Not on file    Active member of club or organization: Not on file    Attends meetings of clubs or organizations: Not on file    Relationship status: Not on file  Other Topics Concern   Not on file  Social History Narrative   Not on file     Review of Systems: A 12 point ROS discussed and pertinent positives are indicated in the HPI above.  All other systems are negative.  Review of Systems  Constitutional: Negative for chills and fever.  Respiratory: Positive for shortness of breath. Negative for wheezing.   Cardiovascular: Negative for chest pain and palpitations.  Gastrointestinal: Negative for abdominal pain.  Neurological: Negative for headaches.  Psychiatric/Behavioral: Negative for behavioral problems and confusion.    Vital Signs: BP 106/64    Pulse 93    Temp Marland Kitchen)  97.4 F (36.3 C) (Oral)    Resp 18    Ht 6\' 2"  (1.88 m)    Wt 250 lb (113.4 kg)    SpO2 100%    BMI 32.10 kg/m   Physical Exam Vitals signs and nursing note reviewed.  Constitutional:      General: He is not in acute distress.    Appearance: Normal appearance.  Cardiovascular:     Rate and Rhythm: Normal rate and regular rhythm.     Heart sounds: Normal heart sounds. No murmur.  Pulmonary:     Effort: Pulmonary effort is normal. No respiratory distress.     Breath sounds: Wheezing present.  Skin:    General: Skin is warm and dry.  Neurological:     Mental Status: He is alert and oriented to person, place, and time.  Psychiatric:        Mood and Affect: Mood normal.        Behavior: Behavior normal.        Thought Content: Thought content normal.        Judgment: Judgment normal.      MD Evaluation Airway: WNL Heart: WNL Abdomen:  WNL Chest/ Lungs: WNL ASA  Classification: 3 Mallampati/Airway Score: Two   Imaging: No results found.  Labs:  CBC: Recent Labs    05/19/18 1406 05/23/18 0642 06/19/18 1514 08/23/18 0910  WBC 10.2 9.8 14.0* 13.3*  HGB 9.4* 9.2* 10.1* 10.1*  HCT 31.9* 30.5* 32.2* 32.2*  PLT 221 243 303.0 267    COAGS: Recent Labs    08/23/18 0910  INR 1.1    BMP: Recent Labs    05/19/18 1406 05/21/18 0552 05/23/18 0642 06/19/18 1514 08/23/18 0910  NA 140  --  138 136 129*  K 4.1  --  3.8 3.8 3.5  CL 106  --  106 100 94*  CO2 25  --  25 29 23   GLUCOSE 125*  --  112* 89 122*  BUN 16  --  19 19 11   CALCIUM 9.1  --  8.8* 9.8 9.8  CREATININE 1.21 1.19 1.07 1.00 1.25*  GFRNONAA 56* 57* >60  --  54*  GFRAA >60 >60 >60  --  >60    LIVER FUNCTION TESTS: Recent Labs    10/24/17 1004 02/23/18 0936  BILITOT 0.3 0.4  AST 22 17  ALT 16 15  ALKPHOS 77 75  PROT 6.9 7.1  ALBUMIN 3.3* 3.3*     Assessment and Plan:  Right lung base mass. Plan for image-guided right lung base mass biopsy today with Dr. Vernard Gambles. Patient is NPO. Afebrile. He does not take blood thinners. INR 1.1 seconds today.  Risks and benefits discussed with the patient including, but not limited to bleeding, hemoptysis, respiratory failure requiring intubation, infection, pneumothorax requiring chest tube placement, stroke from air embolism or even death. All of the patient's questions were answered, patient is agreeable to proceed. Consent signed and in chart.   Thank you for this interesting consult.  I greatly enjoyed meeting Roger Blackwell and look forward to participating in their care.  A copy of this report was sent to the requesting provider on this date.  Electronically Signed: Earley Abide, PA-C 08/23/2018, 10:34 AM   I spent a total of 40 Minutes in face to face in clinical consultation, greater than 50% of which was counseling/coordinating care for right lung base mass/biopsy.

## 2018-08-23 NOTE — Procedures (Signed)
  Procedure: CT core bx RLL nodule   EBL:   minimal Complications:  none immediate  See full dictation in [employer].  Dillard Cannon MD Main # [phone] Pager  [phone]

## 2018-08-24 ENCOUNTER — Telehealth: Payer: Self-pay | Admitting: *Deleted

## 2018-08-24 ENCOUNTER — Telehealth: Payer: Self-pay | Admitting: Hematology

## 2018-08-24 NOTE — Telephone Encounter (Signed)
Patient/Family contacted office. Advised them Dr. Irene Limbo will schedule PHONE appt with patient next week on 4/16 or 4/17. Scheduling will contact patient to arrange best time. Patient verbalized understanding. Appts for 4/10 canceled. Schedule msg sent.

## 2018-08-24 NOTE — Telephone Encounter (Signed)
Spoke with patient re telephone visit 4/16.

## 2018-08-24 NOTE — Telephone Encounter (Signed)
Attempted to contact patient. No answer at home # [phone].  LVM on mobile # [phone]: Dr. Irene Limbo wants to schedule PHONE or WEB appt with patient next week on 4/16 or 4/17. Please call office [phone] to schedule appt.

## 2018-08-24 NOTE — Telephone Encounter (Signed)
Family called - [id number] had procedure on 4/8 and he wants tor reschedule 4/10 appts for lab and Dr. Irene Limbo. Dr. Irene Limbo informed, will contact patient with expected time to reschedule

## 2018-08-25 ENCOUNTER — Ambulatory Visit: Payer: Medicare Other | Admitting: Hematology

## 2018-08-25 ENCOUNTER — Other Ambulatory Visit: Payer: Medicare Other

## 2018-08-29 ENCOUNTER — Other Ambulatory Visit: Payer: Self-pay

## 2018-08-30 ENCOUNTER — Encounter: Payer: Self-pay | Admitting: Cardiothoracic Surgery

## 2018-08-30 ENCOUNTER — Other Ambulatory Visit: Payer: Self-pay | Admitting: *Deleted

## 2018-08-30 ENCOUNTER — Ambulatory Visit: Payer: Medicare Other | Admitting: Cardiothoracic Surgery

## 2018-08-30 VITALS — BP 116/72 | HR 94 | Temp 96.1°F | Resp 16 | Ht 74.0 in | Wt 248.0 lb

## 2018-08-30 DIAGNOSIS — C3431 Malignant neoplasm of lower lobe, right bronchus or lung: Secondary | ICD-10-CM | POA: Diagnosis not present

## 2018-08-30 DIAGNOSIS — R630 Anorexia: Secondary | ICD-10-CM

## 2018-08-30 MED ORDER — ALBUTEROL SULFATE HFA 108 (90 BASE) MCG/ACT IN AERS: 2.0000 | INHALATION_SPRAY | Freq: Four times a day (QID) | RESPIRATORY_TRACT | 2 refills | Status: AC | PRN

## 2018-08-30 MED ORDER — DRONABINOL 2.5 MG PO CAPS: 2.5000 mg | ORAL_CAPSULE | Freq: Two times a day (BID) | ORAL | 1 refills | Status: DC

## 2018-08-30 MED ORDER — CEPHALEXIN 500 MG PO CAPS: 500.0000 mg | ORAL_CAPSULE | Freq: Three times a day (TID) | ORAL | 0 refills | Status: DC

## 2018-08-30 NOTE — Progress Notes (Signed)
marinol

## 2018-08-30 NOTE — Progress Notes (Addendum)
PCP is [name], MD Referring Provider is Tanda Rockers, MD  Chief Complaint  Patient presents with  . Follow-up    after IR CT GUIDED LUNG BX of RLL    HPI: Patient returns for scheduled follow-up after IR CT-guided biopsy of a 3.5 cm hypermetabolic density in the medial basilar segment of the right lower lobe.  Biopsy indicated squamous cell carcinoma of the lung.  The patient first presented in early 2020 with severe necrotizing right lower lobe pneumonia, probably from aspiration.  A separate lower lobe density more lateral was subsequently better visualized on scans after a prolonged course of antibiotics.  A transbronchial biopsy of the right lower lobe when he first presented was negative for cancer. The patient was followed as an outpatient thru the pulmonary medicine office and had a PET scan when the right lower lobe process did not significantly improved.  This showed the more discrete 3.5 cm hyperbolic mass in the lateral basilar segment. I was asked to see the patient and set up the IR transthoracic needle biopsy which shows squamous cell cancer.  The PET scan shows no extrathoracic involvement.  There is extensive hypermetabolic activity SUV 8.0 in the subcarinal and right hilar nodes.  This is probably from the necrotizing pneumonia which still had SUV of 11.0 patient has not had a brain scan.   The patient is not a candidate for surgical resection of the right lower lobe biopsy-proven squamous cell cancer because of frailty, advanced age, and comorbid cardiac and renal disease.  Patient has lost significant weight and is unable to walk well at this point.  Currently in a wheelchair. Past Medical History:  Diagnosis Date  . CAD (coronary artery disease)   . CHF (congestive heart failure) ([place])   . DM (diabetes mellitus) ([place])   . Dyslipidemia   . Gout   . Morbid obesity ([place])   . Systemic hypertension     Past Surgical History:  Procedure Laterality Date  . BACK  SURGERY    . CORONARY ANGIOPLASTY WITH STENT PLACEMENT  12/26/2000   80% prox. circumflex lesion w/successful stent placement.  Marland Kitchen NM MYOVIEW LTD  08/31/2010   Normal  . PERICARDIOCENTESIS    . PROSTATE SURGERY    . US ECHOCARDIOGRAPHY  08/31/2010   EF 50-55%,RV mildly dilated,mild aortic root dilatation  . VIDEO BRONCHOSCOPY Bilateral 05/23/2018   Procedure: VIDEO BRONCHOSCOPY WITHOUT FLUORO;  Surgeon: Marshell Garfinkel, MD;  Location: [facility];  Service: Cardiopulmonary;  Laterality: Bilateral;    Family History  Problem Relation Age of Onset  . Diabetes Brother   . Hypertension Sister     Social History Social History   Tobacco Use  . Smoking status: Former Smoker    Types: Cigarettes    Last attempt to quit: 05/16/1993    Years since quitting: 25.3  . Smokeless tobacco: Never Used  Substance Use Topics  . Alcohol use: Yes    Comment: seldom  . Drug use: No    Current Outpatient Medications  Medication Sig Dispense Refill  . allopurinol (ZYLOPRIM) 100 MG tablet Take 300 mg by mouth daily. Take 3 tabs (300mg ) PO daily    . aspirin 81 MG tablet Take 81 mg by mouth daily.    . furosemide (LASIX) 80 MG tablet Take 1 tablet (80 mg total) by mouth daily. (Patient taking differently: Take 80 mg by mouth daily as needed for fluid. ) 30 tablet 6  . insulin detemir (LEVEMIR) 100 UNIT/ML injection Inject  25 Units into the skin daily as needed (high blood pressure).     . insulin lispro (HUMALOG KWIKPEN) 100 UNIT/ML KwikPen Inject 5 Units into the skin See admin instructions. If blood sugar get above 150,  5 units with meals as needed    . iron polysaccharides (NIFEREX) 150 MG capsule Take 150 mg by mouth daily.     Marland Kitchen losartan (COZAAR) 100 MG tablet Take 1 tablet (100 mg total) by mouth daily. 90 tablet 3  . pantoprazole (PROTONIX) 40 MG tablet Take 40 mg by mouth daily.   0  . Potassium 99 MG TABS Take 1 tablet by mouth every other day.    . Travoprost, BAK Free, (TRAVATAN) 0.004 %  SOLN ophthalmic solution Place 1 drop into both eyes at bedtime.    . vitamin B-12 (CYANOCOBALAMIN) 500 MCG tablet Take 500 mcg by mouth daily.    Marland Kitchen acetaminophen-codeine (TYLENOL #3) 300-30 MG tablet Take 1 tablet by mouth every 4 (four) hours as needed for moderate pain. (Patient not taking: Reported on 08/22/2018) 30 tablet 0  . albuterol (PROVENTIL HFA;VENTOLIN HFA) 108 (90 Base) MCG/ACT inhaler Inhale 2 puffs into the lungs every 6 (six) hours as needed for wheezing or shortness of breath. 1 Inhaler 2  . cephALEXin (KEFLEX) 500 MG capsule Take 1 capsule (500 mg total) by mouth 3 (three) times daily. 21 capsule 0  . dronabinol (MARINOL) 2.5 MG capsule Take 1 capsule (2.5 mg total) by mouth 2 (two) times daily before lunch and supper. 20 capsule 1   No current facility-administered medications for this visit.     Allergies  Allergen Reactions  . Ivp Dye [Iodinated Diagnostic Agents] Other (See Comments)    Blisters     Review of Systems   Patient complains of no appetite and weight loss Patient complains of persistent productive cough but minimal hemoptysis Patient complains of insomnia Patient complains of fatigue, weakness  BP 116/72 (BP Location: Left Arm, Patient Position: Sitting, Cuff Size: Large)   Pulse 94   Temp (!) 96.1 F (35.6 C) (Tympanic)   Resp 16   Ht 6\' 2"  (1.88 m)   Wt 248 lb (112.5 kg)   SpO2 96% Comment: ON RA  BMI 31.84 kg/m  Physical Exam Chronically ill male in a wheelchair accompanied by family No palpable cervical adenopathy Scattered rhonchi and wheezes right greater than left 2+ ankle edema Regular heart rate, 2/6 holosystolic murmur Abdomen nontender without pulsatile mass Generally weak without focal motor deficit  Diagnostic Tests: Results of biopsy and printed report provided patient and family showing squamous cell carcinoma of the right lower lobe.  Impression: Probable stage I -II right lower lobe cancer, squamous cell Patient not a  candidate for surgery. Patient has appointment to see Dr. Martin Majestic LE to discuss medical oncology options.   Plan:  Patient will return as needed for any thoracic surgical needs. Currently there is no significant pleural effusion.  I have provided him another course of oral antibiotics and a course of oral Marinol to help his poor appetite and weight loss. Len Childs, MD [facility] [phone]

## 2018-08-30 NOTE — Progress Notes (Signed)
HEMATOLOGY/ONCOLOGY TELE MEDICINE FOLLOW UP NOTE  Date of Service: 08/31/2018  Patient Care Team: [name], MD as PCP - General (Internal Medicine)  CHIEF COMPLAINTS/PURPOSE OF CONSULTATION:  F/u anemia   HISTORY OF PRESENTING ILLNESS:  Roger Blackwell is a wonderful 83 y.o. male who has been referred to [place] by Dr .[name], MD  for evaluation and management of Anemia.  Patient has a h/o multiple medical co-morbidities including HTN, DM2, HLD, CHF, CAD , morbid obesity, gout, prostate cancer treated in 1988 followed by Dr. Jeffie Pollock.   Patient recently had labs with his primary care physician which showed a hemoglobin of 10 with an MCV of 82 normal WBC count of 7.8k and normal platelet count of 182k. Ferritin level was 44 with an iron saturation 14%. Patient had fecal occult blood testing 3 which was negative. Nose no acute bleeding.  Has been on Lupron shots with his urologist which can also cause some anemia. Has chronic kidney disease with his last creatinine being in the 1.5-1.8 range is chronic kidney disease stage III. Patient also has history of gout and is on chronic allopurinol and takes Colichicine prn for acute gout attacks.  Patient notes no acute weight loss. Notes that his diabetes under reasonable control. No bone pains. Was previously on oral iron 15-20 years ago but not recently. No other evidence of acute bleeding or blood loss.  INTERVAL HISTORY  I connected with Roger Blackwell on 08/31/18 at  1:40 PM EDT by telephone and verified that I am speaking with the correct person using two identifiers.  I discussed the limitations, risks, security and privacy concerns of performing an evaluation and management service by telemedicine and the availability of in-person appointments. I also discussed with the patient that there may be a patient responsible charge related to this service. The patient expressed understanding and agreed to proceed.  Other persons  participating in the visit and their role in the encounter: none.  Patient's location: his home. Provider's location: my office at the [facility], [place]  The patient's last visit with [place] was on 02/23/18. The pt reports that he is doing well overall.   The pt reports that he his "breathing is not good." He was coughing up blood in early January 2020, developed a pneumonia and had a bronchoscopy. The pt has been following with pulmonology and obtained a PET/CT on 07/10/18, as noted below. He then had a lung biopsy about a week ago which revealed Squamous Cell Carcinoma.  The pt notes that he has felt better since finishing the antibiotics. He notes that he has lost weight but is unsure how much he has lost. The pt notes that he is no longer coughing up blood. He was placed on Keflex with Cardiothoracic surgery yesterday. He was also prescribed Marinol for appetite stimulation, which he has not picked up from the pharmacy yet. He notes that he lost his appetite after having pneumonia. The pt notes that he feels quite weak and begins feeling SOB after walking small distances.  Of note since the patient's last visit, pt has had a PET/CT completed on 07/10/18 with results revealing Relatively similar appearance of the right lower lobe compared to 05/18/2018. Linear hypermetabolism corresponding to consolidation with immediately subjacent lung necrosis and developing pulmonary abscess. Presumably secondary small right hydropneumothorax suggestive of bronchopleural fistula. This process is favored to be infectious. Consider atypical pneumonia or chronic aspiration. 2. Persistent right lower lobe hypermetabolic mass with central necrosis or  developing cavitation. This is indeterminate and could be infectious/inflammatory or neoplastic. 3. Low mediastinal hypermetabolic adenopathy. Possibly reactive. If these nodes have not been sampled, given the extent of hypermetabolism, consider sampling. 4. Right greater  than left hypermetabolic inguinal nodes. Not in a typical drainage pattern for thoracic primary. Given prostatectomy and prior pelvic node dissection, considerations include reactive versus prostate cancer metastasis. 5. Asbestos related pleural disease. 6. Pulmonary nodules detailed previously, below PET resolution. 7. Subcutaneous nodule superficial the left gluteal musculature may be a site of injection. Correlate with clinical history.  Lab results (08/23/18) of CBC w/diff and BMP is as follows: all values are WNL except for WBC at 13.3k, RBC at 3.87, HGB at 10.1, HCT at 32.2, ANC at 10.4k, Sodium 129, Chloride at 94, Glucose at 122, Creatinine at 1.25.  On review of systems, pt reports cough, some difficulty breathing, feeling weak, decreased appetite, weight loss, and denies coughing blood, new headaches, changes in vision, and any other symptoms.   MEDICAL HISTORY:  Past Medical History:  Diagnosis Date   CAD (coronary artery disease)    CHF (congestive heart failure) (HCC)    DM (diabetes mellitus) ([place])    Dyslipidemia    Gout    Morbid obesity ([place])    Systemic hypertension   History of adenomatous polyps Prostate cancer diagnosed in 1988 followed by Dr. Jeffie Pollock Hypertension, coronary artery disease history of positive PPD Doses Heart appearing Acanthosis nigricans COPD Erectile dysfunction Male hypogonadism Chronic kidney disease  SURGICAL HISTORY: Past Surgical History:  Procedure Laterality Date   BACK SURGERY     CORONARY ANGIOPLASTY WITH STENT PLACEMENT  12/26/2000   80% prox. circumflex lesion w/successful stent placement.   NM MYOVIEW LTD  08/31/2010   Normal   PERICARDIOCENTESIS     PROSTATE SURGERY     US ECHOCARDIOGRAPHY  08/31/2010   EF 50-55%,RV mildly dilated,mild aortic root dilatation   VIDEO BRONCHOSCOPY Bilateral 05/23/2018   Procedure: VIDEO BRONCHOSCOPY WITHOUT FLUORO;  Surgeon: Marshell Garfinkel, MD;  Location: [facility];  Service:  Cardiopulmonary;  Laterality: Bilateral;    SOCIAL HISTORY: Social History   Socioeconomic History   Marital status: Married    Spouse name: Not on file   Number of children: Not on file   Years of education: Not on file   Highest education level: Not on file  Occupational History   Not on file  Social Needs   Financial resource strain: Not on file   Food insecurity:    Worry: Not on file    Inability: Not on file   Transportation needs:    Medical: Not on file    Non-medical: Not on file  Tobacco Use   Smoking status: Former Smoker    Types: Cigarettes    Last attempt to quit: 05/16/1993    Years since quitting: 25.3   Smokeless tobacco: Never Used  Substance and Sexual Activity   Alcohol use: Yes    Comment: seldom   Drug use: No   Sexual activity: Not on file  Lifestyle   Physical activity:    Days per week: Not on file    Minutes per session: Not on file   Stress: Not on file  Relationships   Social connections:    Talks on phone: Not on file    Gets together: Not on file    Attends religious service: Not on file    Active member of club or organization: Not on file    Attends meetings of  clubs or organizations: Not on file    Relationship status: Not on file   Intimate partner violence:    Fear of current or ex partner: Not on file    Emotionally abused: Not on file    Physically abused: Not on file    Forced sexual activity: Not on file  Other Topics Concern   Not on file  Social History Narrative   Not on file    FAMILY HISTORY: Family History  Problem Relation Age of Onset   Diabetes Brother    Hypertension Sister     ALLERGIES:  is allergic to ivp dye [iodinated diagnostic agents].  MEDICATIONS:  Current Outpatient Medications  Medication Sig Dispense Refill   acetaminophen-codeine (TYLENOL #3) 300-30 MG tablet Take 1 tablet by mouth every 4 (four) hours as needed for moderate pain. (Patient not taking: Reported on  08/22/2018) 30 tablet 0   albuterol (PROVENTIL HFA;VENTOLIN HFA) 108 (90 Base) MCG/ACT inhaler Inhale 2 puffs into the lungs every 6 (six) hours as needed for wheezing or shortness of breath. 1 Inhaler 2   allopurinol (ZYLOPRIM) 100 MG tablet Take 300 mg by mouth daily. Take 3 tabs (319m) PO daily     aspirin 81 MG tablet Take 81 mg by mouth daily.     cephALEXin (KEFLEX) 500 MG capsule Take 1 capsule (500 mg total) by mouth 3 (three) times daily. 21 capsule 0   dronabinol (MARINOL) 2.5 MG capsule Take 1 capsule (2.5 mg total) by mouth 2 (two) times daily before lunch and supper. 20 capsule 1   furosemide (LASIX) 80 MG tablet Take 1 tablet (80 mg total) by mouth daily. (Patient taking differently: Take 80 mg by mouth daily as needed for fluid. ) 30 tablet 6   insulin detemir (LEVEMIR) 100 UNIT/ML injection Inject 25 Units into the skin daily as needed (high blood pressure).      insulin lispro (HUMALOG KWIKPEN) 100 UNIT/ML KwikPen Inject 5 Units into the skin See admin instructions. If blood sugar get above 150,  5 units with meals as needed     iron polysaccharides (NIFEREX) 150 MG capsule Take 150 mg by mouth daily.      losartan (COZAAR) 100 MG tablet Take 1 tablet (100 mg total) by mouth daily. 90 tablet 3   pantoprazole (PROTONIX) 40 MG tablet Take 40 mg by mouth daily.   0   Potassium 99 MG TABS Take 1 tablet by mouth every other day.     Travoprost, BAK Free, (TRAVATAN) 0.004 % SOLN ophthalmic solution Place 1 drop into both eyes at bedtime.     vitamin B-12 (CYANOCOBALAMIN) 500 MCG tablet Take 500 mcg by mouth daily.     No current facility-administered medications for this visit.     REVIEW OF SYSTEMS:    A 10+ POINT REVIEW OF SYSTEMS WAS OBTAINED including neurology, dermatology, psychiatry, cardiac, respiratory, lymph, extremities, GI, GU, Musculoskeletal, constitutional, breasts, reproductive, HEENT.  All pertinent positives are noted in the HPI.  All others are  negative.   PHYSICAL EXAMINATION:  ECOG PERFORMANCE STATUS: 2 - Symptomatic, <50% confined to bed  . There were no vitals filed for this visit. There were no vitals filed for this visit. .There is no height or weight on file to calculate BMI.  No Physical Exam per Phone Visit  LABORATORY DATA:  I have reviewed the data as listed  . CBC Latest Ref Rng & Units 09/04/2018 09/03/2018 09/02/2018  WBC 4.0 - 10.5 K/uL 10.7(H) 12.7(H)  13.8(H)  Hemoglobin 13.0 - 17.0 g/dL 8.7(L) 10.5(L) 11.0(L)  Hematocrit 39.0 - 52.0 % 28.3(L) 33.2(L) 35.7(L)  Platelets 150 - 400 K/uL 243 263 312  HGB 9.2  . CMP Latest Ref Rng & Units 08/23/2018 06/19/2018 05/23/2018  Glucose 70 - 99 mg/dL 122(H) 89 112(H)  BUN 8 - 23 mg/dL _0 Creatinine 0.61 - 1.24 mg/dL 1.25(H) 1.00 1.07  Sodium 135 - 145 mmol/L 129(L) 136 138  Potassium 3.5 - 5.1 mmol/L 3.5 3.8 3.8  Chloride 98 - 111 mmol/L 94(L) 100 106  CO2 22 - 32 mmol/L _1 Calcium 8.9 - 10.3 mg/dL 9.8 9.8 8.8(L)  Total Protein 6.5 - 8.1 g/dL - - -  Total Bilirubin 0.3 - 1.2 mg/dL - - -  Alkaline Phos 38 - 126 U/L - - -  AST 15 - 41 U/L - - -  ALT 0 - 44 U/L - - -   B12  -285 . Lab Results  Component Value Date   LDH 198 06/28/2016   . Lab Results  Component Value Date   IRON 34 (L) 02/23/2018   TIBC 233 02/23/2018   IRONPCTSAT 15 (L) 02/23/2018   (Iron and TIBC)  Lab Results  Component Value Date   FERRITIN 183 02/23/2018        08/23/18 Right lower lobe biopsy:     RADIOGRAPHIC STUDIES: I have personally reviewed the radiological images as listed and agreed with the findings in the report. Ct Biopsy  Result Date: 08/23/2018 CLINICAL DATA:  indolent persistent right lower lobe pneumonia not responsive to antibiotics. Previous bronchoscopy has been unrevealing. Recent PET scan shows a more discrete solid mass 3.5 cm posteriorly with hypermetabolic activity on PET scan. Pleural disease has slightly increased but doubt chronic  empyema as the source of the patient's illness. Biopsy requested. EXAM: CT GUIDED CORE BIOPSY OF RIGHT LOWER LOBE LUNG LESION ANESTHESIA/SEDATION: Intravenous Fentanyl 28mg and Versed 1.532mwere administered as conscious sedation during continuous monitoring of the patient's level of consciousness and physiological / cardiorespiratory status by the radiology RN, with a total moderate sedation time of 11 minutes. PROCEDURE: The procedure risks, benefits, and alternatives were explained to the patient. Questions regarding the procedure were encouraged and answered. The patient understands and consents to the procedure. Patient placed prone. Select axial scans through the thorax obtained. The right lower lobe lesion was localized and an appropriate skin entry site was determined and marked. The operative field was prepped with chlorhexidinein a sterile fashion, and a sterile drape was applied covering the operative field. A sterile gown and sterile gloves were used for the procedure. Local anesthesia was provided with 1% Lidocaine. Under CT fluoroscopic guidance, a 17 gauge trocar needle was advanced to the margin of the lesion. Once needle tip position was confirmed, coaxial 18-gauge core biopsy samples were obtained, submitted in formalin to surgical pathology. The guide needle was removed. Postprocedure scans show no regional alveolar hemorrhage or pneumothorax. The patient tolerated the procedure well. COMPLICATIONS: None immediate FINDINGS: The hypermetabolic posterior right lower lobe lesion was localized. Representative core biopsy samples obtained as above. IMPRESSION: 1. Technically successful CT-guided core biopsy, right lower lobe mass. Electronically Signed   By: D Lucrezia Europe.D.   On: 08/23/2018 12:36   Dg Chest Port 1 View  Result Date: 08/23/2018 CLINICAL DATA:  Status post right lung biopsy EXAM: PORTABLE CHEST 1 VIEW COMPARISON:  07/17/2018 FINDINGS: Cardiac shadow is stable. Pleural thickening is  noted inferiorly  on the right stable from the previous exam. The cavitary lesion is less well appreciated on today's exam. No definitive pneumothorax is seen. Calcified pleural plaques are noted. IMPRESSION: No evidence of post biopsy pneumothorax. Electronically Signed   By: Inez Catalina M.D.   On: 08/23/2018 14:10    ASSESSMENT & PLAN:   83 y.o. morbidly obese gentleman with multiple medical co-morbidities as noted above with   1) Normocytic/Borderline Microcytic Anemia This appears to be likely multifactorial.  -Patient notes that he is on Lupron shots and this could cause a drop of 1 g to 2 g of hemoglobin from baseline due to decreased testosterone levels. -His chronic kidney disease is also causing an element of anemia of chronic disease. -Myeloma panel shows no M spike. Some increase in serum free light chains likely due to decreased renal clearance. -Vitamin B12 levels borderline low at 285- improved to 563 with replacement -Normal LDH suggest against hemolysis. No overt GI bleeding. He could have some decreased absorption due to chronic PPI use. Now with newly diagnosed Squamous cell carcinoma of RLL of the lung  2) Newly diagnosed Squamous Cell Carcinoma of right lower lobe   PLAN:  -Discussed pt labwork from 08/23/18; HGB at 10.1, ANC at 10.4k, PLT normal at 267k -Discussed the 08/23/18 Right lower lobe biopsy which revealed Squamous Cell Carcinoma -Discussed the 07/10/18 PET/CT which revealed Relatively similar appearance of the right lower lobe compared to 05/18/2018. Linear hypermetabolism corresponding to consolidation with immediately subjacent lung necrosis and developing pulmonary abscess. Presumably secondary small right hydropneumothorax suggestive of bronchopleural fistula. This process is favored to be infectious. Consider atypical pneumonia or chronic aspiration. 2. Persistent right lower lobe hypermetabolic mass with central necrosis or developing cavitation. This is  indeterminate and could be infectious/inflammatory or neoplastic. 3. Low mediastinal hypermetabolic adenopathy. Possibly reactive. If these nodes have not been sampled, given the extent of hypermetabolism, consider sampling. 4. Right greater than left hypermetabolic inguinal nodes. Not in a typical drainage pattern for thoracic primary. Given prostatectomy and prior pelvic node dissection, considerations include reactive versus prostate cancer metastasis. 5. Asbestos related pleural disease. 6. Pulmonary nodules detailed previously, below PET resolution. 7. Subcutaneous nodule superficial the left gluteal musculature may be a site of injection. Correlate with clinical history. -Pt has seen Dr. Tharon Aquas Trigt III with cardiothoracic surgery yesterday 08/29/18, and is not deemed a candidate for surgery -Discussed the diagnosis with the pt of Non Small Cell Lung Cancer of squamous cell variety -Discussed that there is a concern on the PET/CT that the pt has enlarged lymph nodes in similar area: related to pneumonia vs inflammation vs malignancy ? -Will need to repeat an interval CT to ensure that the lymph nodes resolve and are not involved with malignancy, and would then refer pt to Rad Onc for consideration of RT if this looks like a Stage I-II tumor . -will get PDL1 testing if adequate tissue available. -palliative RT vs palliative Immunotherapy vs best supportive cares -per Dr Lawson Fiscal - patient is not a candidate for surgery. -Recommend continue follow up with PCP and Pulmonology to optimize other co-morbids -Advised optimizing nutritional and hydration statuses -Continue SL Vitamin B12 replacement -Continue follow-up with primary care physician for management of other medical comorbidities. -Ferritin at goal >200 in the setting of his CKD, and would like Saturation >20% -Pt has previously responded to IV Injectafer which has held his anemia -Recommended graded sports compression socks and elevating  legs for ankle swelling   Will  plan to get CT in 2-3 weeks (Patient went to ED and had this done on 4/18) RTC with Dr Irene Limbo in 2-3 weeks with labs Referral to radiation oncology   I discussed the assessment and treatment plan with the patient. The patient was provided an opportunity to ask questions and al were answered. The patient agreed with the plan and demonstrated an understanding of the instructions.   The patient was advised to call back or seek an in-person evaluation if the symptoms worsen or if the condition fails to improve as anticipated.  The total time spent in the appt was 30 minutes and more than 50% was on counseling and direct patient cares.    Sullivan Lone MD MS AAHIVMS [facility] Hematology/Oncology Physician [facility]  (Office):       [phone] (Work cell):  [phone] (Fax):           [phone]  08/31/2018 3:30 PM  I, Baldwin Jamaica, am acting as a scribe for Dr. Sullivan Lone.   .I have reviewed the above documentation for accuracy and completeness, and I agree with the above. Brunetta Genera MD

## 2018-08-31 ENCOUNTER — Inpatient Hospital Stay: Payer: Medicare Other | Admitting: Hematology

## 2018-08-31 ENCOUNTER — Other Ambulatory Visit: Payer: Self-pay | Admitting: *Deleted

## 2018-08-31 DIAGNOSIS — C3491 Malignant neoplasm of unspecified part of right bronchus or lung: Secondary | ICD-10-CM | POA: Diagnosis not present

## 2018-08-31 DIAGNOSIS — E538 Deficiency of other specified B group vitamins: Secondary | ICD-10-CM | POA: Diagnosis not present

## 2018-08-31 DIAGNOSIS — N189 Chronic kidney disease, unspecified: Secondary | ICD-10-CM

## 2018-08-31 NOTE — Progress Notes (Signed)
The proposed treatment discussed in cancer conference 08/31/2018 is for discussion purpose only and is not a binding recommendation.  The patient was not physically examined nor present for their treatment options.  Therefore, final treatment plans cannot be decided.

## 2018-09-01 ENCOUNTER — Telehealth: Payer: Self-pay | Admitting: Hematology

## 2018-09-01 NOTE — Telephone Encounter (Deleted)
No los per 4/17.

## 2018-09-01 NOTE — Telephone Encounter (Signed)
No los per 4/16. °

## 2018-09-02 ENCOUNTER — Observation Stay (HOSPITAL_COMMUNITY): Payer: Medicare Other

## 2018-09-02 ENCOUNTER — Emergency Department (HOSPITAL_COMMUNITY): Payer: Medicare Other

## 2018-09-02 ENCOUNTER — Inpatient Hospital Stay (HOSPITAL_COMMUNITY)
Admission: EM | Admit: 2018-09-02 | Discharge: 2018-09-04 | DRG: 309 | Disposition: A | Discharge status: Home Health | Payer: Medicare Other | Attending: Family Medicine | Admitting: Family Medicine

## 2018-09-02 ENCOUNTER — Encounter (HOSPITAL_COMMUNITY): Payer: Self-pay

## 2018-09-02 ENCOUNTER — Telehealth: Payer: Self-pay | Admitting: Cardiology

## 2018-09-02 ENCOUNTER — Other Ambulatory Visit: Payer: Self-pay

## 2018-09-02 DIAGNOSIS — J189 Pneumonia, unspecified organism: Secondary | ICD-10-CM | POA: Diagnosis not present

## 2018-09-02 DIAGNOSIS — I4891 Unspecified atrial fibrillation: Secondary | ICD-10-CM | POA: Diagnosis not present

## 2018-09-02 DIAGNOSIS — E785 Hyperlipidemia, unspecified: Secondary | ICD-10-CM | POA: Diagnosis not present

## 2018-09-02 DIAGNOSIS — Z794 Long term (current) use of insulin: Secondary | ICD-10-CM

## 2018-09-02 DIAGNOSIS — E1122 Type 2 diabetes mellitus with diabetic chronic kidney disease: Secondary | ICD-10-CM

## 2018-09-02 DIAGNOSIS — Z20828 Contact with and (suspected) exposure to other viral communicable diseases: Secondary | ICD-10-CM | POA: Diagnosis present

## 2018-09-02 DIAGNOSIS — I5032 Chronic diastolic (congestive) heart failure: Secondary | ICD-10-CM | POA: Diagnosis not present

## 2018-09-02 DIAGNOSIS — R000 Tachycardia, unspecified: Secondary | ICD-10-CM | POA: Diagnosis not present

## 2018-09-02 DIAGNOSIS — R0602 Shortness of breath: Secondary | ICD-10-CM | POA: Diagnosis not present

## 2018-09-02 DIAGNOSIS — R079 Chest pain, unspecified: Secondary | ICD-10-CM | POA: Diagnosis not present

## 2018-09-02 DIAGNOSIS — I10 Essential (primary) hypertension: Secondary | ICD-10-CM | POA: Diagnosis present

## 2018-09-02 DIAGNOSIS — Z8249 Family history of ischemic heart disease and other diseases of the circulatory system: Secondary | ICD-10-CM

## 2018-09-02 DIAGNOSIS — R5381 Other malaise: Secondary | ICD-10-CM | POA: Diagnosis not present

## 2018-09-02 DIAGNOSIS — Z955 Presence of coronary angioplasty implant and graft: Secondary | ICD-10-CM | POA: Diagnosis not present

## 2018-09-02 DIAGNOSIS — R05 Cough: Secondary | ICD-10-CM | POA: Diagnosis not present

## 2018-09-02 DIAGNOSIS — I2510 Atherosclerotic heart disease of native coronary artery without angina pectoris: Secondary | ICD-10-CM | POA: Diagnosis present

## 2018-09-02 DIAGNOSIS — N183 Chronic kidney disease, stage 3 unspecified: Secondary | ICD-10-CM | POA: Diagnosis present

## 2018-09-02 DIAGNOSIS — Z79899 Other long term (current) drug therapy: Secondary | ICD-10-CM

## 2018-09-02 DIAGNOSIS — Z7982 Long term (current) use of aspirin: Secondary | ICD-10-CM

## 2018-09-02 DIAGNOSIS — Z833 Family history of diabetes mellitus: Secondary | ICD-10-CM | POA: Diagnosis not present

## 2018-09-02 DIAGNOSIS — Z87891 Personal history of nicotine dependence: Secondary | ICD-10-CM | POA: Diagnosis not present

## 2018-09-02 DIAGNOSIS — J850 Gangrene and necrosis of lung: Secondary | ICD-10-CM | POA: Diagnosis not present

## 2018-09-02 DIAGNOSIS — C3491 Malignant neoplasm of unspecified part of right bronchus or lung: Secondary | ICD-10-CM | POA: Diagnosis present

## 2018-09-02 DIAGNOSIS — D649 Anemia, unspecified: Secondary | ICD-10-CM | POA: Diagnosis present

## 2018-09-02 DIAGNOSIS — I130 Hypertensive heart and chronic kidney disease with heart failure and stage 1 through stage 4 chronic kidney disease, or unspecified chronic kidney disease: Secondary | ICD-10-CM | POA: Diagnosis not present

## 2018-09-02 LAB — CBC WITH DIFFERENTIAL/PLATELET
Abs Immature Granulocytes: 0.05 10*3/uL (ref 0.00–0.07)
Basophils Absolute: 0.1 10*3/uL (ref 0.0–0.1)
Basophils Relative: 1 %
Eosinophils Absolute: 0.4 10*3/uL (ref 0.0–0.5)
Eosinophils Relative: 3 %
HCT: 35.7 % — ABNORMAL LOW (ref 39.0–52.0)
Hemoglobin: 11 g/dL — ABNORMAL LOW (ref 13.0–17.0)
Immature Granulocytes: 0 %
Lymphocytes Relative: 14 %
Lymphs Abs: 1.9 10*3/uL (ref 0.7–4.0)
MCH: 25.6 pg — ABNORMAL LOW (ref 26.0–34.0)
MCHC: 30.8 g/dL (ref 30.0–36.0)
MCV: 83 fL (ref 80.0–100.0)
Monocytes Absolute: 1 10*3/uL (ref 0.1–1.0)
Monocytes Relative: 7 %
Neutro Abs: 10.4 10*3/uL — ABNORMAL HIGH (ref 1.7–7.7)
Neutrophils Relative %: 75 %
Platelets: 312 10*3/uL (ref 150–400)
RBC: 4.3 MIL/uL (ref 4.22–5.81)
RDW: 14.7 % (ref 11.5–15.5)
WBC: 13.8 10*3/uL — ABNORMAL HIGH (ref 4.0–10.5)
nRBC: 0 % (ref 0.0–0.2)

## 2018-09-02 LAB — COMPREHENSIVE METABOLIC PANEL
ALT: 13 U/L (ref 0–44)
AST: 18 U/L (ref 15–41)
Albumin: 2.9 g/dL — ABNORMAL LOW (ref 3.5–5.0)
Alkaline Phosphatase: 62 U/L (ref 38–126)
Anion gap: 13 (ref 5–15)
BUN: 13 mg/dL (ref 8–23)
CO2: 21 mmol/L — ABNORMAL LOW (ref 22–32)
Calcium: 9.9 mg/dL (ref 8.9–10.3)
Chloride: 99 mmol/L (ref 98–111)
Creatinine, Ser: 1.23 mg/dL (ref 0.61–1.24)
GFR calc Af Amer: >60 mL/min (ref >60)
GFR calc non Af Amer: 55 mL/min — ABNORMAL LOW (ref >60)
Glucose, Bld: 146 mg/dL — ABNORMAL HIGH (ref 70–99)
Potassium: 4.5 mmol/L (ref 3.5–5.1)
Sodium: 133 mmol/L — ABNORMAL LOW (ref 135–145)
Total Bilirubin: 0.7 mg/dL (ref 0.3–1.2)
Total Protein: 7.1 g/dL (ref 6.5–8.1)

## 2018-09-02 LAB — CBG MONITORING, ED: Glucose-Capillary: 118 mg/dL — ABNORMAL HIGH (ref 70–99)

## 2018-09-02 LAB — D-DIMER, QUANTITATIVE: D-Dimer, Quant: 0.87 ug/mL-FEU — ABNORMAL HIGH (ref 0.00–0.50)

## 2018-09-02 LAB — SARS CORONAVIRUS 2 BY RT PCR (HOSPITAL ORDER, PERFORMED IN ~~LOC~~ HOSPITAL LAB): SARS Coronavirus 2: NEGATIVE

## 2018-09-02 LAB — TSH: TSH: 2.007 u[IU]/mL (ref 0.350–4.500)

## 2018-09-02 LAB — MAGNESIUM: Magnesium: 1.8 mg/dL (ref 1.7–2.4)

## 2018-09-02 LAB — TROPONIN I: Troponin I: <0.03 ng/mL (ref <0.03)

## 2018-09-02 LAB — ABO/RH: ABO/RH(D): A NEG

## 2018-09-02 MED ORDER — ALBUTEROL SULFATE HFA 108 (90 BASE) MCG/ACT IN AERS
2.0000 | INHALATION_SPRAY | Freq: Four times a day (QID) | RESPIRATORY_TRACT | Status: DC | PRN
  Filled 2018-09-02: qty 6.7

## 2018-09-02 MED ORDER — ALBUTEROL SULFATE (2.5 MG/3ML) 0.083% IN NEBU: 5.0000 mg | INHALATION_SOLUTION | Freq: Once | RESPIRATORY_TRACT | Status: DC

## 2018-09-02 MED ORDER — INSULIN DETEMIR 100 UNIT/ML ~~LOC~~ SOLN
15.0000 [IU] | Freq: Every day | SUBCUTANEOUS | Status: DC
  Administered 2018-09-03 – 2018-09-04 (×2): 15 [IU] via SUBCUTANEOUS
  Filled 2018-09-02 (×3): qty 0.15

## 2018-09-02 MED ORDER — DILTIAZEM LOAD VIA INFUSION
10.0000 mg | Freq: Once | INTRAVENOUS | Status: AC
  Administered 2018-09-02: 10 mg via INTRAVENOUS
  Filled 2018-09-02: qty 10

## 2018-09-02 MED ORDER — DRONABINOL 2.5 MG PO CAPS: 2.5000 mg | ORAL_CAPSULE | Freq: Two times a day (BID) | ORAL | Status: DC

## 2018-09-02 MED ORDER — HEPARIN (PORCINE) 25000 UT/250ML-% IV SOLN
1600.0000 [IU]/h | INTRAVENOUS | Status: DC
  Administered 2018-09-02: 1500 [IU]/h via INTRAVENOUS
  Administered 2018-09-03: 1600 [IU]/h via INTRAVENOUS
  Administered 2018-09-03: 1500 [IU]/h via INTRAVENOUS
  Filled 2018-09-02 (×3): qty 250

## 2018-09-02 MED ORDER — ACETAMINOPHEN-CODEINE #3 300-30 MG PO TABS: 1.0000 | ORAL_TABLET | ORAL | Status: DC | PRN

## 2018-09-02 MED ORDER — PIPERACILLIN-TAZOBACTAM 3.375 G IVPB
3.3750 g | Freq: Three times a day (TID) | INTRAVENOUS | Status: DC
  Administered 2018-09-02 – 2018-09-03 (×3): 3.375 g via INTRAVENOUS
  Filled 2018-09-02 (×7): qty 50

## 2018-09-02 MED ORDER — INSULIN ASPART 100 UNIT/ML ~~LOC~~ SOLN: 0.0000 [IU] | Freq: Three times a day (TID) | SUBCUTANEOUS | Status: DC

## 2018-09-02 MED ORDER — DILTIAZEM HCL-DEXTROSE 100-5 MG/100ML-% IV SOLN (PREMIX)
5.0000 mg/h | INTRAVENOUS | Status: DC
  Administered 2018-09-02: 5 mg/h via INTRAVENOUS
  Administered 2018-09-02 – 2018-09-03 (×2): 12.5 mg/h via INTRAVENOUS
  Filled 2018-09-02 (×3): qty 100

## 2018-09-02 MED ORDER — ASPIRIN EC 81 MG PO TBEC
81.0000 mg | DELAYED_RELEASE_TABLET | Freq: Every day | ORAL | Status: DC
  Administered 2018-09-03 – 2018-09-04 (×2): 81 mg via ORAL
  Filled 2018-09-02 (×2): qty 1

## 2018-09-02 MED ORDER — LATANOPROST 0.005 % OP SOLN
1.0000 [drp] | Freq: Every day | OPHTHALMIC | Status: DC
  Administered 2018-09-03: 1 [drp] via OPHTHALMIC
  Filled 2018-09-02: qty 2.5

## 2018-09-02 MED ORDER — HEPARIN BOLUS VIA INFUSION
5000.0000 [IU] | Freq: Once | INTRAVENOUS | Status: AC
  Administered 2018-09-02: 5000 [IU] via INTRAVENOUS
  Filled 2018-09-02: qty 5000

## 2018-09-02 MED ORDER — PANTOPRAZOLE SODIUM 40 MG PO TBEC
40.0000 mg | DELAYED_RELEASE_TABLET | Freq: Every day | ORAL | Status: DC
  Administered 2018-09-02 – 2018-09-04 (×3): 40 mg via ORAL
  Filled 2018-09-02 (×3): qty 1

## 2018-09-02 NOTE — ED Triage Notes (Addendum)
Pt arrives EMS from home where he c/o increased SHOB over lasxt 2 days.. Diagnosed with Lung CA last Wednesday. o2 99 % on room air. ?New afib. Pt has cough producing yellow phlegm but over last several months.

## 2018-09-02 NOTE — H&P (Signed)
History and Physical    Roger Blackwell EZM:[id number] DOB: [birth year] DOA: 09/02/2018  I have briefly reviewed the patient's prior medical records in [place]  PCP: [name], MD  Patient coming from: Home  Chief Complaint: Shortness of breath  HPI: Roger Blackwell is a 83 y.o. male with medical history significant of recently diagnosed squamous cell carcinoma of the lung and just established with oncology, recent history in January 2020 with severe necrotizing right lower lobe pneumonia probably from aspiration, coronary artery disease status post remote PCI, hypertension, diastolic CHF, presents with chief complaint of shortness of breath.  Patient tells me that his shortness of breath started yesterday.  He has been having intermittent shortness of breath since his necrotizing pneumonia but it is gotten significantly worse in the last 24 hours.  Patient also reports intermittent fevers over the last several weeks, he is a poor historian but thinks that he has been having on and off fevers since January.  He also reports a cough which is chronic in nature and feels like it is gotten a little bit worse also with sputum production.  He denies any flulike illness.  He is also been complaining of a sore throat in the setting of his cough.  He denies any chest pain.  He denies any palpitations.  He has no abdominal pain, nausea or vomiting.  ED Course: In the ED patient was found to be in A. fib with RVR which is new onset for him.  He is afebrile here, satting well on room air.  His blood work shows mild leukocytosis with a white count of 13.8 but otherwise fairly unremarkable.  He was placed on Cardizem infusion and we are asked to admit  Review of Systems: As per HPI otherwise 10 point review of systems negative.   Past Medical History:  Diagnosis Date  . CAD (coronary artery disease)   . CHF (congestive heart failure) ([place])   . DM (diabetes mellitus) ([place])   . Dyslipidemia   .  Gout   . Morbid obesity ([place])   . Systemic hypertension     Past Surgical History:  Procedure Laterality Date  . BACK SURGERY    . CORONARY ANGIOPLASTY WITH STENT PLACEMENT  12/26/2000   80% prox. circumflex lesion w/successful stent placement.  Marland Kitchen NM MYOVIEW LTD  08/31/2010   Normal  . PERICARDIOCENTESIS    . PROSTATE SURGERY    . US ECHOCARDIOGRAPHY  08/31/2010   EF 50-55%,RV mildly dilated,mild aortic root dilatation  . VIDEO BRONCHOSCOPY Bilateral 05/23/2018   Procedure: VIDEO BRONCHOSCOPY WITHOUT FLUORO;  Surgeon: Marshell Garfinkel, MD;  Location: [facility];  Service: Cardiopulmonary;  Laterality: Bilateral;     reports that he quit smoking about 25 years ago. His smoking use included cigarettes. He has never used smokeless tobacco. He reports current alcohol use. He reports that he does not use drugs.  Allergies  Allergen Reactions  . Ivp Dye [Iodinated Diagnostic Agents] Other (See Comments)    Blisters     Family History  Problem Relation Age of Onset  . Diabetes Brother   . Hypertension Sister     Prior to Admission medications   Medication Sig Start Date End Date Taking? Authorizing Provider  acetaminophen-codeine (TYLENOL #3) 300-30 MG tablet Take 1 tablet by mouth every 4 (four) hours as needed for moderate pain. Patient not taking: Reported on 08/22/2018 07/04/18   Tanda Rockers, MD  albuterol (PROVENTIL HFA;VENTOLIN HFA) 108 (90 Base) MCG/ACT inhaler Inhale 2  puffs into the lungs every 6 (six) hours as needed for wheezing or shortness of breath. 08/30/18   Ivin Poot, MD  allopurinol (ZYLOPRIM) 100 MG tablet Take 300 mg by mouth daily. Take 3 tabs (300mg ) PO daily    [provider]  aspirin 81 MG tablet Take 81 mg by mouth daily.    [provider]  cephALEXin (KEFLEX) 500 MG capsule Take 1 capsule (500 mg total) by mouth 3 (three) times daily. 08/30/18   Ivin Poot, MD  dronabinol (MARINOL) 2.5 MG capsule Take 1 capsule (2.5 mg total) by  mouth 2 (two) times daily before lunch and supper. 08/30/18   Ivin Poot, MD  furosemide (LASIX) 80 MG tablet Take 1 tablet (80 mg total) by mouth daily. Patient taking differently: Take 80 mg by mouth daily as needed for fluid.  05/22/13   Croitoru, Mihai, MD  insulin detemir (LEVEMIR) 100 UNIT/ML injection Inject 25 Units into the skin daily as needed (high blood pressure).     [provider]  insulin lispro (HUMALOG KWIKPEN) 100 UNIT/ML KwikPen Inject 5 Units into the skin See admin instructions. If blood sugar get above 150,  5 units with meals as needed    [provider]  iron polysaccharides (NIFEREX) 150 MG capsule Take 150 mg by mouth daily.     [provider]  losartan (COZAAR) 100 MG tablet Take 1 tablet (100 mg total) by mouth daily. 03/17/18 03/12/19  Croitoru, Mihai, MD  pantoprazole (PROTONIX) 40 MG tablet Take 40 mg by mouth daily.  06/04/16   [provider]  Potassium 99 MG TABS Take 1 tablet by mouth every other day.    [provider]  Travoprost, BAK Free, (TRAVATAN) 0.004 % SOLN ophthalmic solution Place 1 drop into both eyes at bedtime.    [provider]  vitamin B-12 (CYANOCOBALAMIN) 500 MCG tablet Take 500 mcg by mouth daily.    [provider]    Physical Exam: Vitals:   09/02/18 1500 09/02/18 1512 09/02/18 1513 09/02/18 1515  BP:  129/80  (!) 134/91  Pulse: (!) 117 (!) 118 (!) 126 73  Resp: 15 (!) 27 (!) 22 (!) 25  Temp:      TempSrc:      SpO2: 99% 97% 100% 100%  Weight:      Height:       Constitutional: NAD, calm, comfortable Eyes: PERRL, lids and conjunctivae normal ENMT: Mucous membranes are moist.  Neck: normal, supple Respiratory: Diminished breath sounds at the bases, overall distant breath sounds, no overt wheezing or crackles.  Increased respiratory effort Cardiovascular: Irregularly irregular, no murmurs appreciated.  No peripheral edema Abdomen: no tenderness, no masses palpated.  Bowel sounds positive.  Musculoskeletal: no clubbing / cyanosis. Normal muscle tone.  Skin: no rashes, lesions, ulcers. No induration Neurologic: CN 2-12 grossly intact. Strength 5/5 in all 4.  Psychiatric: Normal judgment and insight. Alert and oriented x 3. Normal mood.   Labs on Admission: I have personally reviewed following labs and imaging studies  CBC: Recent Labs  Lab 09/02/18 1200  WBC 13.8*  NEUTROABS 10.4*  HGB 11.0*  HCT 35.7*  MCV 83.0  PLT 564   Basic Metabolic Panel: Recent Labs  Lab 09/02/18 1200  NA 133*  K 4.5  CL 99  CO2 21*  GLUCOSE 146*  BUN 13  CREATININE 1.23  CALCIUM 9.9  MG 1.8   GFR: Estimated Creatinine Clearance: 62.9 mL/min (by C-G formula based  on SCr of 1.23 mg/dL). Liver Function Tests: Recent Labs  Lab 09/02/18 1200  AST 18  ALT 13  ALKPHOS 62  BILITOT 0.7  PROT 7.1  ALBUMIN 2.9*   No results for input(s): LIPASE, AMYLASE in the last 168 hours. No results for input(s): AMMONIA in the last 168 hours. Coagulation Profile: No results for input(s): INR, PROTIME in the last 168 hours. Cardiac Enzymes: Recent Labs  Lab 09/02/18 1200  TROPONINI <0.03   BNP (last 3 results) No results for input(s): PROBNP in the last 8760 hours. HbA1C: No results for input(s): HGBA1C in the last 72 hours. CBG: No results for input(s): GLUCAP in the last 168 hours. Lipid Profile: No results for input(s): CHOL, HDL, LDLCALC, TRIG, CHOLHDL, LDLDIRECT in the last 72 hours. Thyroid Function Tests: Recent Labs    09/02/18 1323  TSH 2.007   Anemia Panel: No results for input(s): VITAMINB12, FOLATE, FERRITIN, TIBC, IRON, RETICCTPCT in the last 72 hours. Urine analysis:    Component Value Date/Time   COLORURINE YELLOW 05/19/2018 1739   APPEARANCEUR HAZY (A) 05/19/2018 1739   LABSPEC 1.016 05/19/2018 1739   PHURINE 5.0 05/19/2018 1739   GLUCOSEU NEGATIVE 05/19/2018 1739   HGBUR SMALL (A) 05/19/2018 1739   BILIRUBINUR NEGATIVE 05/19/2018  1739   KETONESUR NEGATIVE 05/19/2018 1739   PROTEINUR NEGATIVE 05/19/2018 1739   UROBILINOGEN 0.2 07/14/2007 1105   NITRITE NEGATIVE 05/19/2018 1739   LEUKOCYTESUR MODERATE (A) 05/19/2018 1739     Radiological Exams on Admission: Dg Chest 2 View  Result Date: 09/02/2018 CLINICAL DATA:  Shortness of breath EXAM: CHEST - 2 VIEW COMPARISON:  August 23, 2018 FINDINGS: Pleural calcifications are seen bilaterally. Pleural thickening and opacity in the right base is similar to previous studies. The cardiomediastinal silhouette is stable. No other acute abnormalities. IMPRESSION: 1. Pleural thickening, pleural calcifications, and bibasilar opacities, right greater than left, are similar since July 17, 2018 and perhaps mildly worsened since August 23, 2018. No other changes. Electronically Signed   By: Dorise Bullion III M.D   On: 09/02/2018 12:54    EKG: Independently reviewed.  A. fib with RVR  Assessment/Plan Principal Problem:   Atrial fibrillation with RVR (HCC) Active Problems:   CAD s/p left circumflex coronary stents 2002   Chronic diastolic heart failure (HCC)   Essential hypertension   Type 2 diabetes mellitus with stage 3 chronic kidney disease, with long-term current use of insulin (HCC)   CKD (chronic kidney disease) stage 3, GFR 30-59 ml/min (HCC)   Necrotizing pneumonia (HCC)   Principal Problem A. fib with RVR -Patient was started on Cardizem infusion, he is rate controlled currently and will continue for now.  If remains rate control switch to oral Cardizem in the morning.  Patient has no history of significant bleeding in the past, discussed with him regarding A. fib and increased risk of strokes, benefits and risks of anticoagulation, will go ahead and start heparin.  If tolerates well can potentially be transitioned to [place] -He will be ruled out for coronavirus as below, if he tests negative will need an updated 2D echo  Active Problems Dyspnea on exertion/shortness of  breath /recent history of necrotizing pneumonia -Patient is not hypoxic but feels significantly short of breath with minimal activity, this may be multifactorial in the setting of A. fib with RVR, recent necrotizing pneumonia, however given intermittent cough, reported fevers and sore throat will rule out coronavirus.  Chest x-ray with potentially worsening bibasilar opacities, will obtain a CT  scan of the chest to better reevaluate for infiltrates/acute infectious processes.  Given leukocytosis I would favor to place patient on Zosyn for aspiration pneumonia which can be discontinued if CT scan is unremarkable.  Type 2 diabetes mellitus -Continue Levemir and sliding scale insulin  Chronic diastolic CHF -Currently appears euvolemic  Hypertension -Hold losartan as well as home furosemide, patient currently on Cardizem infusion  Chronic kidney disease stage III -Creatinine appears at baseline   DVT prophylaxis: Heparin infusion Code Status: Full code Family Communication: No family at bedside Disposition Plan: Admit to progressive, likely home when ready Consults called: None   Marzetta Board, MD, PhD [facility]  Contact via [email or web address]  [facility] Office Info P: [phone]  F: [phone]   09/02/2018, 3:51 PM

## 2018-09-02 NOTE — ED Notes (Signed)
Pt back from x-ray.

## 2018-09-02 NOTE — ED Provider Notes (Signed)
[facility] EMERGENCY DEPARTMENT Provider Note   CSN: [id number] Arrival date & time: 09/02/18  1152    History   Chief Complaint Chief Complaint  Patient presents with  . Shortness of Breath    HPI Roger Blackwell is a 82 y.o. male.     HPI Patient presents with shortness of breath.  Has had for the last couple days.  Cough.  However that is been going for the last few months.  No real change in the sputum production.  Had a necrotizing pneumonia found to have lung cancer also.  Not a surgical candidate due to comorbidities.  Seen last night by EMS and found to have new onset A. fib with RVR.  Decided not to come into the hospital with them.  States he left up to them and they did not think he needed to come in.  No asymmetric swelling in his legs.  No real chest pain.  States he feels more fatigued. Past Medical History:  Diagnosis Date  . CAD (coronary artery disease)   . CHF (congestive heart failure) ([place])   . DM (diabetes mellitus) ([place])   . Dyslipidemia   . Gout   . Morbid obesity ([place])   . Systemic hypertension     Patient Active Problem List   Diagnosis Date Noted  . DOE (dyspnea on exertion) 07/17/2018  . Necrotizing pneumonia ([place]) 06/15/2018  . S/P bronchoscopy with biopsy   . Malnutrition of moderate degree 05/21/2018  . Hemoptysis 05/20/2018  . Weight loss   . Cough with hemoptysis 05/19/2018  . Acute diastolic CHF (congestive heart failure) ([place]) 05/19/2018  . Gout 05/19/2018  . Community acquired pneumonia of right lung   . CKD (chronic kidney disease) stage 3, GFR 30-59 ml/min (HCC) 03/19/2018  . Iron deficiency anemia 08/10/2017  . Dyslipidemia 12/30/2014  . CAD s/p left circumflex coronary stents 2002 05/25/2013  . Class 2 severe obesity due to excess calories with serious comorbidity and body mass index (BMI) of 36.0 to 36.9 in adult ([place]) 05/25/2013  . Chronic diastolic heart failure ([place]) 05/25/2013  . Hypercholesterolemia  05/25/2013  . Essential hypertension 05/25/2013  . Glaucoma 05/25/2013  . Type 2 diabetes mellitus with stage 3 chronic kidney disease, with long-term current use of insulin ([place]) 05/25/2013    Past Surgical History:  Procedure Laterality Date  . BACK SURGERY    . CORONARY ANGIOPLASTY WITH STENT PLACEMENT  12/26/2000   80% prox. circumflex lesion w/successful stent placement.  Marland Kitchen NM MYOVIEW LTD  08/31/2010   Normal  . PERICARDIOCENTESIS    . PROSTATE SURGERY    . US ECHOCARDIOGRAPHY  08/31/2010   EF 50-55%,RV mildly dilated,mild aortic root dilatation  . VIDEO BRONCHOSCOPY Bilateral 05/23/2018   Procedure: VIDEO BRONCHOSCOPY WITHOUT FLUORO;  Surgeon: Marshell Garfinkel, MD;  Location: [facility];  Service: Cardiopulmonary;  Laterality: Bilateral;        Home Medications    Prior to Admission medications   Medication Sig Start Date End Date Taking? Authorizing Provider  acetaminophen-codeine (TYLENOL #3) 300-30 MG tablet Take 1 tablet by mouth every 4 (four) hours as needed for moderate pain. Patient not taking: Reported on 08/22/2018 07/04/18   Tanda Rockers, MD  albuterol (PROVENTIL HFA;VENTOLIN HFA) 108 (90 Base) MCG/ACT inhaler Inhale 2 puffs into the lungs every 6 (six) hours as needed for wheezing or shortness of breath. 08/30/18   Ivin Poot, MD  allopurinol (ZYLOPRIM) 100 MG tablet Take 300 mg by mouth daily.  Take 3 tabs (300mg ) PO daily    [provider]  aspirin 81 MG tablet Take 81 mg by mouth daily.    [provider]  cephALEXin (KEFLEX) 500 MG capsule Take 1 capsule (500 mg total) by mouth 3 (three) times daily. 08/30/18   Ivin Poot, MD  dronabinol (MARINOL) 2.5 MG capsule Take 1 capsule (2.5 mg total) by mouth 2 (two) times daily before lunch and supper. 08/30/18   Ivin Poot, MD  furosemide (LASIX) 80 MG tablet Take 1 tablet (80 mg total) by mouth daily. Patient taking differently: Take 80 mg by mouth daily as needed for fluid.  05/22/13    Croitoru, Mihai, MD  insulin detemir (LEVEMIR) 100 UNIT/ML injection Inject 25 Units into the skin daily as needed (high blood pressure).     [provider]  insulin lispro (HUMALOG KWIKPEN) 100 UNIT/ML KwikPen Inject 5 Units into the skin See admin instructions. If blood sugar get above 150,  5 units with meals as needed    [provider]  iron polysaccharides (NIFEREX) 150 MG capsule Take 150 mg by mouth daily.     [provider]  losartan (COZAAR) 100 MG tablet Take 1 tablet (100 mg total) by mouth daily. 03/17/18 03/12/19  Croitoru, Mihai, MD  pantoprazole (PROTONIX) 40 MG tablet Take 40 mg by mouth daily.  06/04/16   [provider]  Potassium 99 MG TABS Take 1 tablet by mouth every other day.    [provider]  Travoprost, BAK Free, (TRAVATAN) 0.004 % SOLN ophthalmic solution Place 1 drop into both eyes at bedtime.    [provider]  vitamin B-12 (CYANOCOBALAMIN) 500 MCG tablet Take 500 mcg by mouth daily.    [provider]    Family History Family History  Problem Relation Age of Onset  . Diabetes Brother   . Hypertension Sister     Social History Social History   Tobacco Use  . Smoking status: Former Smoker    Types: Cigarettes    Last attempt to quit: 05/16/1993    Years since quitting: 25.3  . Smokeless tobacco: Never Used  Substance Use Topics  . Alcohol use: Yes    Comment: seldom  . Drug use: No     Allergies   Ivp dye [iodinated diagnostic agents]   Review of Systems Review of Systems  Constitutional: Positive for appetite change.  HENT: Negative for congestion.   Respiratory: Positive for cough and shortness of breath.   Cardiovascular: Negative for chest pain and leg swelling.  Gastrointestinal: Negative for abdominal pain.  Genitourinary: Negative for frequency.  Musculoskeletal: Negative for back pain.  Skin: Negative for rash.  Neurological: Positive for weakness.   Psychiatric/Behavioral: Negative for confusion.     Physical Exam Updated Vital Signs BP (!) 109/56   Pulse 100   Temp 98.2 F (36.8 C) (Oral)   Resp (!) 21   Ht 6\' 2"  (1.88 m)   Wt 108.9 kg   SpO2 98%   BMI 30.81 kg/m   Physical Exam Vitals signs and nursing note reviewed.  HENT:     Head: Atraumatic.  Cardiovascular:     Comments: Irregular tachycardia Pulmonary:     Comments: Mildly harsh breath sounds.  No focal rales or rhonchi. Chest:     Chest wall: No tenderness.     Comments: Some skin changes left upper chest. Abdominal:     Tenderness: There is no abdominal tenderness.  Musculoskeletal:  Right lower leg: No edema.     Left lower leg: No edema.  Skin:    General: Skin is warm.  Neurological:     General: No focal deficit present.     Mental Status: He is alert.      ED Treatments / Results  Labs (all labs ordered are listed, but only abnormal results are displayed) Labs Reviewed  COMPREHENSIVE METABOLIC PANEL - Abnormal; Notable for the following components:      Result Value   Sodium 133 (*)    CO2 21 (*)    Glucose, Bld 146 (*)    Albumin 2.9 (*)    GFR calc non Af Amer 55 (*)    All other components within normal limits  CBC WITH DIFFERENTIAL/PLATELET - Abnormal; Notable for the following components:   WBC 13.8 (*)    Hemoglobin 11.0 (*)    HCT 35.7 (*)    MCH 25.6 (*)    Neutro Abs 10.4 (*)    All other components within normal limits  TSH  TROPONIN I  MAGNESIUM    EKG EKG Interpretation  Date/Time:  Saturday September 02 2018 11:58:40 EDT Ventricular Rate:  140 PR Interval:    QRS Duration: 80 QT Interval:  308 QTC Calculation: 470 R Axis:   22 Text Interpretation:  Atrial fibrillation with rapid V-rate Paired ventricular premature complexes Confirmed by Davonna Belling [id number]) on 09/02/2018 12:08:33 PM   Radiology Dg Chest 2 View  Result Date: 09/02/2018 CLINICAL DATA:  Shortness of breath EXAM: CHEST - 2 VIEW  COMPARISON:  August 23, 2018 FINDINGS: Pleural calcifications are seen bilaterally. Pleural thickening and opacity in the right base is similar to previous studies. The cardiomediastinal silhouette is stable. No other acute abnormalities. IMPRESSION: 1. Pleural thickening, pleural calcifications, and bibasilar opacities, right greater than left, are similar since July 17, 2018 and perhaps mildly worsened since August 23, 2018. No other changes. Electronically Signed   By: Dorise Bullion III M.D   On: 09/02/2018 12:54    Procedures Procedures (including critical care time)  Medications Ordered in ED Medications  diltiazem (CARDIZEM) 1 mg/mL load via infusion 10 mg (has no administration in time range)    And  diltiazem (CARDIZEM) 100 mg in dextrose 5% 141mL (1 mg/mL) infusion (has no administration in time range)     Initial Impression / Assessment and Plan / ED Course  I have reviewed the triage vital signs and the nursing notes.  Pertinent labs & imaging results that were available during my care of the patient were reviewed by me and considered in my medical decision making (see chart for details).       Chads vasc of at least 6 for age CHF hypertension vascular disease and diabetes. Patient presents with shortness of breath.  New onset A. fib with RVR.  Lab work reassuring.  X-ray stable.  Pulmonary embolism considered but does have a dye allergy.  Does have known lung cancer also.  Will admit to hospitalist.  CRITICAL CARE Performed by: Davonna Belling Total critical care time: 30 minutes Critical care time was exclusive of separately billable procedures and treating other patients. Critical care was necessary to treat or prevent imminent or life-threatening deterioration. Critical care was time spent personally by me on the following activities: development of treatment plan with patient and/or surrogate as well as nursing, discussions with consultants, evaluation of patient's  response to treatment, examination of patient, obtaining history from patient or surrogate, ordering and performing  treatments and interventions, ordering and review of laboratory studies, ordering and review of radiographic studies, pulse oximetry and re-evaluation of patient's condition.   Final Clinical Impressions(s) / ED Diagnoses   Final diagnoses:  Atrial fibrillation with rapid ventricular response [facility])    ED Discharge Orders    None       Davonna Belling, MD 09/02/18 1505

## 2018-09-02 NOTE — ED Notes (Signed)
Spoke to pharmacy about verifying Levemir and was told that the pharmacy tech called the wife and left a message to find out what is the dosage and how often does the patient take it. The patient states " I take my insulin as needed when my blood sugar is over 150."

## 2018-09-02 NOTE — Progress Notes (Signed)
Pharmacy Antibiotic Note  Roger Blackwell is a 83 y.o. male admitted on 09/02/2018 with A. Fib w/ RVR and SOB w/ leukocytosis, concern for aspiration pna.  Pharmacy has been consulted for zosyn dosing.  SCr 1.23, CrCl~63 ml/min  Plan: Zosyn 3.375g IV every 8 hours Monitor renal function, CT for ability to r/o and discontinue   Height: 6\' 2"  (188 cm) Weight: 240 lb (108.9 kg) IBW/kg (Calculated) : 82.2  Temp (24hrs), Avg:98.6 F (37 C), Min:98.2 F (36.8 C), Max:98.9 F (37.2 C)  Recent Labs  Lab 09/02/18 1200  WBC 13.8*  CREATININE 1.23    Estimated Creatinine Clearance: 62.9 mL/min (by C-G formula based on SCr of 1.23 mg/dL).    Allergies  Allergen Reactions  . Ivp Dye [Iodinated Diagnostic Agents] Other (See Comments)    Blisters     Antimicrobials this admission: Zosyn 4/18>>  Dose adjustments this admission: N/a  Microbiology results: 4/18 COVID 19: sent  Bertis Ruddy, PharmD Clinical Pharmacist Please check AMION for all [place] numbers 09/02/2018 4:11 PM

## 2018-09-02 NOTE — Progress Notes (Signed)
ANTICOAGULATION CONSULT NOTE - Initial Consult  Pharmacy Consult for heparin Indication: atrial fibrillation  Allergies  Allergen Reactions  . Ivp Dye [Iodinated Diagnostic Agents] Other (See Comments)    Blisters     Patient Measurements: Height: 6\' 2"  (188 cm) Weight: 240 lb (108.9 kg) IBW/kg (Calculated) : 82.2 Heparin Dosing Weight: 105kg  Vital Signs: Temp: 98.2 F (36.8 C) (04/18 1158) Temp Source: Oral (04/18 1158) BP: 134/91 (04/18 1515) Pulse Rate: 73 (04/18 1515)  Labs: Recent Labs    09/02/18 1200  HGB 11.0*  HCT 35.7*  PLT 312  CREATININE 1.23  TROPONINI <0.03    Estimated Creatinine Clearance: 62.9 mL/min (by C-G formula based on SCr of 1.23 mg/dL).   Medical History: Past Medical History:  Diagnosis Date  . CAD (coronary artery disease)   . CHF (congestive heart failure) ([place])   . DM (diabetes mellitus) ([place])   . Dyslipidemia   . Gout   . Morbid obesity ([place])   . Systemic hypertension     Assessment: 82 YOM with new onset Afib, no anticoagulation PTA, H/H chronically low but stable, plts wnl.   Goal of Therapy:  Heparin level 0.3-0.7 units/ml Monitor platelets by anticoagulation protocol: Yes   Plan:  Heparin 5000 units IV x 1, and gtt at 1500 units/hr F/u 8 hour heparin level  Bertis Ruddy, PharmD Clinical Pharmacist Please check AMION for all [place] numbers 09/02/2018 3:57 PM

## 2018-09-02 NOTE — ED Notes (Signed)
ED TO INPATIENT HANDOFF REPORT  ED Nurse Name and Phone #: [phone]  S Name/Age/Gender Roger Blackwell 83 y.o. male Room/Bed: 021C/021C  Code Status   Code Status: Full Code  Home/SNF/Other Home Patient oriented to: self, place, time and situation Is this baseline? Yes   Triage Complete: Triage complete  Chief Complaint SOB  Triage Note Pt arrives EMS from home where he c/o increased SHOB over lasxt 2 days.. Diagnosed with Lung CA last Wednesday. o2 99 % on room air. ?New afib. Pt has cough producing yellow phlegm but over last several months.    Allergies Allergies  Allergen Reactions  . Codeine Nausea And Vomiting and Other (See Comments)    Made the patient feel badly, also  . Ivp Dye [Iodinated Diagnostic Agents] Other (See Comments)    Blisters all over the body    Level of Care/Admitting Diagnosis ED Disposition    ED Disposition Condition [place]: [place] [100100]  Level of Care: Progressive [102]  I expect the patient will be discharged within 24 hours: No (not a candidate for 5C-Observation unit)  Covid Evaluation: Person Under Investigation (PUI)  Isolation Risk Level: Low Risk  (Less than 4L [place] supplementation)  Diagnosis: Atrial fibrillation with RVR [facility]) [063016]  Admitting Physician: Caren Griffins [phone]  Attending Physician: Caren Griffins [[id number]]  PT Class (Do Not Modify): Observation [104]  PT Acc Code (Do Not Modify): Observation [10022]       B Medical/Surgery History Past Medical History:  Diagnosis Date  . CAD (coronary artery disease)   . CHF (congestive heart failure) ([place])   . DM (diabetes mellitus) ([place])   . Dyslipidemia   . Gout   . Morbid obesity ([place])   . Systemic hypertension    Past Surgical History:  Procedure Laterality Date  . BACK SURGERY    . CORONARY ANGIOPLASTY WITH STENT PLACEMENT  12/26/2000   80% prox. circumflex lesion w/successful stent placement.  Marland Kitchen NM  MYOVIEW LTD  08/31/2010   Normal  . PERICARDIOCENTESIS    . PROSTATE SURGERY    . US ECHOCARDIOGRAPHY  08/31/2010   EF 50-55%,RV mildly dilated,mild aortic root dilatation  . VIDEO BRONCHOSCOPY Bilateral 05/23/2018   Procedure: VIDEO BRONCHOSCOPY WITHOUT FLUORO;  Surgeon: Marshell Garfinkel, MD;  Location: [facility];  Service: Cardiopulmonary;  Laterality: Bilateral;     A IV Location/Drains/Wounds Patient Lines/Drains/Airways Status   Active Line/Drains/Airways    Name:   Placement date:   Placement time:   Site:   Days:   Peripheral IV 09/02/18 Left Antecubital   09/02/18    1156    Antecubital   less than 1   Peripheral IV 09/02/18 Right Wrist   09/02/18    1638    Wrist   less than 1   Incision (Closed) 08/23/18 Back Right;Mid   08/23/18    1147     10          Intake/Output Last 24 hours No intake or output data in the 24 hours ending 09/02/18 1958  Labs/Imaging Results for orders placed or performed during the hospital encounter of 09/02/18 (from the past 48 hour(s))  Comprehensive metabolic panel     Status: Abnormal   Collection Time: 09/02/18 12:00 PM  Result Value Ref Range   Sodium 133 (L) 135 - 145 mmol/L   Potassium 4.5 3.5 - 5.1 mmol/L   Chloride 99 98 - 111 mmol/L   CO2 21 (L)  22 - 32 mmol/L   Glucose, Bld 146 (H) 70 - 99 mg/dL   BUN 13 8 - 23 mg/dL   Creatinine, Ser 1.23 0.61 - 1.24 mg/dL   Calcium 9.9 8.9 - 10.3 mg/dL   Total Protein 7.1 6.5 - 8.1 g/dL   Albumin 2.9 (L) 3.5 - 5.0 g/dL   AST 18 15 - 41 U/L   ALT 13 0 - 44 U/L   Alkaline Phosphatase 62 38 - 126 U/L   Total Bilirubin 0.7 0.3 - 1.2 mg/dL   GFR calc non Af Amer 55 (L) >60 mL/min   GFR calc Af Amer >60 >60 mL/min   Anion gap 13 5 - 15    Comment: Performed at [facility] [address], [place], [place] [address]  CBC with Differential     Status: Abnormal   Collection Time: 09/02/18 12:00 PM  Result Value Ref Range   WBC 13.8 (H) 4.0 - 10.5 K/uL   RBC 4.30 4.22 - 5.81 MIL/uL    Hemoglobin 11.0 (L) 13.0 - 17.0 g/dL   HCT 35.7 (L) 39.0 - 52.0 %   MCV 83.0 80.0 - 100.0 fL   MCH 25.6 (L) 26.0 - 34.0 pg   MCHC 30.8 30.0 - 36.0 g/dL   RDW 14.7 11.5 - 15.5 %   Platelets 312 150 - 400 K/uL   nRBC 0.0 0.0 - 0.2 %   Neutrophils Relative % 75 %   Neutro Abs 10.4 (H) 1.7 - 7.7 K/uL   Lymphocytes Relative 14 %   Lymphs Abs 1.9 0.7 - 4.0 K/uL   Monocytes Relative 7 %   Monocytes Absolute 1.0 0.1 - 1.0 K/uL   Eosinophils Relative 3 %   Eosinophils Absolute 0.4 0.0 - 0.5 K/uL   Basophils Relative 1 %   Basophils Absolute 0.1 0.0 - 0.1 K/uL   Immature Granulocytes 0 %   Abs Immature Granulocytes 0.05 0.00 - 0.07 K/uL    Comment: Performed at [facility] [address], [place], [place] [address]  Troponin I - Once     Status: None   Collection Time: 09/02/18 12:00 PM  Result Value Ref Range   Troponin I <0.03 <0.03 ng/mL    Comment: Performed at [facility] [address], [place], [place] [address]  Magnesium     Status: None   Collection Time: 09/02/18 12:00 PM  Result Value Ref Range   Magnesium 1.8 1.7 - 2.4 mg/dL    Comment: Performed at [facility], [place] [address], [place], [place] [address]  D-dimer, quantitative (not at [facility])     Status: Abnormal   Collection Time: 09/02/18 12:00 PM  Result Value Ref Range   D-Dimer, Quant 0.87 (H) 0.00 - 0.50 ug/mL-FEU    Comment: (NOTE) At the manufacturer cut-off of 0.50 ug/mL FEU, this assay has been documented to exclude PE with a sensitivity and negative predictive value of 97 to 99%.  At this time, this assay has not been approved by the FDA to exclude DVT/VTE. Results should be correlated with clinical presentation. Performed at [facility], [place] [address] [place], [place] [address]   TSH     Status: None   Collection Time: 09/02/18  1:23 PM  Result Value Ref Range   TSH 2.007 0.350 - 4.500 uIU/mL    Comment: Performed by a 3rd Generation assay with a functional sensitivity of  <=0.01 uIU/mL. Performed at [facility]  Lab, [address] [place], [place] [address]   SARS Coronavirus 2 [facility] order, Performed in [facility] hospital lab)     Status: None   Collection Time: 09/02/18  3:50 PM  Result Value Ref Range   SARS Coronavirus 2 NEGATIVE NEGATIVE    Comment: (NOTE) If result is NEGATIVE SARS-CoV-2 target nucleic acids are NOT DETECTED. The SARS-CoV-2 RNA is generally detectable in upper and lower  respiratory specimens during the acute phase of infection. The lowest  concentration of SARS-CoV-2 viral copies this assay can detect is 250  copies / mL. A negative result does not preclude SARS-CoV-2 infection  and should not be used as the sole basis for treatment or other  patient management decisions.  A negative result may occur with  improper specimen collection / handling, submission of specimen other  than nasopharyngeal swab, presence of viral mutation(s) within the  areas targeted by this assay, and inadequate number of viral copies  (<250 copies / mL). A negative result must be combined with clinical  observations, patient history, and epidemiological information. If result is POSITIVE SARS-CoV-2 target nucleic acids are DETECTED. The SARS-CoV-2 RNA is generally detectable in upper and lower  respiratory specimens dur ing the acute phase of infection.  Positive  results are indicative of active infection with SARS-CoV-2.  Clinical  correlation with patient history and other diagnostic information is  necessary to determine patient infection status.  Positive results do  not rule out bacterial infection or co-infection with other viruses. If result is PRESUMPTIVE POSTIVE SARS-CoV-2 nucleic acids MAY BE PRESENT.   A presumptive positive result was obtained on the submitted specimen  and confirmed on repeat testing.  While 2019 novel coronavirus  (SARS-CoV-2) nucleic acids may be present in the submitted sample  additional confirmatory  testing may be necessary for epidemiological  and / or clinical management purposes  to differentiate between  SARS-CoV-2 and other Sarbecovirus currently known to infect humans.  If clinically indicated additional testing with an alternate test  methodology [phone]) is advised. The SARS-CoV-2 RNA is generally  detectable in upper and lower respiratory sp ecimens during the acute  phase of infection. The expected result is Negative. Fact Sheet for Patients:  StrictlyIdeas.no Fact Sheet for Healthcare Providers: BankingDealers.co.za This test is not yet approved or cleared by the [place] FDA and has been authorized for detection and/or diagnosis of SARS-CoV-2 by FDA under an Emergency Use Authorization (EUA).  This EUA will remain in effect (meaning this test can be used) for the duration of the COVID-19 declaration under Section 564(b)(1) of the Act, 21 U.S.C. section 360bbb-3(b)(1), unless the authorization is terminated or revoked sooner. Performed at [facility], [place] [address] [place], [place] [address]   ABO/Rh     Status: None   Collection Time: 09/02/18  5:26 PM  Result Value Ref Range   ABO/RH(D)      A NEG Performed at [facility] [address] [place], [place] [address]   CBG monitoring, ED     Status: Abnormal   Collection Time: 09/02/18  7:38 PM  Result Value Ref Range   Glucose-Capillary 118 (H) 70 - 99 mg/dL   Comment 1 Notify RN    Comment 2 Document in Chart    Dg Chest 2 View  Result Date: 09/02/2018 CLINICAL DATA:  Shortness of breath EXAM: CHEST - 2 VIEW COMPARISON:  August 23, 2018 FINDINGS: Pleural calcifications are seen bilaterally. Pleural thickening and opacity  in the right base is similar to previous studies. The cardiomediastinal silhouette is stable. No other acute abnormalities. IMPRESSION: 1. Pleural thickening, pleural calcifications, and bibasilar opacities, right greater than  left, are similar since July 17, 2018 and perhaps mildly worsened since August 23, 2018. No other changes. Electronically Signed   By: Dorise Bullion III M.D   On: 09/02/2018 12:54    Pending Labs Unresulted Labs (From admission, onward)    Start     Ordered   09/03/18 0500  Heparin level (unfractionated)  Daily,   R     09/02/18 1600   09/03/18 0500  CBC  Daily,   R     09/02/18 1600   09/03/18 0100  Heparin level (unfractionated)  Once-Timed,   R     09/02/18 1600          Vitals/Pain Today's Vitals   09/02/18 1840 09/02/18 1907 09/02/18 1915 09/02/18 1930  BP: 100/66 113/75 107/63 103/65  Pulse: 80 (!) 108 97 93  Resp: 16 20 13 14   Temp:      TempSrc:      SpO2: 98% 100% 99% 100%  Weight:      Height:      PainSc:        Isolation Precautions No active isolations  Medications Medications  diltiazem (CARDIZEM) 1 mg/mL load via infusion 10 mg (10 mg Intravenous Bolus from Bag 09/02/18 1552)    And  diltiazem (CARDIZEM) 100 mg in dextrose 5% 146mL (1 mg/mL) infusion (5 mg/hr Intravenous New Bag/Given 09/02/18 1551)  acetaminophen-codeine (TYLENOL #3) 300-30 MG per tablet 1 tablet (has no administration in time range)  aspirin tablet 81 mg (81 mg Oral Not Given 09/02/18 1811)  insulin detemir (LEVEMIR) injection 15 Units (has no administration in time range)  dronabinol (MARINOL) capsule 2.5 mg (has no administration in time range)  pantoprazole (PROTONIX) EC tablet 40 mg (40 mg Oral Given 09/02/18 1941)  albuterol (VENTOLIN HFA) 108 (90 Base) MCG/ACT inhaler 2 puff (has no administration in time range)  latanoprost (XALATAN) 0.005 % ophthalmic solution 1 drop (has no administration in time range)  heparin ADULT infusion 100 units/mL (25000 units/245mL sodium chloride 0.45%) (1,500 Units/hr Intravenous New Bag/Given 09/02/18 1646)  insulin aspart (novoLOG) injection 0-9 Units (has no administration in time range)  piperacillin-tazobactam (ZOSYN) IVPB 3.375 g (3.375 g  Intravenous New Bag/Given 09/02/18 1649)  heparin bolus via infusion 5,000 Units (5,000 Units Intravenous Bolus from Bag 09/02/18 1647)    Mobility walks with device Moderate fall risk   Focused Assessments Cardiac Assessment Handoff:  Cardiac Rhythm: Atrial fibrillation Lab Results  Component Value Date   TROPONINI <0.03 09/02/2018   Lab Results  Component Value Date   DDIMER 0.87 (H) 09/02/2018   Does the Patient currently have chest pain? No     R Recommendations: See Admitting Provider Note  Report given to:   Additional Notes:  Patent is pleasant and ready for a more comfortable bed.

## 2018-09-02 NOTE — Telephone Encounter (Signed)
Patient's wife called stating that Roger Blackwell is having problems with SOB especially with walking and heart rate is irregular.  He has a h/o chronic diastolic CHF and CKD stage 3.  She called EMS last night because he was severely SOB with any movement.  EMS documented atrial fibrillation on telemetry at 121bpm BP 128/39mmHg and recommended that he call his Cardiologist this am.    This am the only way he can breathe is sitting in front of the air conditioner and a fan blowing on him.  I instructed her to call 911 to take him to [facility] for further evaluation.

## 2018-09-02 NOTE — ED Notes (Signed)
Titrated the patient's Cardizem to 10 mg because his HR went to 145.

## 2018-09-02 NOTE — ED Notes (Addendum)
Patient transported to X-ray 

## 2018-09-03 ENCOUNTER — Inpatient Hospital Stay (HOSPITAL_COMMUNITY): Payer: Medicare Other

## 2018-09-03 DIAGNOSIS — I130 Hypertensive heart and chronic kidney disease with heart failure and stage 1 through stage 4 chronic kidney disease, or unspecified chronic kidney disease: Secondary | ICD-10-CM | POA: Diagnosis not present

## 2018-09-03 DIAGNOSIS — C3491 Malignant neoplasm of unspecified part of right bronchus or lung: Secondary | ICD-10-CM | POA: Diagnosis not present

## 2018-09-03 DIAGNOSIS — I5032 Chronic diastolic (congestive) heart failure: Secondary | ICD-10-CM | POA: Diagnosis not present

## 2018-09-03 DIAGNOSIS — Z87891 Personal history of nicotine dependence: Secondary | ICD-10-CM | POA: Diagnosis not present

## 2018-09-03 DIAGNOSIS — Z20828 Contact with and (suspected) exposure to other viral communicable diseases: Secondary | ICD-10-CM | POA: Diagnosis present

## 2018-09-03 DIAGNOSIS — R5381 Other malaise: Secondary | ICD-10-CM | POA: Diagnosis present

## 2018-09-03 DIAGNOSIS — I4891 Unspecified atrial fibrillation: Secondary | ICD-10-CM

## 2018-09-03 DIAGNOSIS — Z7982 Long term (current) use of aspirin: Secondary | ICD-10-CM | POA: Diagnosis not present

## 2018-09-03 DIAGNOSIS — N183 Chronic kidney disease, stage 3 (moderate): Secondary | ICD-10-CM | POA: Diagnosis present

## 2018-09-03 DIAGNOSIS — Z79899 Other long term (current) drug therapy: Secondary | ICD-10-CM | POA: Diagnosis not present

## 2018-09-03 DIAGNOSIS — I2510 Atherosclerotic heart disease of native coronary artery without angina pectoris: Secondary | ICD-10-CM | POA: Diagnosis present

## 2018-09-03 DIAGNOSIS — D649 Anemia, unspecified: Secondary | ICD-10-CM | POA: Diagnosis present

## 2018-09-03 DIAGNOSIS — Z833 Family history of diabetes mellitus: Secondary | ICD-10-CM | POA: Diagnosis not present

## 2018-09-03 DIAGNOSIS — E1122 Type 2 diabetes mellitus with diabetic chronic kidney disease: Secondary | ICD-10-CM | POA: Diagnosis present

## 2018-09-03 DIAGNOSIS — Z794 Long term (current) use of insulin: Secondary | ICD-10-CM | POA: Diagnosis not present

## 2018-09-03 DIAGNOSIS — Z955 Presence of coronary angioplasty implant and graft: Secondary | ICD-10-CM | POA: Diagnosis not present

## 2018-09-03 DIAGNOSIS — E785 Hyperlipidemia, unspecified: Secondary | ICD-10-CM | POA: Diagnosis present

## 2018-09-03 DIAGNOSIS — Z8249 Family history of ischemic heart disease and other diseases of the circulatory system: Secondary | ICD-10-CM | POA: Diagnosis not present

## 2018-09-03 LAB — GLUCOSE, CAPILLARY
Glucose-Capillary: 109 mg/dL — ABNORMAL HIGH (ref 70–99)
Glucose-Capillary: 107 mg/dL — ABNORMAL HIGH (ref 70–99)
Glucose-Capillary: 89 mg/dL (ref 70–99)
Glucose-Capillary: 97 mg/dL (ref 70–99)

## 2018-09-03 LAB — CBC
HCT: 33.2 % — ABNORMAL LOW (ref 39.0–52.0)
Hemoglobin: 10.5 g/dL — ABNORMAL LOW (ref 13.0–17.0)
MCH: 25.8 pg — ABNORMAL LOW (ref 26.0–34.0)
MCHC: 31.6 g/dL (ref 30.0–36.0)
MCV: 81.6 fL (ref 80.0–100.0)
Platelets: 263 10*3/uL (ref 150–400)
RBC: 4.07 MIL/uL — ABNORMAL LOW (ref 4.22–5.81)
RDW: 14.7 % (ref 11.5–15.5)
WBC: 12.7 10*3/uL — ABNORMAL HIGH (ref 4.0–10.5)
nRBC: 0 % (ref 0.0–0.2)

## 2018-09-03 LAB — HEPARIN LEVEL (UNFRACTIONATED)
Heparin Unfractionated: 0.39 IU/mL (ref 0.30–0.70)
Heparin Unfractionated: 0.3 IU/mL (ref 0.30–0.70)

## 2018-09-03 LAB — ECHOCARDIOGRAM COMPLETE
Height: 75 in
Weight: 3595.2 oz

## 2018-09-03 MED ORDER — TRAZODONE HCL 50 MG PO TABS
50.0000 mg | ORAL_TABLET | Freq: Once | ORAL | Status: AC
  Administered 2018-09-03: 50 mg via ORAL
  Filled 2018-09-03: qty 1

## 2018-09-03 MED ORDER — DILTIAZEM HCL 60 MG PO TABS
30.0000 mg | ORAL_TABLET | Freq: Four times a day (QID) | ORAL | Status: DC
  Administered 2018-09-03 – 2018-09-04 (×4): 30 mg via ORAL
  Filled 2018-09-03 (×4): qty 1

## 2018-09-03 NOTE — Progress Notes (Signed)
  Echocardiogram 2D Echocardiogram has been performed.  Roger Blackwell 09/03/2018, 2:33 PM

## 2018-09-03 NOTE — Progress Notes (Signed)
ANTICOAGULATION CONSULT NOTE - Follow Up Consult  Pharmacy Consult for heparin Indication: atrial fibrillation  Labs: Recent Labs    09/02/18 1200 09/03/18 0042  HGB 11.0* 10.5*  HCT 35.7* 33.2*  PLT 312 263  HEPARINUNFRC  --  0.39  CREATININE 1.23  --   TROPONINI <0.03  --     Assessment/Plan:  83yo male therapeutic on heparin with initial dosing for Afib. Will continue gtt at current rate and confirm stable with additional level.   Wynona Neat, PharmD, BCPS  09/03/2018,1:46 AM

## 2018-09-03 NOTE — Progress Notes (Signed)
Due to COVID19 crisis and risk of contracting virus with exposure will hold off on 2D echo at this time.  Echo ordered for afib with RVR and now back in NSR.  Trop negative. Will hold off on echo for now until rule out for COVID complete

## 2018-09-03 NOTE — Progress Notes (Signed)
Pt noted to be in NSR- confirmed with EKG

## 2018-09-03 NOTE — Progress Notes (Signed)
[place] for heparin Indication: atrial fibrillation  Allergies  Allergen Reactions  . Codeine Nausea And Vomiting and Other (See Comments)    Made the patient feel badly, also  . Ivp Dye [Iodinated Diagnostic Agents] Other (See Comments)    Blisters all over the body    Patient Measurements: Height: 6\' 3"  (190.5 cm) Weight: 224 lb 11.2 oz (101.9 kg) IBW/kg (Calculated) : 84.5 Heparin Dosing Weight: 105kg  Vital Signs: Temp: 97.7 F (36.5 C) (04/19 0824) Temp Source: Oral (04/19 0824) BP: 114/62 (04/19 0824) Pulse Rate: 85 (04/19 0824)  Labs: Recent Labs    09/02/18 1200 09/03/18 0042 09/03/18 1006  HGB 11.0* 10.5*  --   HCT 35.7* 33.2*  --   PLT 312 263  --   HEPARINUNFRC  --  0.39 0.30  CREATININE 1.23  --   --   TROPONINI <0.03  --   --     Estimated Creatinine Clearance: 62 mL/min (by C-G formula based on SCr of 1.23 mg/dL).   Medical History: Past Medical History:  Diagnosis Date  . CAD (coronary artery disease)   . CHF (congestive heart failure) ([place])   . DM (diabetes mellitus) ([place])   . Dyslipidemia   . Gout   . Morbid obesity ([place])   . Systemic hypertension     Assessment: 82 YOM with new onset Afib, no anticoagulation PTA, H/H chronically low but stable, plts wnl.    Heparin level this morning at low end of goal range.  No overt bleeding or complications noted.  Goal of Therapy:  Heparin level 0.3-0.7 units/ml Monitor platelets by anticoagulation protocol: Yes   Plan:  Increase heparin slightly to 1600 units/hr to keep in goal range. Daily heparin level and CBC.  Marguerite Olea, [facility] Clinical Pharmacist Phone [phone]  09/03/2018 11:18 AM

## 2018-09-03 NOTE — Care Management Obs Status (Signed)
[place] NOTIFICATION   Patient Details  Name: Roger Blackwell MRN: [id number] Date of Birth: 10/06/1935   Medicare Observation Status Notification Given:  Yes    Carles Collet, RN 09/03/2018, 12:43 PM

## 2018-09-03 NOTE — Progress Notes (Signed)
Patient Demographics:    Roger Blackwell, is a 83 y.o. male, DOB - 07-29-1935, UUV:[id number]  Admit date - 09/02/2018   Admitting Physician Costin Karlyne Greenspan, MD  Outpatient Primary MD for the patient is [name], MD  LOS - 0   Chief Complaint  Patient presents with   Shortness of Breath        Subjective:    Roger Blackwell today has no fevers, no emesis,  No chest pain, patient continues to have dizziness, dyspnea and gait concerns  Assessment  & Plan :    Principal Problem:   Atrial fibrillation with RVR ([place]) Active Problems:   CAD s/p left circumflex coronary stents 2002   Chronic diastolic heart failure ([place])   Essential hypertension   Type 2 diabetes mellitus with stage 3 chronic kidney disease, with long-term current use of insulin (HCC)   CKD (chronic kidney disease) stage 3, GFR 30-59 ml/min (HCC)   Necrotizing pneumonia ([place])  Brief Summary:-  83 y.o. male with medical history significant of recently diagnosed squamous cell carcinoma of the lung and just established with oncology, recent history in January 2020 with severe necrotizing right lower lobe pneumonia, h/o CAD with remote PCI, HTN admitted on 09/02/2018 with new onset A. fib with RVR   A/p 1)New Onset Afib with RVR--- rate control is improving, will transition from IV Cardizem to p.o. Cardizem later today if rate control continues to improve, echocardiogram pending, IV heparin, most likely will transition to Eliquis in a.m. if no bleeding.  TSH is 2.0, potassium and magnesium are within normal limits--- however patient remains symptomatic and dizziness, dyspnea and gait concerns persist  2) Dyspnea on exertion/shortness of breath /recent history of necrotizing pneumonia--COVID-19 test negative on 09/02/2018, CT chest without new pneumonia--not currently requiring oxygen  3) squamous cell carcinoma of the right lung with  cavitary lesion----follow-up with radiation oncology as outpatient, recently had PET scan as outpatient  4)DM2-recent A1c 5.9 reflecting excellent control, continue Levemir 15 units, along with sliding scale coverage  5) generalized weakness/debility-- dizziness, dyspnea and gait concerns persist, get PT eval  Disposition/Need for in-Hospital Stay- patient unable to be discharged at this time due to A. fib with RVR with dizziness, dyspnea and gait concerns  Code Status : Full  Family Communication:   na   Disposition Plan  : pending PT eval and improved rate control  Consults  :  na  DVT Prophylaxis  :  Iv heparin  Lab Results  Component Value Date   PLT 263 09/03/2018    Inpatient Medications  Scheduled Meds:  aspirin EC  81 mg Oral Daily   diltiazem  30 mg Oral Q6H   insulin aspart  0-9 Units Subcutaneous TID WC   insulin detemir  15 Units Subcutaneous Daily   latanoprost  1 drop Both Eyes QHS   pantoprazole  40 mg Oral Daily   Continuous Infusions:  diltiazem (CARDIZEM) infusion 12.5 mg/hr (09/03/18 0930)   heparin 1,600 Units/hr (09/03/18 1151)   piperacillin-tazobactam (ZOSYN)  IV 3.375 g (09/03/18 1310)   PRN Meds:.acetaminophen-codeine, albuterol    Anti-infectives (From admission, onward)   Start     Dose/Rate Route Frequency Ordered Stop   09/02/18 1615  piperacillin-tazobactam (ZOSYN) IVPB 3.375  g     3.375 g 12.5 mL/hr over 240 Minutes Intravenous Every 8 hours 09/02/18 1611          Objective:   Vitals:   09/02/18 2058 09/03/18 0500 09/03/18 0824 09/03/18 1210  BP: 109/68 (!) 111/59 114/62 115/67  Pulse: 100 76 85 75  Resp: 15 18 18 17   Temp: 98.3 F (36.8 C) 98.7 F (37.1 C) 97.7 F (36.5 C) 98.3 F (36.8 C)  TempSrc: Oral Oral Oral Oral  SpO2: 90%  100% 100%  Weight: 101.9 kg 101.9 kg    Height: 6\' 3"  (1.905 m)       Wt Readings from Last 3 Encounters:  09/03/18 101.9 kg  08/30/18 112.5 kg  08/23/18 113.4 kg      Intake/Output Summary (Last 24 hours) at 09/03/2018 1328 Last data filed at 09/03/2018 0400 Gross per 24 hour  Intake 766.63 ml  Output --  Net 766.63 ml     Physical Exam Patient is examined daily including today on 09/03/18 , exams remain the same as of yesterday except that has changed   Gen:- Awake Alert,  In no apparent distress  HEENT:- [place].AT, No sclera icterus Neck-Supple Neck,No JVD,.  Lungs-mostly clear, fair symmetrical air movement CV- S1, S2 normal, irregularly irregular Abd-  +ve B.Sounds, Abd Soft, No tenderness,    Extremity/Skin:- No  edema, pedal pulses present  Psych-affect is appropriate, oriented x3 Neuro-generalized weakness, no new focal deficits, no tremors   Data Review:   Micro Results Recent Results (from the past 240 hour(s))  SARS Coronavirus 2 [facility] order, Performed in [facility] lab)     Status: None   Collection Time: 09/02/18  3:50 PM  Result Value Ref Range Status   SARS Coronavirus 2 NEGATIVE NEGATIVE Final    Comment: (NOTE) If result is NEGATIVE SARS-CoV-2 target nucleic acids are NOT DETECTED. The SARS-CoV-2 RNA is generally detectable in upper and lower  respiratory specimens during the acute phase of infection. The lowest  concentration of SARS-CoV-2 viral copies this assay can detect is 250  copies / mL. A negative result does not preclude SARS-CoV-2 infection  and should not be used as the sole basis for treatment or other  patient management decisions.  A negative result may occur with  improper specimen collection / handling, submission of specimen other  than nasopharyngeal swab, presence of viral mutation(s) within the  areas targeted by this assay, and inadequate number of viral copies  (<250 copies / mL). A negative result must be combined with clinical  observations, patient history, and epidemiological information. If result is POSITIVE SARS-CoV-2 target nucleic acids are DETECTED. The SARS-CoV-2 RNA is  generally detectable in upper and lower  respiratory specimens dur ing the acute phase of infection.  Positive  results are indicative of active infection with SARS-CoV-2.  Clinical  correlation with patient history and other diagnostic information is  necessary to determine patient infection status.  Positive results do  not rule out bacterial infection or co-infection with other viruses. If result is PRESUMPTIVE POSTIVE SARS-CoV-2 nucleic acids MAY BE PRESENT.   A presumptive positive result was obtained on the submitted specimen  and confirmed on repeat testing.  While 2019 novel coronavirus  (SARS-CoV-2) nucleic acids may be present in the submitted sample  additional confirmatory testing may be necessary for epidemiological  and / or clinical management purposes  to differentiate between  SARS-CoV-2 and other Sarbecovirus currently known to infect humans.  If clinically indicated  additional testing with an alternate test  methodology [phone]) is advised. The SARS-CoV-2 RNA is generally  detectable in upper and lower respiratory sp ecimens during the acute  phase of infection. The expected result is Negative. Fact Sheet for Patients:  StrictlyIdeas.no Fact Sheet for Healthcare Providers: BankingDealers.co.za This test is not yet approved or cleared by the [place] FDA and has been authorized for detection and/or diagnosis of SARS-CoV-2 by FDA under an Emergency Use Authorization (EUA).  This EUA will remain in effect (meaning this test can be used) for the duration of the COVID-19 declaration under Section 564(b)(1) of the Act, 21 U.S.C. section 360bbb-3(b)(1), unless the authorization is terminated or revoked sooner. Performed at [facility], [place] [address], [place], [place] [address]     Radiology Reports Dg Chest 2 View  Result Date: 09/02/2018 CLINICAL DATA:  Shortness of breath EXAM: CHEST - 2 VIEW COMPARISON:   August 23, 2018 FINDINGS: Pleural calcifications are seen bilaterally. Pleural thickening and opacity in the right base is similar to previous studies. The cardiomediastinal silhouette is stable. No other acute abnormalities. IMPRESSION: 1. Pleural thickening, pleural calcifications, and bibasilar opacities, right greater than left, are similar since July 17, 2018 and perhaps mildly worsened since August 23, 2018. No other changes. Electronically Signed   By: Dorise Bullion III M.D   On: 09/02/2018 12:54   Ct Chest Wo Contrast  Result Date: 09/02/2018 CLINICAL DATA:  Increasing shortness of breath over the past 2 days, known history of lung carcinoma on the right, productive cough EXAM: CT CHEST WITHOUT CONTRAST TECHNIQUE: Multidetector CT imaging of the chest was performed following the standard protocol without IV contrast. COMPARISON:  PET-CT from 07/10/2018, CT of the chest from 05/18/2018 FINDINGS: Cardiovascular: Limited due to lack of IV contrast. Aortic calcifications are noted without aneurysmal dilatation. No cardiac enlargement is seen. Coronary calcifications are noted. Mediastinum/Nodes: Thoracic inlet is within normal limits. Stable precarinal lymph node is noted. Persistent soft tissue density in the right hilar region surrounding the bronchus intermedius is noted when compared with the prior exam. This is consistent with hilar adenopathy and local extension of neoplasm. Some fullness in the subcarinal region is noted as well. Lungs/Pleura: Calcified pleural plaques are again noted and stable. There is again seen and extension cavitary lesion within the right lower lobe. The degree of cavitation is relatively stable from the prior PET-CT although the degree of fluid within the cavity has decreased. The more rounded inferior component recently biopsied is again identified with decreased cavitation and more fluid within. Some associated nodularity is noted likely related to local spread. No sizable  effusion is seen. Air is noted within the pleural space on the right relatively stable from the recent PET-CT. Stable nodular density is noted in the left upper lobe best seen on image number 53 of series 6. Stable pleural plaquing is noted as well. Upper Abdomen: Visualized upper abdomen is within normal limits. Evaluation is somewhat limited again due to the lack of IV contrast. Musculoskeletal: Degenerative changes of the thoracic spine are noted. No lytic or sclerotic lesion is identified. IMPRESSION: Stable appearing cavitary lesion within the right lower lobe similar to that seen on prior PET-CT consistent with the known history of squamous cell carcinoma. Persistent right hilar density is noted surrounding the bronchus intermedius and lower lobe bronchial tree consistent with progressive neoplastic involvement. Stable subcarinal involvement is noted as well. Changes consistent with prior asbestos exposure. Persistent air is noted within the pleural  space on the right similar to that seen on prior PET-CT. No new focal abnormality is noted. Electronically Signed   By: Inez Catalina M.D.   On: 09/02/2018 22:52   Ct Biopsy  Result Date: 08/23/2018 CLINICAL DATA:  indolent persistent right lower lobe pneumonia not responsive to antibiotics. Previous bronchoscopy has been unrevealing. Recent PET scan shows a more discrete solid mass 3.5 cm posteriorly with hypermetabolic activity on PET scan. Pleural disease has slightly increased but doubt chronic empyema as the source of the patient's illness. Biopsy requested. EXAM: CT GUIDED CORE BIOPSY OF RIGHT LOWER LOBE LUNG LESION ANESTHESIA/SEDATION: Intravenous Fentanyl 13mcg and Versed 1.5mg  were administered as conscious sedation during continuous monitoring of the patient's level of consciousness and physiological / cardiorespiratory status by the radiology RN, with a total moderate sedation time of 11 minutes. PROCEDURE: The procedure risks, benefits, and  alternatives were explained to the patient. Questions regarding the procedure were encouraged and answered. The patient understands and consents to the procedure. Patient placed prone. Select axial scans through the thorax obtained. The right lower lobe lesion was localized and an appropriate skin entry site was determined and marked. The operative field was prepped with chlorhexidinein a sterile fashion, and a sterile drape was applied covering the operative field. A sterile gown and sterile gloves were used for the procedure. Local anesthesia was provided with 1% Lidocaine. Under CT fluoroscopic guidance, a 17 gauge trocar needle was advanced to the margin of the lesion. Once needle tip position was confirmed, coaxial 18-gauge core biopsy samples were obtained, submitted in formalin to surgical pathology. The guide needle was removed. Postprocedure scans show no regional alveolar hemorrhage or pneumothorax. The patient tolerated the procedure well. COMPLICATIONS: None immediate FINDINGS: The hypermetabolic posterior right lower lobe lesion was localized. Representative core biopsy samples obtained as above. IMPRESSION: 1. Technically successful CT-guided core biopsy, right lower lobe mass. Electronically Signed   By: Lucrezia Europe M.D.   On: 08/23/2018 12:36   Dg Chest Port 1 View  Result Date: 08/23/2018 CLINICAL DATA:  Status post right lung biopsy EXAM: PORTABLE CHEST 1 VIEW COMPARISON:  07/17/2018 FINDINGS: Cardiac shadow is stable. Pleural thickening is noted inferiorly on the right stable from the previous exam. The cavitary lesion is less well appreciated on today's exam. No definitive pneumothorax is seen. Calcified pleural plaques are noted. IMPRESSION: No evidence of post biopsy pneumothorax. Electronically Signed   By: Inez Catalina M.D.   On: 08/23/2018 14:10     CBC Recent Labs  Lab 09/02/18 1200 09/03/18 0042  WBC 13.8* 12.7*  HGB 11.0* 10.5*  HCT 35.7* 33.2*  PLT 312 263  MCV 83.0 81.6    MCH 25.6* 25.8*  MCHC 30.8 31.6  RDW 14.7 14.7  LYMPHSABS 1.9  --   MONOABS 1.0  --   EOSABS 0.4  --   BASOSABS 0.1  --     Chemistries  Recent Labs  Lab 09/02/18 1200  NA 133*  K 4.5  CL 99  CO2 21*  GLUCOSE 146*  BUN 13  CREATININE 1.23  CALCIUM 9.9  MG 1.8  AST 18  ALT 13  ALKPHOS 62  BILITOT 0.7   ------------------------------------------------------------------------------------------------------------------ No results for input(s): CHOL, HDL, LDLCALC, TRIG, CHOLHDL, LDLDIRECT in the last 72 hours.  Lab Results  Component Value Date   HGBA1C 5.9 (H) 05/19/2018   ------------------------------------------------------------------------------------------------------------------ Recent Labs    09/02/18 1323  TSH 2.007   ------------------------------------------------------------------------------------------------------------------ No results for input(s): VITAMINB12, FOLATE, FERRITIN, TIBC,  IRON, RETICCTPCT in the last 72 hours.  Coagulation profile No results for input(s): INR, PROTIME in the last 168 hours.  Recent Labs    09/02/18 1200  DDIMER 0.87*    Cardiac Enzymes Recent Labs  Lab 09/02/18 1200  TROPONINI <0.03   ------------------------------------------------------------------------------------------------------------------ No results found for: BNP  Roxan Hockey M.D on 09/03/2018 at 1:28 PM  Go to [email or web address] - for contact info  [facility] - Office  [phone]

## 2018-09-03 NOTE — Progress Notes (Addendum)
Per Roxan Hockey, MD, started card PO while card gtt running at 12.5. two hours after PO given, tiltrate card gtt down to 5, run for another 2 hrs, then turn it off.

## 2018-09-04 LAB — CBC
HCT: 28.3 % — ABNORMAL LOW (ref 39.0–52.0)
Hemoglobin: 8.7 g/dL — ABNORMAL LOW (ref 13.0–17.0)
MCH: 25.1 pg — ABNORMAL LOW (ref 26.0–34.0)
MCHC: 30.7 g/dL (ref 30.0–36.0)
MCV: 81.6 fL (ref 80.0–100.0)
Platelets: 243 10*3/uL (ref 150–400)
RBC: 3.47 MIL/uL — ABNORMAL LOW (ref 4.22–5.81)
RDW: 14.8 % (ref 11.5–15.5)
WBC: 10.7 10*3/uL — ABNORMAL HIGH (ref 4.0–10.5)
nRBC: 0 % (ref 0.0–0.2)

## 2018-09-04 LAB — GLUCOSE, CAPILLARY
Glucose-Capillary: 92 mg/dL (ref 70–99)
Glucose-Capillary: 102 mg/dL — ABNORMAL HIGH (ref 70–99)

## 2018-09-04 LAB — HEPARIN LEVEL (UNFRACTIONATED): Heparin Unfractionated: 0.43 IU/mL (ref 0.30–0.70)

## 2018-09-04 MED ORDER — MIRTAZAPINE 15 MG PO TABS: 15.0000 mg | ORAL_TABLET | Freq: Every day | ORAL | 2 refills | Status: AC

## 2018-09-04 MED ORDER — DILTIAZEM HCL ER COATED BEADS 120 MG PO CP24
120.0000 mg | ORAL_CAPSULE | Freq: Every day | ORAL | Status: DC
  Administered 2018-09-04: 120 mg via ORAL
  Filled 2018-09-04: qty 1

## 2018-09-04 MED ORDER — LOSARTAN POTASSIUM 50 MG PO TABS: 50.0000 mg | ORAL_TABLET | Freq: Every day | ORAL | 3 refills | Status: DC

## 2018-09-04 MED ORDER — INSULIN LISPRO (1 UNIT DIAL) 100 UNIT/ML (KWIKPEN): 3.0000 [IU] | PEN_INJECTOR | SUBCUTANEOUS | 11 refills | Status: DC

## 2018-09-04 MED ORDER — DILTIAZEM HCL ER COATED BEADS 120 MG PO CP24: 120.0000 mg | ORAL_CAPSULE | Freq: Every day | ORAL | 2 refills | Status: DC

## 2018-09-04 MED ORDER — FUROSEMIDE 40 MG PO TABS: 40.0000 mg | ORAL_TABLET | Freq: Every day | ORAL | 0 refills | Status: DC | PRN

## 2018-09-04 MED ORDER — APIXABAN 5 MG PO TABS
5.0000 mg | ORAL_TABLET | Freq: Two times a day (BID) | ORAL | Status: DC
  Administered 2018-09-04: 5 mg via ORAL
  Filled 2018-09-04: qty 1

## 2018-09-04 MED ORDER — APIXABAN 5 MG PO TABS: 5.0000 mg | ORAL_TABLET | Freq: Two times a day (BID) | ORAL | 4 refills | Status: AC

## 2018-09-04 MED ORDER — LEVEMIR FLEXTOUCH 100 UNIT/ML ~~LOC~~ SOPN: 18.0000 [IU] | PEN_INJECTOR | SUBCUTANEOUS | 11 refills | Status: DC

## 2018-09-04 MED FILL — MIRTAZAPINE 15 MG TABLET: 15 | 30 days supply | Qty: 30 | Fill #0 | Status: TO

## 2018-09-04 MED FILL — LEVEMIR FLEXTOUCH 100 UNITS: 100 | 30 days supply | Qty: 6 | Fill #0 | Status: TO

## 2018-09-04 MED FILL — ELIQUIS 5 MG TABLET: 5 | 30 days supply | Qty: 60 | Fill #0 | Status: TO

## 2018-09-04 MED FILL — CARTIA XT 120 MG CP24: 120 | 30 days supply | Qty: 30 | Fill #0 | Status: TO

## 2018-09-04 MED FILL — FUROSEMIDE 40 MG TABLET: 40 | 30 days supply | Qty: 30 | Fill #0

## 2018-09-04 MED FILL — LOSARTAN POTASSIUM 50 MG TA: 50 | 90 days supply | Qty: 90 | Fill #0 | Status: TO

## 2018-09-04 NOTE — Evaluation (Signed)
Physical Therapy Evaluation Patient Details Name: Roger Blackwell MRN: [id number] DOB: 06-Aug-1935 Today's Date: 09/04/2018   History of Present Illness  83 y.o. male with medical history significant of recently diagnosed squamous cell carcinoma of the lung and just established with oncology, recent history in January 2020 with severe necrotizing right lower lobe pneumonia probably from aspiration, coronary artery disease status post remote PCI, hypertension, diastolic CHF, presents with chief complaint of shortness of breath.In ED found to be in A. fib with RVR which is new onset.  Clinical Impression  PTA pt living with wife in single story home with 4 steps to enter and was independent with ambulation with cane when SoB and without AD when not having difficulty with breathing. Pt is currently mod I for bed mobility, supervision for transfers and hands on min guard with ambulation of 80 feet with RW. Pt requires 1x standing rest break, however maintained SaO2 >90%O2 throughout. PT recommends HHPT level rehab at d/c to improve DME training and to improve strength and endurance. PT will continue to follow acutely.     Follow Up Recommendations Home health PT;Supervision/Assistance - 24 hour    Equipment Recommendations  None recommended by PT       Precautions / Restrictions Precautions Precautions: Fall Precaution Comments: hx of falling Restrictions Weight Bearing Restrictions: No      Mobility  Bed Mobility Overal bed mobility: Modified Independent             General bed mobility comments: increased time and effort with HoB flat  Transfers Overall transfer level: Needs assistance Equipment used: Rolling walker (2 wheeled) Transfers: Sit to/from Stand Sit to Stand: Min guard         General transfer comment: min guard for safety, pt able to stand before reaching to RW however requires posterior assist on LE to come to fully upright  Ambulation/Gait Ambulation/Gait  assistance: Min guard Gait Distance (Feet): 80 Feet Assistive device: Rolling walker (2 wheeled) Gait Pattern/deviations: Step-through pattern;Trunk flexed Gait velocity: slowed Gait velocity interpretation: 1.31 - 2.62 ft/sec, indicative of limited community ambulator General Gait Details: hands on min guard for safety, pt has not used RW previously so educated on need for upright posture and hands by hips on RW, pt with increased ambulation velocity as he gets SoB, educated on need to slow down and even stop to catch his breath before continuing ambulatin        Balance Overall balance assessment: Needs assistance Sitting-balance support: Feet supported;No upper extremity supported Sitting balance-Leahy Scale: Fair     Standing balance support: No upper extremity supported Standing balance-Leahy Scale: Fair Standing balance comment: able to steady in upright prior to reaching to RW                             Pertinent Vitals/Pain Pain Assessment: No/denies pain    Home Living Family/patient expects to be discharged to:: Private residence Living Arrangements: Spouse/significant other Available Help at Discharge: Family;Available 24 hours/day Type of Home: House Home Access: Stairs to enter Entrance Stairs-Rails: Right;Left;Can reach both Entrance Stairs-Number of Steps: 4 Home Layout: One level Home Equipment: Walker - 2 wheels;Cane - single point      Prior Function Level of Independence: Independent with assistive device(s)         Comments: sometimes uses cane for ambulation when SoB, independent with iADLs        Extremity/Trunk Assessment   Upper Extremity Assessment  Upper Extremity Assessment: Overall WFL for tasks assessed    Lower Extremity Assessment Lower Extremity Assessment: Overall WFL for tasks assessed    Cervical / Trunk Assessment Cervical / Trunk Assessment: Kyphotic  Communication   Communication: No difficulties  Cognition  Arousal/Alertness: Awake/alert Behavior During Therapy: WFL for tasks assessed/performed Overall Cognitive Status: Within Functional Limits for tasks assessed                                        General Comments General comments (skin integrity, edema, etc.): at rest SaO2 on RA 98%O2, HR 87bpm, with ambulation SaO2 >90%O2 and HR max 110bpm, pt with 4/4 DoE requiring 1x standing rest break        Assessment/Plan    PT Assessment Patient needs continued PT services  PT Problem List Decreased strength;Decreased mobility;Decreased knowledge of use of DME;Decreased safety awareness;Cardiopulmonary status limiting activity       PT Treatment Interventions DME instruction;Gait training;Stair training;Functional mobility training;Therapeutic activities;Therapeutic exercise;Balance training;Cognitive remediation;Patient/family education    PT Goals (Current goals can be found in the Care Plan section)  Acute Rehab PT Goals Patient Stated Goal: go home today PT Goal Formulation: With patient Time For Goal Achievement: 09/18/18 Potential to Achieve Goals: Good    Frequency Min 3X/week    AM-PAC PT "6 Clicks" Mobility  Outcome Measure Help needed turning from your back to your side while in a flat bed without using bedrails?: None Help needed moving from lying on your back to sitting on the side of a flat bed without using bedrails?: None Help needed moving to and from a bed to a chair (including a wheelchair)?: None Help needed standing up from a chair using your arms (e.g., wheelchair or bedside chair)?: None Help needed to walk in hospital room?: A Little Help needed climbing 3-5 steps with a railing? : A Lot 6 Click Score: 21    End of Session Equipment Utilized During Treatment: Gait belt Activity Tolerance: Patient limited by fatigue Patient left: in bed;with call bell/phone within reach Nurse Communication: Mobility status PT Visit Diagnosis: Unsteadiness  on feet (R26.81);Other abnormalities of gait and mobility (R26.89);Muscle weakness (generalized) (M62.81);History of falling (Z91.81);Difficulty in walking, not elsewhere classified (R26.2)    Time: 8182-9937 PT Time Calculation (min) (ACUTE ONLY): 23 min   Charges:   PT Evaluation $PT Eval Moderate Complexity: 1 Mod PT Treatments $Gait Training: 8-22 mins        Kerby Borner B. Migdalia Dk PT, DPT Acute Rehabilitation Services Pager [phone] Office [phone]   [place] 09/04/2018, 12:01 PM

## 2018-09-04 NOTE — Progress Notes (Addendum)
[place] for heparin Indication: atrial fibrillation  Allergies  Allergen Reactions  . Codeine Nausea And Vomiting and Other (See Comments)    Made the patient feel badly, also  . Ivp Dye [Iodinated Diagnostic Agents] Other (See Comments)    Blisters all over the body    Patient Measurements: Height: 6\' 3"  (190.5 cm) Weight: 228 lb 13.4 oz (103.8 kg) IBW/kg (Calculated) : 84.5 Heparin Dosing Weight: 105kg  Vital Signs: Temp: 97.6 F (36.4 C) (04/20 0757) Temp Source: Oral (04/20 0757) BP: 111/60 (04/20 0757) Pulse Rate: 69 (04/20 0757)  Labs: Recent Labs    09/02/18 1200 09/03/18 0042 09/03/18 1006 09/04/18 0640  HGB 11.0* 10.5*  --  8.7*  HCT 35.7* 33.2*  --  28.3*  PLT 312 263  --  243  HEPARINUNFRC  --  0.39 0.30 0.43  CREATININE 1.23  --   --   --   TROPONINI <0.03  --   --   --     Estimated Creatinine Clearance: 62.5 mL/min (by C-G formula based on SCr of 1.23 mg/dL).   Medical History: Past Medical History:  Diagnosis Date  . CAD (coronary artery disease)   . CHF (congestive heart failure) ([place])   . DM (diabetes mellitus) ([place])   . Dyslipidemia   . Gout   . Morbid obesity ([place])   . Systemic hypertension     Assessment: 82 YOM with new onset Afib, no anticoagulation PTA, H/H chronically low but stable, plts wnl.  Plans noted for apixaban -heparin level at goal -hg 10.5>>8.7 (no bleeding noted)  Goal of Therapy:  Heparin level 0.3-0.7 units/ml Monitor platelets by anticoagulation protocol: Yes   Plan:  No heparin changes needed Daily heparin level and CBC.  Hildred Laser, PharmD Clinical Pharmacist **Pharmacist phone directory can now be found on [email or web address] ([email or web address]).  Listed under [place].   Addendum -changing to apixaban  Plan -discontinue heparin  -apixaban 5mg  po bid -Could consider d/c aspirin  Hildred Laser, PharmD Clinical Pharmacist **Pharmacist phone directory can now be found on  [email or web address] ([email or web address]).  Listed under [place].

## 2018-09-04 NOTE — TOC Transition Note (Addendum)
Transition of Care [facility]) - CM/SW Discharge Note   Patient Details  Name: Roger Blackwell MRN: [id number] Date of Birth: 1935/09/19  Transition of Care [facility]) CM/SW Contact:  Bethena Roys, RN Phone Number: 09/04/2018, 12:15 PM   Clinical Narrative: Pt presented for Atrial Fib RVR- PTA from home with spouse. CM offered choice for [place] with Medicare.gov list. Patient chose [facility]- referral made with [facility] and [facility] to begin within 24-48 hours post transition home. Eliquis 30 day free card provided. No further needs from CM at this time.    Final next level of care: [place] Barriers to Discharge: No Barriers Identified   Patient Goals and CMS Choice Patient states their goals for this hospitalization and ongoing recovery are:: (to get back home ) CMS Medicare.gov Compare Post Acute Care list provided to:: Patient Choice offered to / list presented to : Patient  Discharge Placement                       Discharge Plan and Services In-house Referral: NA Discharge Planning Services: CM Consult Post Acute Care Choice: Home Health          DME Arranged: N/A DME Agency: NA HH Arranged: PT [place] Agency: [facility]   Social Determinants of Health (SDOH) Interventions     Readmission Risk Interventions No flowsheet data found.

## 2018-09-04 NOTE — Discharge Summary (Signed)
Roger Blackwell, is a 83 y.o. male  DOB [birth year]  MRN [id number].  Admission date:  09/02/2018  Admitting Physician  Costin Karlyne Greenspan, MD  Discharge Date:  09/04/2018   Primary MD  [name], MD  Recommendations for primary care physician for things to follow:   1)Squamous cell carcinoma of the right lung with cavitary lesion----follow-up with radiation oncology as outpatient,   2)Take Eliquis/apixaban which is a blood thinner to prevent strokes 3)Since you are taking Apixaban/Eliquis which is a blood thinner please... Avoid ibuprofen/Advil/Aleve/Motrin/Goody Powders/Naproxen/BC powders/Meloxicam/Diclofenac/Indomethacin and other Nonsteroidal anti-inflammatory medications as these will make you more likely to bleed and can cause stomach ulcers, can also cause Kidney problems. 4) your blood pressure medications and insulin dosage has been adjusted 5)Primary care physician for repeat CBC and BMP blood test within a week  Admission Diagnosis  Atrial fibrillation with rapid ventricular response ([place]) [I48.91]   Discharge Diagnosis  Atrial fibrillation with rapid ventricular response ([place]) [I48.91]    Principal Problem:   Atrial fibrillation with RVR ([place]) Active Problems:   CAD s/p left circumflex coronary stents 2002   Chronic diastolic heart failure ([place])   Essential hypertension   Type 2 diabetes mellitus with stage 3 chronic kidney disease, with long-term current use of insulin (HCC)   CKD (chronic kidney disease) stage 3, GFR 30-59 ml/min (HCC)   Necrotizing pneumonia (HCC)      Past Medical History:  Diagnosis Date   CAD (coronary artery disease)    CHF (congestive heart failure) (HCC)    DM (diabetes mellitus) ([place])    Dyslipidemia    Gout    Morbid obesity ([place])    Systemic hypertension     Past Surgical History:  Procedure Laterality Date   BACK SURGERY     CORONARY  ANGIOPLASTY WITH STENT PLACEMENT  12/26/2000   80% prox. circumflex lesion w/successful stent placement.   NM MYOVIEW LTD  08/31/2010   Normal   PERICARDIOCENTESIS     PROSTATE SURGERY     US ECHOCARDIOGRAPHY  08/31/2010   EF 50-55%,RV mildly dilated,mild aortic root dilatation   VIDEO BRONCHOSCOPY Bilateral 05/23/2018   Procedure: VIDEO BRONCHOSCOPY WITHOUT FLUORO;  Surgeon: Marshell Garfinkel, MD;  Location: [facility] ENDOSCOPY;  Service: Cardiopulmonary;  Laterality: Bilateral;     HPI  from the history and physical done on the day of admission:     Chief Complaint: Shortness of breath  HPI: Roger Blackwell is a 83 y.o. male with medical history significant of recently diagnosed squamous cell carcinoma of the lung and just established with oncology, recent history in January 2020 with severe necrotizing right lower lobe pneumonia probably from aspiration, coronary artery disease status post remote PCI, hypertension, diastolic CHF, presents with chief complaint of shortness of breath.  Patient tells me that his shortness of breath started yesterday.  He has been having intermittent shortness of breath since his necrotizing pneumonia but it is gotten significantly worse in the last 24 hours.  Patient also reports intermittent fevers over the last several weeks, he is  a poor historian but thinks that he has been having on and off fevers since January.  He also reports a cough which is chronic in nature and feels like it is gotten a little bit worse also with sputum production.  He denies any flulike illness.  He is also been complaining of a sore throat in the setting of his cough.  He denies any chest pain.  He denies any palpitations.  He has no abdominal pain, nausea or vomiting.  ED Course: In the ED patient was found to be in A. fib with RVR which is new onset for him.  He is afebrile here, satting well on room air.  His blood work shows mild leukocytosis with a white count of 13.8 but otherwise  fairly unremarkable.  He was placed on Cardizem infusion and we are asked to admit   Hospital Course:    Brief Summary:- 83 y.o.malewith medical history significant ofrecently diagnosed squamous cell carcinoma of the lung and just established with oncology, recent history in January 2020 with severe necrotizing right lower lobe pneumonia, h/o CAD with remote PCI, HTN admitted on 09/02/2018 with new onset A. fib with RVR   A/p 1)New Onset Afib with RVR---  Rate control improved on IV Cardizem, patient converted back to sinus rhythm was switched to p.o. Cardizem .  Echo with preserved EF 55 to 28%, with diastolic dysfunction but no regional wall motion abnormalities.    TSH is 2.0, potassium and magnesium are within normal limits---   CHA2DS2- VASc score   is = 5 (age x 2, HTN, DM, dCHF), Which is  equal to =  7.2 % annual risk of stroke  This patients CHA2DS2-VASc Score and unadjusted Ischemic Stroke Rate (% per year) is equal to 7.2 % stroke rate/year from a score of 5  Treated initially with IV heparin, being discharged home on p.o. Eliquis for stroke prophylaxis  2)Dyspnea on exertion/shortness of breath/recent history of necrotizing pneumonia--COVID-19 test negative on 09/02/2018, CT chest without new pneumonia--dyspnea   has resolved, no hypoxia,  Echo with preserved EF 55 to 36%, with diastolic dysfunction but no regional wall motion abnormalities  3) squamous cell carcinoma of the right lung with cavitary lesion----follow-up with radiation oncology as outpatient, recently had PET scan as outpatient  4)DM2-recent A1c 5.9 reflecting excellent control, patient lost significant amount of weight recently so he is at risk for hypoglycemia,  insulin has been decreased to 18 units per his protocol from 25 , NovoLog insulin with meals has been decreased to 3 units from 5 units per patient protocol  5)Acute on chronic anemia--- globin is down to 8.7, patient is now on Eliquis with increased  risk for bleeding,  no bleeding noted at this time repeat CBC and BMP within a week with PCP advised  6)Generalized weakness/debility--   PT eval appreciated, they recommend home health physical therapy  Code Status : Full  Family Communication:   na   Disposition Plan  : pending PT eval and improved rate control  Discharge Condition: stable  Follow UP--- PCP within a week  [place] up.   Specialty:  Home Health Services Why:  Physical Therapy-Office to call to schedule visit.  Contact information: [place] [address] [phone]           Diet and Activity recommendation:  As advised  Discharge Instructions    Discharge Instructions  Call MD for:  difficulty breathing, headache or visual disturbances   Complete by:  As directed    Call MD for:  persistant dizziness or light-headedness   Complete by:  As directed    Call MD for:  persistant nausea and vomiting   Complete by:  As directed    Call MD for:  temperature >100.4   Complete by:  As directed    Diet - low sodium heart healthy   Complete by:  As directed    Diet Carb Modified   Complete by:  As directed    Discharge instructions   Complete by:  As directed    1)Squamous cell carcinoma of the right lung with cavitary lesion----follow-up with radiation oncology as outpatient,   2)Take Eliquis/apixaban which is a blood thinner to prevent strokes 3)Since you are taking Apixaban/Eliquis which is a blood thinner please... Avoid ibuprofen/Advil/Aleve/Motrin/Goody Powders/Naproxen/BC powders/Meloxicam/Diclofenac/Indomethacin and other Nonsteroidal anti-inflammatory medications as these will make you more likely to bleed and can cause stomach ulcers, can also cause Kidney problems. 4) your blood pressure medications and insulin dosage has been adjusted 5)Primary care physician for repeat CBC and BMP blood test within a week     Increase activity slowly   Complete by:  As directed        Discharge Medications     Allergies as of 09/04/2018      Reactions   Codeine Nausea And Vomiting, Other (See Comments)   Made the patient feel badly, also   Ivp Dye [iodinated Diagnostic Agents] Other (See Comments)   Blisters all over the body      Medication List    STOP taking these medications   aspirin 81 MG tablet   cephALEXin 500 MG capsule Commonly known as:  KEFLEX   dronabinol 2.5 MG capsule Commonly known as:  Marinol   pantoprazole 40 MG tablet Commonly known as:  PROTONIX   vitamin B-12 500 MCG tablet Commonly known as:  CYANOCOBALAMIN     TAKE these medications   albuterol 108 (90 Base) MCG/ACT inhaler Commonly known as:  VENTOLIN HFA Inhale 2 puffs into the lungs every 6 (six) hours as needed for wheezing or shortness of breath.   allopurinol 100 MG tablet Commonly known as:  ZYLOPRIM Take 300 mg by mouth daily.   apixaban 5 MG Tabs tablet Commonly known as:  ELIQUIS Take 1 tablet (5 mg total) by mouth 2 (two) times daily.   diltiazem 120 MG 24 hr capsule Commonly known as:  CARDIZEM CD Take 1 capsule (120 mg total) by mouth daily. Start taking on:  September 05, 2018   furosemide 40 MG tablet Commonly known as:  LASIX Take 1 tablet (40 mg total) by mouth daily as needed for fluid or edema. What changed:    medication strength  how much to take  when to take this  reasons to take this   insulin lispro 100 UNIT/ML KwikPen Commonly known as:  HumaLOG KwikPen Inject 0.03 mLs (3 Units total) into the skin See admin instructions. Inject 3 units into the skin with meals as needed for a BGL reading of 150 or greater What changed:    how much to take  additional instructions   iron polysaccharides 150 MG capsule Commonly known as:  NIFEREX Take 150 mg by mouth every evening.   Levemir FlexTouch 100 UNIT/ML Pen Generic drug:  Insulin Detemir Inject 18 Units into the skin  See admin instructions. Inject 18 units into the skin at  bedtime if BGL is 170-180 or greater What changed:    how much to take  additional instructions   losartan 50 MG tablet Commonly known as:  COZAAR Take 1 tablet (50 mg total) by mouth daily. What changed:    medication strength  how much to take   mirtazapine 15 MG tablet Commonly known as:  Remeron Take 1 tablet (15 mg total) by mouth at bedtime.   Potassium 99 MG Tabs Take 99 mg by mouth every other day.   Travoprost (BAK Free) 0.004 % Soln ophthalmic solution Commonly known as:  TRAVATAN Place 1 drop into both eyes at bedtime.       Major procedures and Radiology Reports - PLEASE review detailed and final reports for all details, in brief -   Dg Chest 2 View  Result Date: 09/02/2018 CLINICAL DATA:  Shortness of breath EXAM: CHEST - 2 VIEW COMPARISON:  August 23, 2018 FINDINGS: Pleural calcifications are seen bilaterally. Pleural thickening and opacity in the right base is similar to previous studies. The cardiomediastinal silhouette is stable. No other acute abnormalities. IMPRESSION: 1. Pleural thickening, pleural calcifications, and bibasilar opacities, right greater than left, are similar since July 17, 2018 and perhaps mildly worsened since August 23, 2018. No other changes. Electronically Signed   By: Dorise Bullion III M.D   On: 09/02/2018 12:54   Ct Chest Wo Contrast  Result Date: 09/02/2018 CLINICAL DATA:  Increasing shortness of breath over the past 2 days, known history of lung carcinoma on the right, productive cough EXAM: CT CHEST WITHOUT CONTRAST TECHNIQUE: Multidetector CT imaging of the chest was performed following the standard protocol without IV contrast. COMPARISON:  PET-CT from 07/10/2018, CT of the chest from 05/18/2018 FINDINGS: Cardiovascular: Limited due to lack of IV contrast. Aortic calcifications are noted without aneurysmal dilatation. No cardiac enlargement is seen. Coronary calcifications are  noted. Mediastinum/Nodes: Thoracic inlet is within normal limits. Stable precarinal lymph node is noted. Persistent soft tissue density in the right hilar region surrounding the bronchus intermedius is noted when compared with the prior exam. This is consistent with hilar adenopathy and local extension of neoplasm. Some fullness in the subcarinal region is noted as well. Lungs/Pleura: Calcified pleural plaques are again noted and stable. There is again seen and extension cavitary lesion within the right lower lobe. The degree of cavitation is relatively stable from the prior PET-CT although the degree of fluid within the cavity has decreased. The more rounded inferior component recently biopsied is again identified with decreased cavitation and more fluid within. Some associated nodularity is noted likely related to local spread. No sizable effusion is seen. Air is noted within the pleural space on the right relatively stable from the recent PET-CT. Stable nodular density is noted in the left upper lobe best seen on image number 53 of series 6. Stable pleural plaquing is noted as well. Upper Abdomen: Visualized upper abdomen is within normal limits. Evaluation is somewhat limited again due to the lack of IV contrast. Musculoskeletal: Degenerative changes of the thoracic spine are noted. No lytic or sclerotic lesion is identified. IMPRESSION: Stable appearing cavitary lesion within the right lower lobe similar to that seen on prior PET-CT consistent with the known history of squamous cell carcinoma. Persistent right hilar density is noted surrounding the bronchus intermedius and lower lobe bronchial tree consistent with progressive neoplastic involvement. Stable subcarinal involvement is noted as well. Changes consistent with prior asbestos exposure. Persistent air is noted within the pleural space on the  right similar to that seen on prior PET-CT. No new focal abnormality is noted. Electronically Signed   By: Inez Catalina M.D.   On: 09/02/2018 22:52   Ct Biopsy  Result Date: 08/23/2018 CLINICAL DATA:  indolent persistent right lower lobe pneumonia not responsive to antibiotics. Previous bronchoscopy has been unrevealing. Recent PET scan shows a more discrete solid mass 3.5 cm posteriorly with hypermetabolic activity on PET scan. Pleural disease has slightly increased but doubt chronic empyema as the source of the patient's illness. Biopsy requested. EXAM: CT GUIDED CORE BIOPSY OF RIGHT LOWER LOBE LUNG LESION ANESTHESIA/SEDATION: Intravenous Fentanyl 57mcg and Versed 1.5mg  were administered as conscious sedation during continuous monitoring of the patient's level of consciousness and physiological / cardiorespiratory status by the radiology RN, with a total moderate sedation time of 11 minutes. PROCEDURE: The procedure risks, benefits, and alternatives were explained to the patient. Questions regarding the procedure were encouraged and answered. The patient understands and consents to the procedure. Patient placed prone. Select axial scans through the thorax obtained. The right lower lobe lesion was localized and an appropriate skin entry site was determined and marked. The operative field was prepped with chlorhexidinein a sterile fashion, and a sterile drape was applied covering the operative field. A sterile gown and sterile gloves were used for the procedure. Local anesthesia was provided with 1% Lidocaine. Under CT fluoroscopic guidance, a 17 gauge trocar needle was advanced to the margin of the lesion. Once needle tip position was confirmed, coaxial 18-gauge core biopsy samples were obtained, submitted in formalin to surgical pathology. The guide needle was removed. Postprocedure scans show no regional alveolar hemorrhage or pneumothorax. The patient tolerated the procedure well. COMPLICATIONS: None immediate FINDINGS: The hypermetabolic posterior right lower lobe lesion was localized. Representative core biopsy  samples obtained as above. IMPRESSION: 1. Technically successful CT-guided core biopsy, right lower lobe mass. Electronically Signed   By: Lucrezia Europe M.D.   On: 08/23/2018 12:36   Dg Chest Port 1 View  Result Date: 08/23/2018 CLINICAL DATA:  Status post right lung biopsy EXAM: PORTABLE CHEST 1 VIEW COMPARISON:  07/17/2018 FINDINGS: Cardiac shadow is stable. Pleural thickening is noted inferiorly on the right stable from the previous exam. The cavitary lesion is less well appreciated on today's exam. No definitive pneumothorax is seen. Calcified pleural plaques are noted. IMPRESSION: No evidence of post biopsy pneumothorax. Electronically Signed   By: Inez Catalina M.D.   On: 08/23/2018 14:10    Micro Results   Recent Results (from the past 240 hour(s))  SARS Coronavirus 2 [facility] order, Performed in [facility] hospital lab)     Status: None   Collection Time: 09/02/18  3:50 PM  Result Value Ref Range Status   SARS Coronavirus 2 NEGATIVE NEGATIVE Final    Comment: (NOTE) If result is NEGATIVE SARS-CoV-2 target nucleic acids are NOT DETECTED. The SARS-CoV-2 RNA is generally detectable in upper and lower  respiratory specimens during the acute phase of infection. The lowest  concentration of SARS-CoV-2 viral copies this assay can detect is 250  copies / mL. A negative result does not preclude SARS-CoV-2 infection  and should not be used as the sole basis for treatment or other  patient management decisions.  A negative result may occur with  improper specimen collection / handling, submission of specimen other  than nasopharyngeal swab, presence of viral mutation(s) within the  areas targeted by this assay, and inadequate number of viral copies  (<250 copies / mL). A  negative result must be combined with clinical  observations, patient history, and epidemiological information. If result is POSITIVE SARS-CoV-2 target nucleic acids are DETECTED. The SARS-CoV-2 RNA is generally detectable in  upper and lower  respiratory specimens dur ing the acute phase of infection.  Positive  results are indicative of active infection with SARS-CoV-2.  Clinical  correlation with patient history and other diagnostic information is  necessary to determine patient infection status.  Positive results do  not rule out bacterial infection or co-infection with other viruses. If result is PRESUMPTIVE POSTIVE SARS-CoV-2 nucleic acids MAY BE PRESENT.   A presumptive positive result was obtained on the submitted specimen  and confirmed on repeat testing.  While 2019 novel coronavirus  (SARS-CoV-2) nucleic acids may be present in the submitted sample  additional confirmatory testing may be necessary for epidemiological  and / or clinical management purposes  to differentiate between  SARS-CoV-2 and other Sarbecovirus currently known to infect humans.  If clinically indicated additional testing with an alternate test  methodology [phone]) is advised. The SARS-CoV-2 RNA is generally  detectable in upper and lower respiratory sp ecimens during the acute  phase of infection. The expected result is Negative. Fact Sheet for Patients:  StrictlyIdeas.no Fact Sheet for Healthcare Providers: BankingDealers.co.za This test is not yet approved or cleared by the [place] FDA and has been authorized for detection and/or diagnosis of SARS-CoV-2 by FDA under an Emergency Use Authorization (EUA).  This EUA will remain in effect (meaning this test can be used) for the duration of the COVID-19 declaration under Section 564(b)(1) of the Act, 21 U.S.C. section 360bbb-3(b)(1), unless the authorization is terminated or revoked sooner. Performed at [facility], [place] [address], [place], [place] [address]        Today   Subjective    Benard Minturn today has no chest pains no shortness of breath at rest, no palpitations no dizziness patient does have  fatigue PT eval appreciated         No bleeding concerns   Patient has been seen and examined prior to discharge   Objective   Blood pressure 127/61, pulse 78, temperature (!) 97.5 F (36.4 C), temperature source Oral, resp. rate 15, height 6\' 3"  (1.905 m), weight 103.8 kg, SpO2 97 %.   Intake/Output Summary (Last 24 hours) at 09/04/2018 1257 Last data filed at 09/04/2018 0930 Gross per 24 hour  Intake 412.5 ml  Output 200 ml  Net 212.5 ml    Exam Gen:- Awake Alert,  In no apparent distress  HEENT:- [place].AT, No sclera icterus Neck-Supple Neck,No JVD,.  Lungs-mostly clear, fair symmetrical air movement CV- S1, S2 normal, now regular (initially afib with RVR) Abd-  +ve B.Sounds, Abd Soft, No tenderness,    Extremity/Skin:- No  edema, pedal pulses present  Psych-affect is appropriate, oriented x3 Neuro-generalized weakness, no new focal deficits, no tremors   Data Review   CBC w Diff:  Lab Results  Component Value Date   WBC 10.7 (H) 09/04/2018   HGB 8.7 (L) 09/04/2018   HGB 9.3 (L) 08/01/2017   HGB 9.6 (L) 02/01/2017   HCT 28.3 (L) 09/04/2018   HCT 30.6 (L) 02/01/2017   PLT 243 09/04/2018   PLT 205 08/01/2017   PLT 183 02/01/2017   LYMPHOPCT 14 09/02/2018   LYMPHOPCT 20.0 02/01/2017   MONOPCT 7 09/02/2018   MONOPCT 5.7 02/01/2017   EOSPCT 3 09/02/2018   EOSPCT 5.9 02/01/2017   BASOPCT 1 09/02/2018  BASOPCT 0.3 02/01/2017    CMP:  Lab Results  Component Value Date   NA 133 (L) 09/02/2018   NA 139 02/01/2017   K 4.5 09/02/2018   K 4.2 02/01/2017   CL 99 09/02/2018   CO2 21 (L) 09/02/2018   CO2 22 02/01/2017   BUN 13 09/02/2018   BUN 36.1 (H) 02/01/2017   CREATININE 1.23 09/02/2018   CREATININE 1.26 (H) 02/23/2018   CREATININE 1.5 (H) 02/01/2017   PROT 7.1 09/02/2018   PROT 7.1 02/01/2017   ALBUMIN 2.9 (L) 09/02/2018   ALBUMIN 3.4 (L) 02/01/2017   BILITOT 0.7 09/02/2018   BILITOT 0.4 02/23/2018   BILITOT 0.27 02/01/2017   ALKPHOS 62 09/02/2018    ALKPHOS 89 02/01/2017   AST 18 09/02/2018   AST 17 02/23/2018   AST 20 02/01/2017   ALT 13 09/02/2018   ALT 15 02/23/2018   ALT 16 02/01/2017  .   Total Discharge time is about 33 minutes  Roxan Hockey M.D on 09/04/2018 at 12:57 PM  Go to [email or web address] -  for contact info  [facility] - Office  [phone]

## 2018-09-04 NOTE — Consult Note (Signed)
   [facility] CM Inpatient Consult   09/04/2018  Roger Blackwell Oct 01, 1935 [id number]   Patient screened for low risk score for unplanned readmission however has had 2 hospitalizations in the past 6 months. Patient was hospitalized for atrial Fib.  Patient has a history with General EMMI follow up and assigned to calls again.  Primary Care Provider is [name] and this office is listed to provide the transition of care follow up.  For questions contact:    Natividad Brood, RN BSN [facility] Liaison  [phone] business mobile phone Toll free office [phone]

## 2018-09-04 NOTE — Discharge Instructions (Addendum)
1)Squamous cell carcinoma of the right lung with cavitary lesion----follow-up with radiation oncology as outpatient,   2)Take Eliquis/apixaban which is a blood thinner to prevent strokes 3)Since you are taking Apixaban/Eliquis which is a blood thinner please... Avoid ibuprofen/Advil/Aleve/Motrin/Goody Powders/Naproxen/BC powders/Meloxicam/Diclofenac/Indomethacin and other Nonsteroidal anti-inflammatory medications as these will make you more likely to bleed and can cause stomach ulcers, can also cause Kidney problems. 4) your blood pressure medications and insulin dosage has been adjusted 5)Primary care physician for repeat CBC and BMP blood test within a week   Information on my medicine - ELIQUIS (apixaban)  Why was Eliquis prescribed for you? Eliquis was prescribed for you to reduce the risk of a blood clot forming that can cause a stroke if you have a medical condition called atrial fibrillation (a type of irregular heartbeat).  What do You need to know about Eliquis ? Take your Eliquis TWICE DAILY - one tablet in the morning and one tablet in the evening with or without food. If you have difficulty swallowing the tablet whole please discuss with your pharmacist how to take the medication safely.  Take Eliquis exactly as prescribed by your doctor and DO NOT stop taking Eliquis without talking to the doctor who prescribed the medication.  Stopping may increase your risk of developing a stroke.  Refill your prescription before you run out.  After discharge, you should have regular check-up appointments with your healthcare provider that is prescribing your Eliquis.  In the future your dose may need to be changed if your kidney function or weight changes by a significant amount or as you get older.  What do you do if you miss a dose? If you miss a dose, take it as soon as you remember on the same day and resume taking twice daily.  Do not take more than one dose of ELIQUIS at the same  time to make up a missed dose.  Important Safety Information A possible side effect of Eliquis is bleeding. You should call your healthcare provider right away if you experience any of the following: ? Bleeding from an injury or your nose that does not stop. ? Unusual colored urine (red or dark brown) or unusual colored stools (red or black). ? Unusual bruising for unknown reasons. ? A serious fall or if you hit your head (even if there is no bleeding).  Some medicines may interact with Eliquis and might increase your risk of bleeding or clotting while on Eliquis. To help avoid this, consult your healthcare provider or pharmacist prior to using any new prescription or non-prescription medications, including herbals, vitamins, non-steroidal anti-inflammatory drugs (NSAIDs) and supplements.  This website has more information on Eliquis (apixaban): http://www.eliquis.com/eliquis/home   1)Squamous cell carcinoma of the right lung with cavitary lesion----follow-up with radiation oncology as outpatient,   2)Take Eliquis/apixaban which is a blood thinner to prevent strokes 3)Since you are taking Apixaban/Eliquis which is a blood thinner please... Avoid ibuprofen/Advil/Aleve/Motrin/Goody Powders/Naproxen/BC powders/Meloxicam/Diclofenac/Indomethacin and other Nonsteroidal anti-inflammatory medications as these will make you more likely to bleed and can cause stomach ulcers, can also cause Kidney problems. 4) your blood pressure medications and insulin dosage has been adjusted 5)Primary care physician for repeat CBC and BMP blood test within a week

## 2018-09-06 ENCOUNTER — Other Ambulatory Visit (HOSPITAL_COMMUNITY)
Admission: RE | Admit: 2018-09-06 | Discharge: 2018-09-06 | Disposition: A | Discharge status: Home / Self Care | Payer: Medicare Other | Source: Ambulatory Visit | Attending: Hematology | Admitting: Hematology

## 2018-09-06 DIAGNOSIS — I2510 Atherosclerotic heart disease of native coronary artery without angina pectoris: Secondary | ICD-10-CM | POA: Diagnosis not present

## 2018-09-06 DIAGNOSIS — M109 Gout, unspecified: Secondary | ICD-10-CM | POA: Diagnosis not present

## 2018-09-06 DIAGNOSIS — I130 Hypertensive heart and chronic kidney disease with heart failure and stage 1 through stage 4 chronic kidney disease, or unspecified chronic kidney disease: Secondary | ICD-10-CM | POA: Diagnosis not present

## 2018-09-06 DIAGNOSIS — I480 Paroxysmal atrial fibrillation: Secondary | ICD-10-CM | POA: Diagnosis not present

## 2018-09-06 DIAGNOSIS — J850 Gangrene and necrosis of lung: Secondary | ICD-10-CM | POA: Diagnosis not present

## 2018-09-06 DIAGNOSIS — C3491 Malignant neoplasm of unspecified part of right bronchus or lung: Secondary | ICD-10-CM | POA: Insufficient documentation

## 2018-09-06 DIAGNOSIS — E1122 Type 2 diabetes mellitus with diabetic chronic kidney disease: Secondary | ICD-10-CM | POA: Diagnosis not present

## 2018-09-06 DIAGNOSIS — I5030 Unspecified diastolic (congestive) heart failure: Secondary | ICD-10-CM | POA: Diagnosis not present

## 2018-09-06 DIAGNOSIS — D509 Iron deficiency anemia, unspecified: Secondary | ICD-10-CM | POA: Diagnosis not present

## 2018-09-06 DIAGNOSIS — N183 Chronic kidney disease, stage 3 (moderate): Secondary | ICD-10-CM | POA: Diagnosis not present

## 2018-09-06 DIAGNOSIS — E440 Moderate protein-calorie malnutrition: Secondary | ICD-10-CM | POA: Diagnosis not present

## 2018-09-06 DIAGNOSIS — H409 Unspecified glaucoma: Secondary | ICD-10-CM | POA: Diagnosis not present

## 2018-09-07 ENCOUNTER — Ambulatory Visit: Payer: Medicare Other

## 2018-09-07 ENCOUNTER — Ambulatory Visit: Payer: Medicare Other | Admitting: Radiation Oncology

## 2018-09-08 DIAGNOSIS — J850 Gangrene and necrosis of lung: Secondary | ICD-10-CM | POA: Diagnosis not present

## 2018-09-08 DIAGNOSIS — D509 Iron deficiency anemia, unspecified: Secondary | ICD-10-CM | POA: Diagnosis not present

## 2018-09-08 DIAGNOSIS — H409 Unspecified glaucoma: Secondary | ICD-10-CM | POA: Diagnosis not present

## 2018-09-08 DIAGNOSIS — C3491 Malignant neoplasm of unspecified part of right bronchus or lung: Secondary | ICD-10-CM | POA: Diagnosis not present

## 2018-09-08 DIAGNOSIS — N183 Chronic kidney disease, stage 3 (moderate): Secondary | ICD-10-CM | POA: Diagnosis not present

## 2018-09-08 DIAGNOSIS — I5030 Unspecified diastolic (congestive) heart failure: Secondary | ICD-10-CM | POA: Diagnosis not present

## 2018-09-08 DIAGNOSIS — I130 Hypertensive heart and chronic kidney disease with heart failure and stage 1 through stage 4 chronic kidney disease, or unspecified chronic kidney disease: Secondary | ICD-10-CM | POA: Diagnosis not present

## 2018-09-08 DIAGNOSIS — E1122 Type 2 diabetes mellitus with diabetic chronic kidney disease: Secondary | ICD-10-CM | POA: Diagnosis not present

## 2018-09-08 DIAGNOSIS — E440 Moderate protein-calorie malnutrition: Secondary | ICD-10-CM | POA: Diagnosis not present

## 2018-09-08 DIAGNOSIS — I2510 Atherosclerotic heart disease of native coronary artery without angina pectoris: Secondary | ICD-10-CM | POA: Diagnosis not present

## 2018-09-08 DIAGNOSIS — I480 Paroxysmal atrial fibrillation: Secondary | ICD-10-CM | POA: Diagnosis not present

## 2018-09-08 DIAGNOSIS — M109 Gout, unspecified: Secondary | ICD-10-CM | POA: Diagnosis not present

## 2018-09-12 ENCOUNTER — Telehealth: Payer: Self-pay | Admitting: Internal Medicine

## 2018-09-12 DIAGNOSIS — I130 Hypertensive heart and chronic kidney disease with heart failure and stage 1 through stage 4 chronic kidney disease, or unspecified chronic kidney disease: Secondary | ICD-10-CM | POA: Diagnosis not present

## 2018-09-12 DIAGNOSIS — I480 Paroxysmal atrial fibrillation: Secondary | ICD-10-CM | POA: Diagnosis not present

## 2018-09-12 DIAGNOSIS — H409 Unspecified glaucoma: Secondary | ICD-10-CM | POA: Diagnosis not present

## 2018-09-12 DIAGNOSIS — C3491 Malignant neoplasm of unspecified part of right bronchus or lung: Secondary | ICD-10-CM | POA: Diagnosis not present

## 2018-09-12 DIAGNOSIS — D509 Iron deficiency anemia, unspecified: Secondary | ICD-10-CM | POA: Diagnosis not present

## 2018-09-12 DIAGNOSIS — E440 Moderate protein-calorie malnutrition: Secondary | ICD-10-CM | POA: Diagnosis not present

## 2018-09-12 DIAGNOSIS — Z09 Encounter for follow-up examination after completed treatment for conditions other than malignant neoplasm: Secondary | ICD-10-CM | POA: Diagnosis not present

## 2018-09-12 DIAGNOSIS — I5030 Unspecified diastolic (congestive) heart failure: Secondary | ICD-10-CM | POA: Diagnosis not present

## 2018-09-12 DIAGNOSIS — N183 Chronic kidney disease, stage 3 (moderate): Secondary | ICD-10-CM | POA: Diagnosis not present

## 2018-09-12 DIAGNOSIS — I5032 Chronic diastolic (congestive) heart failure: Secondary | ICD-10-CM | POA: Diagnosis not present

## 2018-09-12 DIAGNOSIS — M109 Gout, unspecified: Secondary | ICD-10-CM | POA: Diagnosis not present

## 2018-09-12 DIAGNOSIS — I2510 Atherosclerotic heart disease of native coronary artery without angina pectoris: Secondary | ICD-10-CM | POA: Diagnosis not present

## 2018-09-12 DIAGNOSIS — I10 Essential (primary) hypertension: Secondary | ICD-10-CM | POA: Diagnosis not present

## 2018-09-12 DIAGNOSIS — E1122 Type 2 diabetes mellitus with diabetic chronic kidney disease: Secondary | ICD-10-CM | POA: Diagnosis not present

## 2018-09-12 DIAGNOSIS — I4891 Unspecified atrial fibrillation: Secondary | ICD-10-CM | POA: Diagnosis not present

## 2018-09-12 DIAGNOSIS — J850 Gangrene and necrosis of lung: Secondary | ICD-10-CM | POA: Diagnosis not present

## 2018-09-13 DIAGNOSIS — J449 Chronic obstructive pulmonary disease, unspecified: Secondary | ICD-10-CM | POA: Diagnosis not present

## 2018-09-13 NOTE — Telephone Encounter (Signed)
Message in error °

## 2018-09-14 ENCOUNTER — Encounter: Payer: Self-pay | Admitting: Internal Medicine

## 2018-09-14 ENCOUNTER — Other Ambulatory Visit: Payer: Self-pay

## 2018-09-14 ENCOUNTER — Ambulatory Visit (INDEPENDENT_AMBULATORY_CARE_PROVIDER_SITE_OTHER): Payer: Medicare Other | Admitting: Internal Medicine

## 2018-09-14 VITALS — BP 86/50 | HR 107

## 2018-09-14 DIAGNOSIS — J850 Gangrene and necrosis of lung: Secondary | ICD-10-CM

## 2018-09-14 DIAGNOSIS — I10 Essential (primary) hypertension: Secondary | ICD-10-CM

## 2018-09-14 DIAGNOSIS — C3491 Malignant neoplasm of unspecified part of right bronchus or lung: Secondary | ICD-10-CM | POA: Diagnosis not present

## 2018-09-14 DIAGNOSIS — D509 Iron deficiency anemia, unspecified: Secondary | ICD-10-CM | POA: Diagnosis not present

## 2018-09-14 DIAGNOSIS — N183 Chronic kidney disease, stage 3 (moderate): Secondary | ICD-10-CM | POA: Diagnosis not present

## 2018-09-14 DIAGNOSIS — R0609 Other forms of dyspnea: Secondary | ICD-10-CM

## 2018-09-14 DIAGNOSIS — M109 Gout, unspecified: Secondary | ICD-10-CM | POA: Diagnosis not present

## 2018-09-14 DIAGNOSIS — I130 Hypertensive heart and chronic kidney disease with heart failure and stage 1 through stage 4 chronic kidney disease, or unspecified chronic kidney disease: Secondary | ICD-10-CM | POA: Diagnosis not present

## 2018-09-14 DIAGNOSIS — H409 Unspecified glaucoma: Secondary | ICD-10-CM | POA: Diagnosis not present

## 2018-09-14 DIAGNOSIS — J449 Chronic obstructive pulmonary disease, unspecified: Secondary | ICD-10-CM | POA: Diagnosis not present

## 2018-09-14 DIAGNOSIS — I2510 Atherosclerotic heart disease of native coronary artery without angina pectoris: Secondary | ICD-10-CM | POA: Diagnosis not present

## 2018-09-14 DIAGNOSIS — E440 Moderate protein-calorie malnutrition: Secondary | ICD-10-CM | POA: Diagnosis not present

## 2018-09-14 DIAGNOSIS — I480 Paroxysmal atrial fibrillation: Secondary | ICD-10-CM | POA: Diagnosis not present

## 2018-09-14 DIAGNOSIS — I5030 Unspecified diastolic (congestive) heart failure: Secondary | ICD-10-CM | POA: Diagnosis not present

## 2018-09-14 DIAGNOSIS — E1122 Type 2 diabetes mellitus with diabetic chronic kidney disease: Secondary | ICD-10-CM | POA: Diagnosis not present

## 2018-09-14 MED ORDER — IPRATROPIUM-ALBUTEROL 0.5-2.5 (3) MG/3ML IN SOLN: 3.0000 mL | Freq: Four times a day (QID) | RESPIRATORY_TRACT | 11 refills | Status: AC | PRN

## 2018-09-14 NOTE — Patient Instructions (Addendum)
Stop losartan and maybe hold lasix(furosemide)  if no excess swelling in legs   Sleep in recliner for now  Change over to nebulizer up every 6 hours if needed   Pulmonary follow up is as needed

## 2018-09-14 NOTE — Progress Notes (Signed)
Roger Blackwell, male    DOB: January 23, 1936,     MRN: [id number]   Brief patient profile:  82 yobm quit smoking 1994 at time of L chest surgery by Roger Blackwell for empyema (records suggest 2000 for date but pt thinks it was 1994)  with new feeling bad Oct 2019 loss of appetite cough sometimes bloody x end of Dec 2019 > admit 1/3-01/2019   PCCM eval:   former smoker, admitted with concern for necrotizing PNA.  Additionally, found to have left pleural based mass, pleural thickening and pleural plaques.  He has had 40-50lb weight loss, decreased appetite, occasional night sweats.  He has had pleural thickening dating back to 2014.  Concern for possible PNA but constellation of symptoms also worrisome for malignancy.  Must also consider aspiration.      Necrotizing PNA with Hemoptysis -R dense consolidation with pleural effusion with gas P: Bronch results reviewed with normal respiratory flora, malignancy Agree with 4 weeks of Augmentin Follow-up with Roger Blackwell on 2/3 with repeat imaging to make sure the RLL opacity is clearing up. If opacity is persistent then can consider PET scan.  LUL Pleural Based Mass  P: Follow up as outpatient  Pleural Plaques -no hx of known asbestos exposure  P: Follow up CT imaging  Hx of Empyema -around 2000, s/p L  thoracotomy by Roger. Arlyce Blackwell     History of Present Illness  06/19/2018  Pulmonary/ 1st office eval/Roger Blackwell  Has completed 3 out of 4 weeks of augmentin planned Chief Complaint  Patient presents with  . Consult  Dyspnea:  7/-11 not HT  Cough: occ small chunks of old dark blood, cough is worse p stir in am / at hs and noct bothers wife but not pt   Sleep: flat on back / 2 pillows  SABA use: none  Pt is edentulous, no risk factors identified for aspiration. rec Roger Blackwell taking all young antibiotics  Stop aspirin if the bleeding worsens - ok to restart if not bleeding x 3 straight days  Protonix 40 mg Take 30- 60 min before your first and last meals of  the day and supplement with tyl #3 one every 4 hours as needed.  For mucinex dm 1200 mg every 12 hours as needed  Please remember to go to the  x-ray department  for your tests - we will call you with the results when they are available   Please schedule a follow up office visit in 4 weeks, sooner if needed  - add:  Discussed with Roger Roger Blackwell in radiology > PET next step > sent req to schedulers   - PET 07/11/18 1. Relatively similar appearance of the right lower lobe compared to 05/18/2018. Linear hypermetabolism corresponding to consolidation with immediately subjacent lung necrosis and developing pulmonary abscess. Presumably secondary small right hydropneumothorax suggestive of bronchopleural fistula. This process is favored to be infectious. Consider atypical pneumonia or chronic aspiration. 2. Persistent right lower lobe hypermetabolic mass with central necrosis or developing cavitation. This is indeterminate and could be infectious/inflammatory or neoplastic>   Referred to T surgery > Roger Blackwell rec IR bx done 08/23/2018 dx sq cell ca > not operable ? Stage III > referred to oncology     09/14/2018  f/u ov/Roger Blackwell re: lung ca Chief Complaint  Patient presents with  . Follow-up    weakness, SOB, cough with clear phlegm  Dyspnea:  Ok at rest but room to room  Cough: clear phlegm  Sleeping: trying to sleep in  bed worse, better in recliner  SABA use: qid but poor, has neb on the way 02: no   No obvious day to day or daytime variability or assoc  purulent sputum or mucus plugs or hemoptysis or cp or chest tightness, subjective wheeze or overt sinus or hb symptoms.    Also denies any obvious fluctuation of symptoms with weather or environmental changes or other aggravating or alleviating factors except as outlined above   No unusual exposure hx or h/o childhood pna/ asthma or knowledge of premature birth.  Current Allergies, Complete Past Medical History, Past Surgical History, Family  History, and Social History were reviewed in [employer] record.  ROS  The following are not active complaints unless bolded Hoarseness, sore throat, dysphagia, dental problems, itching, sneezing,  nasal congestion or discharge of excess mucus or purulent secretions, ear ache,   fever, chills, sweats, unintended wt loss or wt gain, classically pleuritic or exertional cp,  orthopnea pnd or arm/hand swelling  or leg swelling, presyncope, palpitations, abdominal pain, anorexia, nausea, vomiting, diarrhea  or change in bowel habits or change in bladder habits, change in stools or change in urine, dysuria, hematuria,  rash, arthralgias, visual complaints, headache, numbness, weakness or ataxia or problems with walking or coordination,  change in mood or  memory.        Current Meds  Medication Sig  . albuterol (PROVENTIL HFA;VENTOLIN HFA) 108 (90 Base) MCG/ACT inhaler Inhale 2 puffs into the lungs every 6 (six) hours as needed for wheezing or shortness of breath.  . allopurinol (ZYLOPRIM) 100 MG tablet Take 300 mg by mouth daily.   Marland Kitchen apixaban (ELIQUIS) 5 MG TABS tablet Take 1 tablet (5 mg total) by mouth 2 (two) times daily.  Marland Kitchen diltiazem (CARDIZEM CD) 120 MG 24 hr capsule Take 1 capsule (120 mg total) by mouth daily.  . furosemide (LASIX) 40 MG tablet Take 1 tablet (40 mg total) by mouth daily as needed for fluid or edema.  . insulin lispro (HUMALOG KWIKPEN) 100 UNIT/ML KwikPen Inject 0.03 mLs (3 Units total) into the skin See admin instructions. Inject 3 units into the skin with meals as needed for a BGL reading of 150 or greater  . iron polysaccharides (NIFEREX) 150 MG capsule Take 150 mg by mouth every evening.   Marland Kitchen LEVEMIR FLEXTOUCH 100 UNIT/ML Pen Inject 18 Units into the skin See admin instructions. Inject 18 units into the skin at bedtime if BGL is 170-180 or greater  . losartan (COZAAR) 50 MG tablet Take 1 tablet (50 mg total) by mouth daily.  . mirtazapine (REMERON) 15 MG  tablet Take 1 tablet (15 mg total) by mouth at bedtime.  . Potassium 99 MG TABS Take 99 mg by mouth every other day.   . Travoprost, BAK Free, (TRAVATAN) 0.004 % SOLN ophthalmic solution Place 1 drop into both eyes at bedtime.                  Objective:     w/c bound elderly bm nad   09/14/2018       Not able to stand for wt  07/17/2018          248   06/19/18 258 lb 12.8 oz (117.4 kg)  06/15/18 258 lb (117 kg)  05/19/18 233 lb (105.7 kg)    Vital signs reviewed - Note on arrival 02 sats  96% on RA   And note bp 86/50    HEENT: Edentulous  turbinates bilaterally, and  oropharynx. Nl external ear canals without cough reflex   NECK :  without JVD/Nodes/TM/ nl carotid upstrokes bilaterally   LUNGS: no acc muscle use,  Nl contour chest with diminished bs esp in R base with dullness/ no amphoric changes / minimal rhonchi   CV:  RRR  no s3 or murmur or increase in P2, and  Trace sym bilateral LE edema   ABD:  soft and nontender with nl inspiratory excursion in the supine position. No bruits or organomegaly appreciated, bowel sounds nl  MS:    ext warm without deformities, calf tenderness, cyanosis or clubbing No obvious joint restrictions   SKIN: warm and dry without lesions    NEURO:  alert, approp, nl sensorium with  no motor or cerebellar deficits apparent.          I personally reviewed images and agree with radiology impression as follows:   Chest CT w/o 09/02/2018  Stable appearing cavitary lesion within the right lower lobe similar to that seen on prior PET-CT consistent with the known history of squamous cell carcinoma. Persistent right hilar density is noted surrounding the bronchus intermedius and lower lobe bronchial tree consistent with progressive neoplastic involvement. Stable subcarinal involvement is noted as well. Changes consistent with prior asbestos exposure. Persistent air is noted within the pleural space on the right similar to that seen on prior  PET-CT. No new focal abnormality is noted.              Assessment

## 2018-09-15 ENCOUNTER — Encounter: Payer: Self-pay | Admitting: Internal Medicine

## 2018-09-15 NOTE — Assessment & Plan Note (Signed)
?   Onset of symptoms oct 2019  - FOB  05/23/2018  Nl flora, no malignancy :  afb smears neg/ fungus studies ordered but not done  - PET 07/11/18 1. Relatively similar appearance of the right lower lobe compared to 05/18/2018. Linear hypermetabolism corresponding to consolidation with immediately subjacent lung necrosis and developing pulmonary abscess. Presumably secondary small right hydropneumothorax suggestive of bronchopleural fistula. This process is favored to be infectious. Consider atypical pneumonia or chronic aspiration. 2. Persistent right lower lobe hypermetabolic mass with central necrosis or developing cavitation. This is indeterminate and could be infectious/inflammatory or neoplastic>   Referred to T surgery > Dr Lawson Fiscal rec IR bx done 08/23/2018 dx sq cell ca > not operable ? Stage III > referred to oncology  Nothing else to offer thru this clinic once established with oncology - f/u can be prn

## 2018-09-15 NOTE — Assessment & Plan Note (Signed)
Over treated at this point and at risk of AKI when not taking po so rec d/c ARB and just use lasix prn excess leg swelling    I had an extended discussion with the patient/wife reviewing all relevant studies completed to date and  lasting 15 to 20 minutes of a 25 minute visit    See device teaching which extended face to face time for this visit.  Each maintenance medication was reviewed in detail including emphasizing most importantly the difference between maintenance and prns and under what circumstances the prns are to be triggered using an action plan format that is not reflected in the computer generated alphabetically organized AVS which I have not found useful in most complex patients, especially with respiratory illnesses  Please see AVS for specific instructions unique to this visit that I personally wrote and verbalized to the the pt in detail and then reviewed with pt  by my nurse highlighting any  changes in therapy recommended at today's visit to their plan of care.

## 2018-09-15 NOTE — Assessment & Plan Note (Signed)
Onset Oct 2019  - Spirometry 07/17/2018  FEV1 1.5 (47%)  Ratio 0.89 s curvature off all rx  - 07/17/2018   Walked RA  2 laps @  approx 250ft each @ avg pace  stopped due to end of study, no sob with sats 99% at end  - 09/14/2018  After extensive coaching inhaler device,  effectiveness =    0% so change to duoneb prn but this really isn't copd related sob

## 2018-09-19 DIAGNOSIS — M109 Gout, unspecified: Secondary | ICD-10-CM | POA: Diagnosis not present

## 2018-09-19 DIAGNOSIS — H409 Unspecified glaucoma: Secondary | ICD-10-CM | POA: Diagnosis not present

## 2018-09-19 DIAGNOSIS — I130 Hypertensive heart and chronic kidney disease with heart failure and stage 1 through stage 4 chronic kidney disease, or unspecified chronic kidney disease: Secondary | ICD-10-CM | POA: Diagnosis not present

## 2018-09-19 DIAGNOSIS — I5030 Unspecified diastolic (congestive) heart failure: Secondary | ICD-10-CM | POA: Diagnosis not present

## 2018-09-19 DIAGNOSIS — J850 Gangrene and necrosis of lung: Secondary | ICD-10-CM | POA: Diagnosis not present

## 2018-09-19 DIAGNOSIS — N183 Chronic kidney disease, stage 3 (moderate): Secondary | ICD-10-CM | POA: Diagnosis not present

## 2018-09-19 DIAGNOSIS — I480 Paroxysmal atrial fibrillation: Secondary | ICD-10-CM | POA: Diagnosis not present

## 2018-09-19 DIAGNOSIS — E1122 Type 2 diabetes mellitus with diabetic chronic kidney disease: Secondary | ICD-10-CM | POA: Diagnosis not present

## 2018-09-19 DIAGNOSIS — I2510 Atherosclerotic heart disease of native coronary artery without angina pectoris: Secondary | ICD-10-CM | POA: Diagnosis not present

## 2018-09-19 DIAGNOSIS — C3491 Malignant neoplasm of unspecified part of right bronchus or lung: Secondary | ICD-10-CM | POA: Diagnosis not present

## 2018-09-19 DIAGNOSIS — D509 Iron deficiency anemia, unspecified: Secondary | ICD-10-CM | POA: Diagnosis not present

## 2018-09-19 DIAGNOSIS — E440 Moderate protein-calorie malnutrition: Secondary | ICD-10-CM | POA: Diagnosis not present

## 2018-09-20 ENCOUNTER — Ambulatory Visit
Admission: RE | Admit: 2018-09-20 | Discharge: 2018-09-20 | Disposition: A | Discharge status: Home / Self Care | Payer: Medicare Other | Source: Ambulatory Visit | Attending: Radiation Oncology | Admitting: Radiation Oncology

## 2018-09-20 ENCOUNTER — Ambulatory Visit: Payer: Medicare Other

## 2018-09-21 DIAGNOSIS — C3491 Malignant neoplasm of unspecified part of right bronchus or lung: Secondary | ICD-10-CM | POA: Diagnosis not present

## 2018-09-21 DIAGNOSIS — I5030 Unspecified diastolic (congestive) heart failure: Secondary | ICD-10-CM | POA: Diagnosis not present

## 2018-09-21 DIAGNOSIS — E440 Moderate protein-calorie malnutrition: Secondary | ICD-10-CM | POA: Diagnosis not present

## 2018-09-21 DIAGNOSIS — N183 Chronic kidney disease, stage 3 (moderate): Secondary | ICD-10-CM | POA: Diagnosis not present

## 2018-09-21 DIAGNOSIS — D509 Iron deficiency anemia, unspecified: Secondary | ICD-10-CM | POA: Diagnosis not present

## 2018-09-21 DIAGNOSIS — H409 Unspecified glaucoma: Secondary | ICD-10-CM | POA: Diagnosis not present

## 2018-09-21 DIAGNOSIS — I480 Paroxysmal atrial fibrillation: Secondary | ICD-10-CM | POA: Diagnosis not present

## 2018-09-21 DIAGNOSIS — I2510 Atherosclerotic heart disease of native coronary artery without angina pectoris: Secondary | ICD-10-CM | POA: Diagnosis not present

## 2018-09-21 DIAGNOSIS — I130 Hypertensive heart and chronic kidney disease with heart failure and stage 1 through stage 4 chronic kidney disease, or unspecified chronic kidney disease: Secondary | ICD-10-CM | POA: Diagnosis not present

## 2018-09-21 DIAGNOSIS — E1122 Type 2 diabetes mellitus with diabetic chronic kidney disease: Secondary | ICD-10-CM | POA: Diagnosis not present

## 2018-09-21 DIAGNOSIS — J850 Gangrene and necrosis of lung: Secondary | ICD-10-CM | POA: Diagnosis not present

## 2018-09-21 DIAGNOSIS — M109 Gout, unspecified: Secondary | ICD-10-CM | POA: Diagnosis not present

## 2018-09-28 DIAGNOSIS — C3491 Malignant neoplasm of unspecified part of right bronchus or lung: Secondary | ICD-10-CM | POA: Diagnosis not present

## 2018-09-28 DIAGNOSIS — J850 Gangrene and necrosis of lung: Secondary | ICD-10-CM | POA: Diagnosis not present

## 2018-09-28 DIAGNOSIS — H409 Unspecified glaucoma: Secondary | ICD-10-CM | POA: Diagnosis not present

## 2018-09-28 DIAGNOSIS — E1122 Type 2 diabetes mellitus with diabetic chronic kidney disease: Secondary | ICD-10-CM | POA: Diagnosis not present

## 2018-09-28 DIAGNOSIS — E440 Moderate protein-calorie malnutrition: Secondary | ICD-10-CM | POA: Diagnosis not present

## 2018-09-28 DIAGNOSIS — I5030 Unspecified diastolic (congestive) heart failure: Secondary | ICD-10-CM | POA: Diagnosis not present

## 2018-09-28 DIAGNOSIS — I130 Hypertensive heart and chronic kidney disease with heart failure and stage 1 through stage 4 chronic kidney disease, or unspecified chronic kidney disease: Secondary | ICD-10-CM | POA: Diagnosis not present

## 2018-09-28 DIAGNOSIS — I2510 Atherosclerotic heart disease of native coronary artery without angina pectoris: Secondary | ICD-10-CM | POA: Diagnosis not present

## 2018-09-28 DIAGNOSIS — D509 Iron deficiency anemia, unspecified: Secondary | ICD-10-CM | POA: Diagnosis not present

## 2018-09-28 DIAGNOSIS — M109 Gout, unspecified: Secondary | ICD-10-CM | POA: Diagnosis not present

## 2018-09-28 DIAGNOSIS — I480 Paroxysmal atrial fibrillation: Secondary | ICD-10-CM | POA: Diagnosis not present

## 2018-09-28 DIAGNOSIS — N183 Chronic kidney disease, stage 3 (moderate): Secondary | ICD-10-CM | POA: Diagnosis not present

## 2018-10-02 ENCOUNTER — Other Ambulatory Visit: Payer: Self-pay

## 2018-10-02 ENCOUNTER — Emergency Department (HOSPITAL_COMMUNITY): Payer: Medicare Other

## 2018-10-02 ENCOUNTER — Inpatient Hospital Stay (HOSPITAL_COMMUNITY)
Admission: EM | Admit: 2018-10-02 | Discharge: 2018-10-04 | DRG: 308 | Disposition: A | Discharge status: Home Health | Payer: Medicare Other | Attending: Internal Medicine | Admitting: Internal Medicine

## 2018-10-02 ENCOUNTER — Encounter (HOSPITAL_COMMUNITY): Payer: Self-pay

## 2018-10-02 ENCOUNTER — Ambulatory Visit
Admission: RE | Admit: 2018-10-02 | Discharge: 2018-10-02 | Disposition: A | Discharge status: Home / Self Care | Payer: Medicare Other | Source: Ambulatory Visit | Attending: Radiation Oncology | Admitting: Radiation Oncology

## 2018-10-02 ENCOUNTER — Encounter: Payer: Self-pay | Admitting: Radiation Oncology

## 2018-10-02 DIAGNOSIS — R634 Abnormal weight loss: Secondary | ICD-10-CM | POA: Diagnosis present

## 2018-10-02 DIAGNOSIS — N183 Chronic kidney disease, stage 3 unspecified: Secondary | ICD-10-CM | POA: Diagnosis present

## 2018-10-02 DIAGNOSIS — J949 Pleural condition, unspecified: Secondary | ICD-10-CM | POA: Diagnosis not present

## 2018-10-02 DIAGNOSIS — Z87891 Personal history of nicotine dependence: Secondary | ICD-10-CM | POA: Insufficient documentation

## 2018-10-02 DIAGNOSIS — Z91041 Radiographic dye allergy status: Secondary | ICD-10-CM

## 2018-10-02 DIAGNOSIS — Z7901 Long term (current) use of anticoagulants: Secondary | ICD-10-CM | POA: Insufficient documentation

## 2018-10-02 DIAGNOSIS — Z794 Long term (current) use of insulin: Secondary | ICD-10-CM

## 2018-10-02 DIAGNOSIS — Z79899 Other long term (current) drug therapy: Secondary | ICD-10-CM | POA: Insufficient documentation

## 2018-10-02 DIAGNOSIS — Z8249 Family history of ischemic heart disease and other diseases of the circulatory system: Secondary | ICD-10-CM | POA: Diagnosis not present

## 2018-10-02 DIAGNOSIS — I2510 Atherosclerotic heart disease of native coronary artery without angina pectoris: Secondary | ICD-10-CM | POA: Diagnosis present

## 2018-10-02 DIAGNOSIS — Z1159 Encounter for screening for other viral diseases: Secondary | ICD-10-CM | POA: Diagnosis not present

## 2018-10-02 DIAGNOSIS — I4891 Unspecified atrial fibrillation: Secondary | ICD-10-CM | POA: Insufficient documentation

## 2018-10-02 DIAGNOSIS — H409 Unspecified glaucoma: Secondary | ICD-10-CM | POA: Diagnosis present

## 2018-10-02 DIAGNOSIS — E1122 Type 2 diabetes mellitus with diabetic chronic kidney disease: Secondary | ICD-10-CM

## 2018-10-02 DIAGNOSIS — J850 Gangrene and necrosis of lung: Secondary | ICD-10-CM | POA: Diagnosis not present

## 2018-10-02 DIAGNOSIS — M109 Gout, unspecified: Secondary | ICD-10-CM | POA: Diagnosis present

## 2018-10-02 DIAGNOSIS — I130 Hypertensive heart and chronic kidney disease with heart failure and stage 1 through stage 4 chronic kidney disease, or unspecified chronic kidney disease: Secondary | ICD-10-CM | POA: Diagnosis present

## 2018-10-02 DIAGNOSIS — Z923 Personal history of irradiation: Secondary | ICD-10-CM

## 2018-10-02 DIAGNOSIS — E860 Dehydration: Secondary | ICD-10-CM | POA: Diagnosis not present

## 2018-10-02 DIAGNOSIS — I10 Essential (primary) hypertension: Secondary | ICD-10-CM | POA: Insufficient documentation

## 2018-10-02 DIAGNOSIS — Z77090 Contact with and (suspected) exposure to asbestos: Secondary | ICD-10-CM | POA: Diagnosis present

## 2018-10-02 DIAGNOSIS — I5032 Chronic diastolic (congestive) heart failure: Secondary | ICD-10-CM | POA: Diagnosis not present

## 2018-10-02 DIAGNOSIS — Z7401 Bed confinement status: Secondary | ICD-10-CM

## 2018-10-02 DIAGNOSIS — J90 Pleural effusion, not elsewhere classified: Secondary | ICD-10-CM | POA: Insufficient documentation

## 2018-10-02 DIAGNOSIS — Z833 Family history of diabetes mellitus: Secondary | ICD-10-CM

## 2018-10-02 DIAGNOSIS — R000 Tachycardia, unspecified: Secondary | ICD-10-CM | POA: Insufficient documentation

## 2018-10-02 DIAGNOSIS — Z8546 Personal history of malignant neoplasm of prostate: Secondary | ICD-10-CM

## 2018-10-02 DIAGNOSIS — D72829 Elevated white blood cell count, unspecified: Secondary | ICD-10-CM | POA: Diagnosis not present

## 2018-10-02 DIAGNOSIS — C3431 Malignant neoplasm of lower lobe, right bronchus or lung: Secondary | ICD-10-CM | POA: Diagnosis present

## 2018-10-02 DIAGNOSIS — I482 Chronic atrial fibrillation, unspecified: Principal | ICD-10-CM | POA: Diagnosis present

## 2018-10-02 DIAGNOSIS — Z6836 Body mass index (BMI) 36.0-36.9, adult: Secondary | ICD-10-CM

## 2018-10-02 DIAGNOSIS — E785 Hyperlipidemia, unspecified: Secondary | ICD-10-CM | POA: Insufficient documentation

## 2018-10-02 DIAGNOSIS — Z955 Presence of coronary angioplasty implant and graft: Secondary | ICD-10-CM | POA: Diagnosis not present

## 2018-10-02 DIAGNOSIS — I959 Hypotension, unspecified: Secondary | ICD-10-CM | POA: Insufficient documentation

## 2018-10-02 DIAGNOSIS — N179 Acute kidney failure, unspecified: Secondary | ICD-10-CM | POA: Diagnosis present

## 2018-10-02 DIAGNOSIS — R630 Anorexia: Secondary | ICD-10-CM | POA: Insufficient documentation

## 2018-10-02 DIAGNOSIS — E669 Obesity, unspecified: Secondary | ICD-10-CM | POA: Insufficient documentation

## 2018-10-02 DIAGNOSIS — R0602 Shortness of breath: Secondary | ICD-10-CM | POA: Insufficient documentation

## 2018-10-02 DIAGNOSIS — R918 Other nonspecific abnormal finding of lung field: Secondary | ICD-10-CM | POA: Insufficient documentation

## 2018-10-02 DIAGNOSIS — I509 Heart failure, unspecified: Secondary | ICD-10-CM | POA: Insufficient documentation

## 2018-10-02 DIAGNOSIS — E119 Type 2 diabetes mellitus without complications: Secondary | ICD-10-CM | POA: Insufficient documentation

## 2018-10-02 DIAGNOSIS — R590 Localized enlarged lymph nodes: Secondary | ICD-10-CM | POA: Insufficient documentation

## 2018-10-02 DIAGNOSIS — Z885 Allergy status to narcotic agent status: Secondary | ICD-10-CM

## 2018-10-02 LAB — CBC WITH DIFFERENTIAL/PLATELET
Abs Immature Granulocytes: 0.08 10*3/uL — ABNORMAL HIGH (ref 0.00–0.07)
Basophils Absolute: 0.1 10*3/uL (ref 0.0–0.1)
Basophils Relative: 1 %
Eosinophils Absolute: 0.3 10*3/uL (ref 0.0–0.5)
Eosinophils Relative: 2 %
HCT: 35.4 % — ABNORMAL LOW (ref 39.0–52.0)
Hemoglobin: 10.9 g/dL — ABNORMAL LOW (ref 13.0–17.0)
Immature Granulocytes: 1 %
Lymphocytes Relative: 11 %
Lymphs Abs: 1.7 10*3/uL (ref 0.7–4.0)
MCH: 26.6 pg (ref 26.0–34.0)
MCHC: 30.8 g/dL (ref 30.0–36.0)
MCV: 86.3 fL (ref 80.0–100.0)
Monocytes Absolute: 1.1 10*3/uL — ABNORMAL HIGH (ref 0.1–1.0)
Monocytes Relative: 7 %
Neutro Abs: 13 10*3/uL — ABNORMAL HIGH (ref 1.7–7.7)
Neutrophils Relative %: 78 %
Platelets: 349 10*3/uL (ref 150–400)
RBC: 4.1 MIL/uL — ABNORMAL LOW (ref 4.22–5.81)
RDW: 15.4 % (ref 11.5–15.5)
WBC: 16.3 10*3/uL — ABNORMAL HIGH (ref 4.0–10.5)
nRBC: 0.2 % (ref 0.0–0.2)

## 2018-10-02 LAB — BASIC METABOLIC PANEL
Anion gap: 11 (ref 5–15)
BUN: 33 mg/dL — ABNORMAL HIGH (ref 8–23)
CO2: 23 mmol/L (ref 22–32)
Calcium: 10.7 mg/dL — ABNORMAL HIGH (ref 8.9–10.3)
Chloride: 102 mmol/L (ref 98–111)
Creatinine, Ser: 1.51 mg/dL — ABNORMAL HIGH (ref 0.61–1.24)
GFR calc Af Amer: 50 mL/min — ABNORMAL LOW (ref >60)
GFR calc non Af Amer: 43 mL/min — ABNORMAL LOW (ref >60)
Glucose, Bld: 181 mg/dL — ABNORMAL HIGH (ref 70–99)
Potassium: 4.3 mmol/L (ref 3.5–5.1)
Sodium: 136 mmol/L (ref 135–145)

## 2018-10-02 LAB — BRAIN NATRIURETIC PEPTIDE: B Natriuretic Peptide: 35.6 pg/mL (ref 0.0–100.0)

## 2018-10-02 LAB — SARS CORONAVIRUS 2 BY RT PCR (HOSPITAL ORDER, PERFORMED IN ~~LOC~~ HOSPITAL LAB): SARS Coronavirus 2: NEGATIVE

## 2018-10-02 LAB — PROTIME-INR
INR: 1.8 — ABNORMAL HIGH (ref 0.8–1.2)
Prothrombin Time: 20.2 seconds — ABNORMAL HIGH (ref 11.4–15.2)

## 2018-10-02 LAB — TROPONIN I: Troponin I: <0.03 ng/mL (ref <0.03)

## 2018-10-02 LAB — MAGNESIUM: Magnesium: 2.1 mg/dL (ref 1.7–2.4)

## 2018-10-02 MED ORDER — ALLOPURINOL 300 MG PO TABS
300.0000 mg | ORAL_TABLET | Freq: Every day | ORAL | Status: DC
  Administered 2018-10-03 – 2018-10-04 (×2): 300 mg via ORAL
  Filled 2018-10-02 (×2): qty 1

## 2018-10-02 MED ORDER — MIRTAZAPINE 15 MG PO TABS
15.0000 mg | ORAL_TABLET | Freq: Every day | ORAL | Status: DC
  Administered 2018-10-02 – 2018-10-03 (×2): 15 mg via ORAL
  Filled 2018-10-02 (×2): qty 1

## 2018-10-02 MED ORDER — DILTIAZEM HCL 100 MG IV SOLR
5.0000 mg/h | INTRAVENOUS | Status: DC
  Administered 2018-10-02: 15 mg/h via INTRAVENOUS
  Administered 2018-10-02: 15:00:00 5 mg/h via INTRAVENOUS
  Administered 2018-10-03 (×2): 15 mg/h via INTRAVENOUS
  Filled 2018-10-02 (×5): qty 100

## 2018-10-02 MED ORDER — DILTIAZEM LOAD VIA INFUSION
10.0000 mg | Freq: Once | INTRAVENOUS | Status: AC
  Administered 2018-10-02: 10 mg via INTRAVENOUS
  Filled 2018-10-02: qty 10

## 2018-10-02 MED ORDER — SODIUM CHLORIDE 0.9 % IV BOLUS
250.0000 mL | Freq: Once | INTRAVENOUS | Status: AC
  Administered 2018-10-02: 250 mL via INTRAVENOUS

## 2018-10-02 MED ORDER — SODIUM CHLORIDE 0.9 % IV SOLN
INTRAVENOUS | Status: DC
  Administered 2018-10-02: 15:00:00 via INTRAVENOUS

## 2018-10-02 MED ORDER — APIXABAN 5 MG PO TABS
5.0000 mg | ORAL_TABLET | Freq: Two times a day (BID) | ORAL | Status: DC
  Administered 2018-10-02 – 2018-10-04 (×4): 5 mg via ORAL
  Filled 2018-10-02 (×4): qty 1

## 2018-10-02 MED ORDER — SODIUM CHLORIDE 0.9% FLUSH: 3.0000 mL | Freq: Once | INTRAVENOUS | Status: DC

## 2018-10-02 MED ORDER — SODIUM CHLORIDE 0.9 % IV SOLN
INTRAVENOUS | Status: DC
  Administered 2018-10-02 – 2018-10-03 (×3): via INTRAVENOUS

## 2018-10-02 MED ORDER — SODIUM CHLORIDE 0.9 % IV BOLUS
500.0000 mL | Freq: Once | INTRAVENOUS | Status: AC
  Administered 2018-10-02: 15:00:00 500 mL via INTRAVENOUS

## 2018-10-02 MED ORDER — DILTIAZEM LOAD VIA INFUSION
15.0000 mg | Freq: Once | INTRAVENOUS | Status: AC
  Administered 2018-10-02: 15 mg via INTRAVENOUS
  Filled 2018-10-02: qty 15

## 2018-10-02 NOTE — Patient Instructions (Signed)
Coronavirus (COVID-19) Are you at risk?  Are you at risk for the Coronavirus (COVID-19)?  To be considered HIGH RISK for Coronavirus (COVID-19), you have to meet the following criteria:  . Traveled to [place], [place], [place], [place] or [place]; or in the [place] to [place], [place], [place], or [place]; and have fever, cough, and shortness of breath within the last 2 weeks of travel OR . Been in close contact with a person diagnosed with COVID-19 within the last 2 weeks and have fever, cough, and shortness of breath . IF YOU DO NOT MEET THESE CRITERIA, YOU ARE CONSIDERED LOW RISK FOR COVID-19.  What to do if you are HIGH RISK for COVID-19?  . If you are having a medical emergency, call 911. . Seek medical care right away. Before you go to a doctor's office, urgent care or emergency department, call ahead and tell them about your recent travel, contact with someone diagnosed with COVID-19, and your symptoms. You should receive instructions from your physician's office regarding next steps of care.  . When you arrive at healthcare provider, tell the healthcare staff immediately you have returned from visiting [place], [place], [place], [place] or [place]; or traveled in the [place] to [place], [place], [place], or [place]; in the last two weeks or you have been in close contact with a person diagnosed with COVID-19 in the last 2 weeks.   . Tell the health care staff about your symptoms: fever, cough and shortness of breath. . After you have been seen by a medical provider, you will be either: o Tested for (COVID-19) and discharged home on quarantine except to seek medical care if symptoms worsen, and asked to  - Stay home and avoid contact with others until you get your results (4-5 days)  - Avoid travel on public transportation if possible (such as bus, train, or airplane) or o Sent to the Emergency Department by EMS for evaluation, COVID-19 testing, and possible  admission depending on your condition and test results.  What to do if you are LOW RISK for COVID-19?  Reduce your risk of any infection by using the same precautions used for avoiding the common cold or flu:  . Wash your hands often with soap and warm water for at least 20 seconds.  If soap and water are not readily available, use an alcohol-based hand sanitizer with at least 60% alcohol.  . If coughing or sneezing, cover your mouth and nose by coughing or sneezing into the elbow areas of your shirt or coat, into a tissue or into your sleeve (not your hands). . Avoid shaking hands with others and consider head nods or verbal greetings only. . Avoid touching your eyes, nose, or mouth with unwashed hands.  . Avoid close contact with people who are sick. . Avoid places or events with large numbers of people in one location, like concerts or sporting events. . Carefully consider travel plans you have or are making. . If you are planning any travel outside or inside the [place], visit the CDC's Travelers' Health webpage for the latest health notices. . If you have some symptoms but not all symptoms, continue to monitor at home and seek medical attention if your symptoms worsen. . If you are having a medical emergency, call 911.   ADDITIONAL HEALTHCARE OPTIONS FOR PATIENTS  [facility] / e-Visit: [email or web address]         [facility]: [phone]  [facility]: [phone]                   [facility]: [phone]   

## 2018-10-02 NOTE — Progress Notes (Signed)
Pt was found to be tachycardic, hypotensive, and symptomatic.  Pt was escorted to [facility] ED via [place] and supplemental oxygen via [place] at 2 lpm without incident. This RN gave report to charge RN, Lilia Pro. Pt's wife Peter Congo was aware of transfer to ED and agreeable.   Loma Sousa, RN BSN

## 2018-10-02 NOTE — H&P (Addendum)
History and Physical    Roger Blackwell [name]:[id number] DOB: 08-12-1935 DOA: 10/02/2018  PCP: [name], MD   Patient coming from: Home   Chief Complaint:   HPI: Roger Blackwell is a 83 y.o. male with medical history significant of recently diagnosed right lower lobe squamous cell carcinoma, prostate cancer in remission,atrial fibrillation, diastolic CHF,Diabetes ,Gout, necrotizing pneumonia who presents to the emergency department with complaints of shortness of breath and palpitations.  Patient is a  poor historian. Patient is on Cardizem at home.  Takes Eliquis for anticoagulation.He follows with cardiology for his A. fib and CHF.  Patient earlier today presented to the cancer center for the follow-up of his lung cancer.  He was found to be hypotensive and tachycardic.  Thus he  was sent to the emergency department for further evaluation.  He was found to be in A. fib with RVR on presentation.  He was admitted last month and discharged on 09/04/2018 with new onset A. fib with RVR.  He was managed and discharged on 05/25/2022 necrotizing right lower lobe pneumonia. Patient states he is compliant on his medications.  He denies any cough, chest pain, fever, chills, known COVID positive exposures, abdominal  pain or dysuria, nausea, vomiting or diarrhea.  ED Course: Patient was started on Cardizem drip with improvement of the heart rate.  He was seen and examined at the bedside.  Looked comfortable and says he is feeling better. ED discussed with cardiology.    Past Medical History:  Diagnosis Date   CAD (coronary artery disease)    CHF (congestive heart failure) (HCC)    DM (diabetes mellitus) ([place])    Dyslipidemia    Gout    Morbid obesity ([place])    Systemic hypertension     Past Surgical History:  Procedure Laterality Date   BACK SURGERY     CORONARY ANGIOPLASTY WITH STENT PLACEMENT  12/26/2000   80% prox. circumflex lesion w/successful stent placement.   NM MYOVIEW LTD   08/31/2010   Normal   PERICARDIOCENTESIS     PROSTATE SURGERY     US ECHOCARDIOGRAPHY  08/31/2010   EF 50-55%,RV mildly dilated,mild aortic root dilatation   VIDEO BRONCHOSCOPY Bilateral 05/23/2018   Procedure: VIDEO BRONCHOSCOPY WITHOUT FLUORO;  Surgeon: Marshell Garfinkel, MD;  Location: [facility];  Service: Cardiopulmonary;  Laterality: Bilateral;     reports that he quit smoking about 25 years ago. His smoking use included cigarettes. He has never used smokeless tobacco. He reports current alcohol use. He reports that he does not use drugs.  Allergies  Allergen Reactions   Codeine Nausea And Vomiting and Other (See Comments)    Made the patient feel badly, also   Ivp Dye [Iodinated Diagnostic Agents] Other (See Comments)    Blisters all over the body    Family History  Problem Relation Age of Onset   Diabetes Brother    Hypertension Sister      Prior to Admission medications   Medication Sig Start Date End Date Taking? Authorizing Provider  albuterol (PROVENTIL HFA;VENTOLIN HFA) 108 (90 Base) MCG/ACT inhaler Inhale 2 puffs into the lungs every 6 (six) hours as needed for wheezing or shortness of breath. 08/30/18  Yes Ivin Poot, MD  allopurinol (ZYLOPRIM) 100 MG tablet Take 300 mg by mouth daily.    Yes [provider]  apixaban (ELIQUIS) 5 MG TABS tablet Take 1 tablet (5 mg total) by mouth 2 (two) times daily. 09/04/18  Yes Roxan Hockey, MD  diltiazem (CARDIZEM  CD) 120 MG 24 hr capsule Take 1 capsule (120 mg total) by mouth daily. 09/05/18  Yes Emokpae, Courage, MD  ipratropium-albuterol (DUONEB) 0.5-2.5 (3) MG/3ML SOLN Take 3 mLs by nebulization every 6 (six) hours as needed. 09/14/18  Yes Tanda Rockers, MD  iron polysaccharides (NIFEREX) 150 MG capsule Take 150 mg by mouth every evening.    Yes [provider]  mirtazapine (REMERON) 15 MG tablet Take 1 tablet (15 mg total) by mouth at bedtime. 09/04/18 09/04/19 Yes Emokpae, Courage, MD  Potassium  99 MG TABS Take 99 mg by mouth every other day.    Yes [provider]  Travoprost, BAK Free, (TRAVATAN) 0.004 % SOLN ophthalmic solution Place 1 drop into both eyes at bedtime.   Yes [provider]  furosemide (LASIX) 40 MG tablet Take 1 tablet (40 mg total) by mouth daily as needed for fluid or edema. Patient not taking: Reported on 10/02/2018 09/04/18   Roxan Hockey, MD  insulin lispro (HUMALOG KWIKPEN) 100 UNIT/ML KwikPen Inject 0.03 mLs (3 Units total) into the skin See admin instructions. Inject 3 units into the skin with meals as needed for a BGL reading of 150 or greater Patient not taking: Reported on 10/02/2018 09/04/18   Roxan Hockey, MD  LEVEMIR FLEXTOUCH 100 UNIT/ML Pen Inject 18 Units into the skin See admin instructions. Inject 18 units into the skin at bedtime if BGL is 170-180 or greater Patient not taking: Reported on 10/02/2018 09/04/18   Roxan Hockey, MD    Physical Exam: Vitals:   10/02/18 1530 10/02/18 1600 10/02/18 1625 10/02/18 1642  BP: 121/90 104/70  126/79  Pulse: (!) 57  (!) 106 (!) 147  Resp: (!) _0 Temp:      TempSrc:      SpO2: 98%  100% 100%  Weight:      Height:        Constitutional: Not in distress Vitals:   10/02/18 1530 10/02/18 1600 10/02/18 1625 10/02/18 1642  BP: 121/90 104/70  126/79  Pulse: (!) 57  (!) 106 (!) 147  Resp: (!) _1 Temp:      TempSrc:      SpO2: 98%  100% 100%  Weight:      Height:       Eyes: PERRL, lids and conjunctivae normal ENMT: Mucous membranes are moist. Posterior pharynx clear of any exudate or lesions.Normal dentition.  Neck: normal, supple, no masses, no thyromegaly Respiratory: clear to auscultation bilaterally, no wheezing, no crackles. Normal respiratory effort. No accessory muscle use.  Cardiovascular: Afib with RVR, no murmurs / rubs / gallops. No extremity edema. 2+ pedal pulses. No carotid bruits.  Abdomen: no tenderness, no masses palpated. No  hepatosplenomegaly. Bowel sounds positive.  Musculoskeletal: no clubbing / cyanosis. No joint deformity upper and lower extremities. Good ROM, no contractures. Normal muscle tone.  Skin: no rashes, lesions, ulcers. No induration Neurologic: CN 2-12 grossly intact. Sensation intact, DTR normal. Strength 5/5 in all 4.  Psychiatric: Normal judgment and insight. Alert and oriented x 3. Normal mood.   Foley Catheter:None  Labs on Admission: I have personally reviewed following labs and imaging studies  CBC: Recent Labs  Lab 10/02/18 1421  WBC 16.3*  NEUTROABS 13.0*  HGB 10.9*  HCT 35.4*  MCV 86.3  PLT 562   Basic Metabolic Panel: Recent Labs  Lab 10/02/18 1419 10/02/18 1421  NA 136  --   K 4.3  --   CL  102  --   CO2 23  --   GLUCOSE 181*  --   BUN 33*  --   CREATININE 1.51*  --   CALCIUM 10.7*  --   MG  --  2.1   GFR: Estimated Creatinine Clearance: 50.9 mL/min (A) (by C-G formula based on SCr of 1.51 mg/dL (H)). Liver Function Tests: No results for input(s): AST, ALT, ALKPHOS, BILITOT, PROT, ALBUMIN in the last 168 hours. No results for input(s): LIPASE, AMYLASE in the last 168 hours. No results for input(s): AMMONIA in the last 168 hours. Coagulation Profile: Recent Labs  Lab 10/02/18 1419  INR 1.8*   Cardiac Enzymes: Recent Labs  Lab 10/02/18 1421  TROPONINI <0.03   BNP (last 3 results) No results for input(s): PROBNP in the last 8760 hours. HbA1C: No results for input(s): HGBA1C in the last 72 hours. CBG: No results for input(s): GLUCAP in the last 168 hours. Lipid Profile: No results for input(s): CHOL, HDL, LDLCALC, TRIG, CHOLHDL, LDLDIRECT in the last 72 hours. Thyroid Function Tests: No results for input(s): TSH, T4TOTAL, FREET4, T3FREE, THYROIDAB in the last 72 hours. Anemia Panel: No results for input(s): VITAMINB12, FOLATE, FERRITIN, TIBC, IRON, RETICCTPCT in the last 72 hours. Urine analysis:    Component Value Date/Time   COLORURINE YELLOW  05/19/2018 1739   APPEARANCEUR HAZY (A) 05/19/2018 1739   LABSPEC 1.016 05/19/2018 1739   PHURINE 5.0 05/19/2018 1739   GLUCOSEU NEGATIVE 05/19/2018 1739   HGBUR SMALL (A) 05/19/2018 1739   BILIRUBINUR NEGATIVE 05/19/2018 1739   KETONESUR NEGATIVE 05/19/2018 1739   PROTEINUR NEGATIVE 05/19/2018 1739   UROBILINOGEN 0.2 07/14/2007 1105   NITRITE NEGATIVE 05/19/2018 1739   LEUKOCYTESUR MODERATE (A) 05/19/2018 1739    Radiological Exams on Admission: Dg Chest Port 1 View  Result Date: 10/02/2018 CLINICAL DATA:  Atrial fibrillation with rapid ventricular response and hypotension. History of squamous cell lung cancer. EXAM: PORTABLE CHEST 1 VIEW COMPARISON:  Radiographs and CT 09/02/2018. FINDINGS: 1408 hours. The heart size and mediastinal contours are grossly stable with persistent right hilar prominence. The known cavitary lesion posteriorly in the right hemithorax is partly obscured by pleuroparenchymal opacity. Pleural calcifications are present bilaterally. Overall appearance is similar to the prior radiographs, and no superimposed airspace disease or pneumothorax identified. The bones appear unchanged. IMPRESSION: No significant change is seen from the prior studies of 1 month ago. No acute superimposed process identified. Electronically Signed   By: Richardean Sale M.D.   On: 10/02/2018 14:46     Assessment/Plan Principal Problem:   Atrial fibrillation with RVR (HCC) Active Problems:   CAD s/p left circumflex coronary stents 2002   Chronic diastolic heart failure (HCC)   Essential hypertension   Type 2 diabetes mellitus with stage 3 chronic kidney disease, with long-term current use of insulin (HCC)   Dyslipidemia   CKD (chronic kidney disease) stage 3, GFR 30-59 ml/min (HCC)   Gout   A-fib (HCC)  A. fib with RVR: Started on Cardizem drip.  On oral Cardizem at home.  Heart rate improving.  Continue anticoagulation with Eliquis.  If his heart rate remains uncontrolled tomorrow,  consider cardiology evaluation.  ED discussed with cardiology earlier today. CHA2DS2- VASc score  of 5   Squamous cell carcinoma of the right lower lobe:As per biopsy on 08/23/2018. Likely stage I-II tumor.Follows with oncology, radiation oncology.  Had a PET scan recently.  Also followed with cardiothoracic surgery but he he was not found to be a good candidate  for surgery for his cancer.Plan for PDL1 testing.Not started on treatment yet.Plan for radiation therapy.  History of prostate cancer: Status post radiation in 1994.  Currently in remission.  CHF with preserved ejection fraction: Follows with cardiology.  On Lasix as needed.  Recent echocardiogram showed normal left ventricular ejection fraction, impaired relaxation.  Currently euvolemic.  History of coronary disease: S/P PCI.Currently stable.  Continue current meds.  Diabetes mellitus: On insulin at home.  Continue sliding-scale insulin here.  Last hemoglobin A1c of 5.9.  Patient has a recent history of significant weight loss.  AKI on CKD stage II-III: Baseline creatinine around 1.2-1.4.  Mild elevated creatinine from baseline.  Continue gentle IV fluids.  Check BMP tomorrow.  Gout: Continue allopurinol  Hypertension: Hypotensive at [facility].  Blood pressure stable currently.  On Cardizem at home.  Leukocytosis: Most likely reactive.  No indication of antibiotic therapy for now.  Continue to monitor.   Severity of Illness: The appropriate patient status for this patient is OBSERVATION.     DVT prophylaxis: Eliquis Code Status: Full Family Communication: None present at the bed side Consults called: ED discussed with cardiology.     Shelly Coss MD [facility] Pager [phone]  If 7PM-7AM, please contact night-coverage [email or web address] Password [email or web address]  10/02/2018, 4:44 PM

## 2018-10-02 NOTE — ED Notes (Signed)
Called [facility], asked if it was okay to transfer pt upstairs, and they said yes. Transferring pt in 5-10 minutes.

## 2018-10-02 NOTE — Progress Notes (Signed)
Radiation Oncology         [phone] ________________________________  Initial Outpatient Consultation  Name: Roger Blackwell MRN: [id number]  Date: 10/02/2018  DOB: 1935/10/12  LS:LHTDS, Thayer Jew, MD  Brunetta Genera, MD   REFERRING PHYSICIAN: Brunetta Genera, MD  DIAGNOSIS: The encounter diagnosis was Primary cancer of right lower lobe of lung [facility]). Presumptive Stage III non-small cell lung cancer (Squamous cell carcinoma) presenting in the right lower lung  HISTORY OF PRESENT ILLNESS::Roger Blackwell is a 83 y.o. male who is presenting to the office today for evaluation of lung cancer. He has a history of prostate cancer and radiation to his pelvis in 1994.  He was admitted to the hospital in January because of hemoptysis and probable right lower lobe pneumonia with small pleural effusion.  He was treated with antibiotics and discharged 05/25/18. He met with Dr. Prescott Gum in Cardiothoracic Surgery on 3/17, who deemed him to be not a candidate for surgery due to numerous comorbidities (HTN, DM2, HLD, CHF, CAD). Pt was recently admitted to the hospital from the ED on 4/18 after presenting with worsening SOB. He was discharged 4/20 with a diagnosis of Atrial fibrillation with rapid ventricular response. He is here today to discuss radiation options for his right lower lung mass.   He underwent chest CT 05/18/18 for hemoptysis which showed extensive consolidation and airspace disease in the right lower lobe. Small pockets of gas along the periphery of the right lung consolidation were concerning for lung necrosis. In addition, there was an adjacent small complex right pleural effusion that contained gas. The overall pattern of disease was concerning for a severe infectious or inflammatory process but neoplasm could not be excluded, particularly since there were nodular lesions in both lungs. Small amount of gas in the right pleural space raised concern for a bronchopleural fistula.  Scattered pleural calcifications with plaques were also noted. Findings compatible with prior asbestos exposure. Indeterminate 0.9 cm nodule in the left upper lobe and poorly defined pleural-based nodular lesion/mass in the left upper lobe. These indeterminate left lung lesions could be infectious or neoplastic.  Cytology from right lower lobe bronchial washing on 05/23/18 showed benign reactive/reparative changes. Pathology from the same day showed benign bronchial tissue.  He had a PET scan on 07/10/18. This showed a relatively similar appearance of the right lower lobe compared to 05/18/2018. Linear hypermetabolism corresponding to consolidation with immediately subjacent lung necrosis and developing pulmonary abscess. Presumably secondary small right hydropneumothorax suggestive of bronchopleural fistula. This process was favored to be infectious. Consider atypical pneumonia or chronic aspiration. Persistent right lower lobe hypermetabolic mass with central necrosis or developing cavitation. This was indeterminate and could be infectious/inflammatory or neoplastic. Low mediastinal hypermetabolic adenopathy. Possibly reactive. Right greater than left hypermetabolic inguinal nodes. Not in a typical drainage pattern for thoracic primary. Given prostatectomy and prior pelvic node dissection, considerations include reactive versus prostate cancer metastasis. Asbestos related pleural disease. Pulmonary nodules detailed previously, below PET resolution. Subcutaneous nodule superficial the left gluteal muscular sugar may be a site of injection.   A biopsy was performed on 08/23/18. This confirmed squamous cell carcinoma in his right lower lobe.  Another CT was performed on 09/02/18. This revealed stable appearing cavitary lesion within the right lower lobe similar to that seen on prior PET-CT consistent with the known history of squamous cell carcinoma. Persistent right hilar density is noted surrounding the  bronchus intermedius and lower lobe bronchial tree consistent with progressive neoplastic involvement. Stable subcarinal involvement  is noted as well. Changes consistent with prior asbestos exposure. Persistent air is noted within the pleural space on the right similar to that seen on prior PET-CT. No new focal abnormality is noted.  He reports 40-50 lb weight loss, decreased appetite, occasional night sweats, SOB worsened with exertion, and coughing white/yellow mucus. He denies chest pain, hemoptysis and any other symptoms.    PREVIOUS RADIATION THERAPY: Yes EBRT to pelvis in [place]:  has a past medical history of CAD (coronary artery disease), CHF (congestive heart failure) ([place]), DM (diabetes mellitus) ([place]), Dyslipidemia, Gout, Morbid obesity ([place]), and Systemic hypertension.    PAST SURGICAL HISTORY: Past Surgical History:  Procedure Laterality Date   BACK SURGERY     CORONARY ANGIOPLASTY WITH STENT PLACEMENT  12/26/2000   80% prox. circumflex lesion w/successful stent placement.   NM MYOVIEW LTD  08/31/2010   Normal   PERICARDIOCENTESIS     PROSTATE SURGERY     US ECHOCARDIOGRAPHY  08/31/2010   EF 50-55%,RV mildly dilated,mild aortic root dilatation   VIDEO BRONCHOSCOPY Bilateral 05/23/2018   Procedure: VIDEO BRONCHOSCOPY WITHOUT FLUORO;  Surgeon: Marshell Garfinkel, MD;  Location: [facility];  Service: Cardiopulmonary;  Laterality: Bilateral;    FAMILY HISTORY: family history includes Diabetes in his brother; Hypertension in his sister.  SOCIAL HISTORY:  reports that he quit smoking about 25 years ago. His smoking use included cigarettes. He has never used smokeless tobacco. He reports current alcohol use. He reports that he does not use drugs.  ALLERGIES: Codeine and Ivp dye [iodinated diagnostic agents]  MEDICATIONS:  No current facility-administered medications for this encounter.    Current Outpatient Medications  Medication Sig Dispense Refill    albuterol (PROVENTIL HFA;VENTOLIN HFA) 108 (90 Base) MCG/ACT inhaler Inhale 2 puffs into the lungs every 6 (six) hours as needed for wheezing or shortness of breath. 1 Inhaler 2   allopurinol (ZYLOPRIM) 100 MG tablet Take 300 mg by mouth daily.      apixaban (ELIQUIS) 5 MG TABS tablet Take 1 tablet (5 mg total) by mouth 2 (two) times daily. 60 tablet 4   diltiazem (CARDIZEM CD) 120 MG 24 hr capsule Take 1 capsule (120 mg total) by mouth daily. 30 capsule 2   ipratropium-albuterol (DUONEB) 0.5-2.5 (3) MG/3ML SOLN Take 3 mLs by nebulization every 6 (six) hours as needed. 360 mL 11   iron polysaccharides (NIFEREX) 150 MG capsule Take 150 mg by mouth every evening.      mirtazapine (REMERON) 15 MG tablet Take 1 tablet (15 mg total) by mouth at bedtime. 30 tablet 2   Potassium 99 MG TABS Take 99 mg by mouth every other day.      Travoprost, BAK Free, (TRAVATAN) 0.004 % SOLN ophthalmic solution Place 1 drop into both eyes at bedtime.     furosemide (LASIX) 40 MG tablet Take 1 tablet (40 mg total) by mouth daily as needed for fluid or edema. (Patient not taking: Reported on 10/02/2018) 30 tablet 0   insulin lispro (HUMALOG KWIKPEN) 100 UNIT/ML KwikPen Inject 0.03 mLs (3 Units total) into the skin See admin instructions. Inject 3 units into the skin with meals as needed for a BGL reading of 150 or greater (Patient not taking: Reported on 10/02/2018) 15 mL 11   LEVEMIR FLEXTOUCH 100 UNIT/ML Pen Inject 18 Units into the skin See admin instructions. Inject 18 units into the skin at bedtime if BGL is 170-180 or greater (Patient not taking: Reported on 10/02/2018) 15 mL  11   Facility-Administered Medications Ordered in Other Encounters  Medication Dose Route Frequency Provider Last Rate Last Dose   0.9 %  sodium chloride infusion   Intravenous Continuous Shelly Coss, MD   Stopped at 10/02/18 1708   0.9 %  sodium chloride infusion   Intravenous Continuous Shelly Coss, MD 75 mL/hr at 10/02/18 1708      allopurinol (ZYLOPRIM) tablet 300 mg  300 mg Oral Daily Adhikari, Tamsen Meek, MD       apixaban (ELIQUIS) tablet 5 mg  5 mg Oral BID Shelly Coss, MD       diltiazem (CARDIZEM) 100 mg in dextrose 5 % 100 mL (1 mg/mL) infusion  5-15 mg/hr Intravenous Continuous Shelly Coss, MD 15 mL/hr at 10/02/18 1804 15 mg/hr at 10/02/18 1804   mirtazapine (REMERON) tablet 15 mg  15 mg Oral QHS Adhikari, Amrit, MD       sodium chloride flush (NS) 0.9 % injection 3 mL  3 mL Intravenous Once Shelly Coss, MD        REVIEW OF SYSTEMS:  A 10+ POINT REVIEW OF SYSTEMS WAS OBTAINED including neurology, dermatology, psychiatry, cardiac, respiratory, lymph, extremities, GI, GU, musculoskeletal, constitutional, reproductive, HEENT. All pertinent positives are noted in the HPI. All others are negative.    PHYSICAL EXAM:  height is '6\' 3"'  (1.905 m). His oral temperature is 98 F (36.7 C). His blood pressure is 84/62 (abnormal) and his pulse is 110 (abnormal). His respiration is 20 and oxygen saturation is 97%.   General: Alert and oriented, in no acute distress HEENT: Head is normocephalic. Extraocular movements are intact. Oropharynx is clear. Dentures present along the mandible only. Neck: Neck is supple, no palpable cervical or supraclavicular lymphadenopathy. Heart: Significantly increased rate, ? afib with rvr Chest: Clear to auscultation bilaterally, with no rhonchi, wheezes, or rales. Abdomen: Soft, nontender, nondistended, with no rigidity or guarding. Extremities: No cyanosis or edema. Lymphatics: see Neck Exam Skin: No concerning lesions. Musculoskeletal: symmetric strength and muscle tone throughout. Neurologic: Cranial nerves II through XII are grossly intact. No obvious focalities. Speech is fluent. Coordination is intact. Psychiatric: Judgment and insight are intact. Affect is appropriate. Pt remained in a wheelchair throughout the evaluation.   ECOG = 2  0 - Asymptomatic (Fully active,  able to carry on all predisease activities without restriction)  1 - Symptomatic but completely ambulatory (Restricted in physically strenuous activity but ambulatory and able to carry out work of a light or sedentary nature. For example, light housework, office work)  2 - Symptomatic, <50% in bed during the day (Ambulatory and capable of all self care but unable to carry out any work activities. Up and about more than 50% of waking hours)  3 - Symptomatic, >50% in bed, but not bedbound (Capable of only limited self-care, confined to bed or chair 50% or more of waking hours)  4 - Bedbound (Completely disabled. Cannot carry on any self-care. Totally confined to bed or chair)  5 - Death   Eustace Pen MM, Creech RH, Tormey DC, et al. [phone]). "Toxicity and response criteria of the [facility] Group". [place] Oncol. 5 (6): 649-55  LABORATORY DATA:  Lab Results  Component Value Date   WBC 16.3 (H) 10/02/2018   HGB 10.9 (L) 10/02/2018   HCT 35.4 (L) 10/02/2018   MCV 86.3 10/02/2018   PLT 349 10/02/2018   NEUTROABS 13.0 (H) 10/02/2018   Lab Results  Component Value Date   NA 136 10/02/2018   K  4.3 10/02/2018   CL 102 10/02/2018   CO2 23 10/02/2018   GLUCOSE 181 (H) 10/02/2018   CREATININE 1.51 (H) 10/02/2018   CALCIUM 10.7 (H) 10/02/2018      RADIOGRAPHY: Ct Chest Wo Contrast  Result Date: 09/02/2018 CLINICAL DATA:  Increasing shortness of breath over the past 2 days, known history of lung carcinoma on the right, productive cough EXAM: CT CHEST WITHOUT CONTRAST TECHNIQUE: Multidetector CT imaging of the chest was performed following the standard protocol without IV contrast. COMPARISON:  PET-CT from 07/10/2018, CT of the chest from 05/18/2018 FINDINGS: Cardiovascular: Limited due to lack of IV contrast. Aortic calcifications are noted without aneurysmal dilatation. No cardiac enlargement is seen. Coronary calcifications are noted. Mediastinum/Nodes: Thoracic inlet is  within normal limits. Stable precarinal lymph node is noted. Persistent soft tissue density in the right hilar region surrounding the bronchus intermedius is noted when compared with the prior exam. This is consistent with hilar adenopathy and local extension of neoplasm. Some fullness in the subcarinal region is noted as well. Lungs/Pleura: Calcified pleural plaques are again noted and stable. There is again seen and extension cavitary lesion within the right lower lobe. The degree of cavitation is relatively stable from the prior PET-CT although the degree of fluid within the cavity has decreased. The more rounded inferior component recently biopsied is again identified with decreased cavitation and more fluid within. Some associated nodularity is noted likely related to local spread. No sizable effusion is seen. Air is noted within the pleural space on the right relatively stable from the recent PET-CT. Stable nodular density is noted in the left upper lobe best seen on image number 53 of series 6. Stable pleural plaquing is noted as well. Upper Abdomen: Visualized upper abdomen is within normal limits. Evaluation is somewhat limited again due to the lack of IV contrast. Musculoskeletal: Degenerative changes of the thoracic spine are noted. No lytic or sclerotic lesion is identified. IMPRESSION: Stable appearing cavitary lesion within the right lower lobe similar to that seen on prior PET-CT consistent with the known history of squamous cell carcinoma. Persistent right hilar density is noted surrounding the bronchus intermedius and lower lobe bronchial tree consistent with progressive neoplastic involvement. Stable subcarinal involvement is noted as well. Changes consistent with prior asbestos exposure. Persistent air is noted within the pleural space on the right similar to that seen on prior PET-CT. No new focal abnormality is noted. Electronically Signed   By: Inez Catalina M.D.   On: 09/02/2018 22:52   Dg  Chest Port 1 View  Result Date: 10/02/2018 CLINICAL DATA:  Atrial fibrillation with rapid ventricular response and hypotension. History of squamous cell lung cancer. EXAM: PORTABLE CHEST 1 VIEW COMPARISON:  Radiographs and CT 09/02/2018. FINDINGS: 1408 hours. The heart size and mediastinal contours are grossly stable with persistent right hilar prominence. The known cavitary lesion posteriorly in the right hemithorax is partly obscured by pleuroparenchymal opacity. Pleural calcifications are present bilaterally. Overall appearance is similar to the prior radiographs, and no superimposed airspace disease or pneumothorax identified. The bones appear unchanged. IMPRESSION: No significant change is seen from the prior studies of 1 month ago. No acute superimposed process identified. Electronically Signed   By: Richardean Sale M.D.   On: 10/02/2018 14:46      IMPRESSION: Presumptive Stage III non-small cell lung cancer (Squamous cell carcinoma) presenting in the right lower lung  The pt has a marginal poor performance status to  proceed with radiation and radiosensitizing chemotherapy. Prior  to considering this treatment, we will order an MRI of the brain to complete his staging workup.  Consultation was with presence of his wife by speaker phone.   PLAN: Brain MRI and then consideration for a definitive course of radiation therapy +/- radiosensitizing chemotherapy.      Addendum: During the pt's evaluation he kept complaining of SOB, he had 2L of oxygen placed which helped with his symptoms. Repeat vital signs showed the pt to be significantly tachycardic with pulse to be between 150-160. The pt's pulse was also very weak. In light of these findings I recommended the pt be taken to the ER, which his wife agreed. Pt will be evaluated in the ER with potential admission later today.    ------------------------------------------------  Blair Promise, PhD, MD      This document serves as a record  of services personally performed by Gery Pray, MD. It was created on his behalf by Mary-Margaret Loma Messing, a trained medical scribe. The creation of this record is based on the scribe's personal observations and the provider's statements to them. This document has been checked and approved by the attending provider.

## 2018-10-02 NOTE — Progress Notes (Signed)
Thoracic Location of Tumor / Histology: RIGHT lower lobe   Biopsies revealed: 08/23/18:  Diagnosis Lung, biopsy, RLL - SQUAMOUS CELL CARCINOMA  Tobacco/Marijuana/Snuff/ETOH use: 82 yobm quit smoking 1994 at time of L chest surgery by Arlyce Dice for empyema (records suggest 2000 for date but pt thinks it was 1994)  Past/Anticipated interventions by cardiothoracic surgery, if any: Pt has seen Dr. Tharon Aquas Trigt III with cardiothoracic surgery yesterday 08/29/18, and is not deemed a candidate for surgery  Past/Anticipated interventions by medical oncology, if any: Per Dr. Irene Limbo 08/31/18: -Pt has seen Dr. Tharon Aquas Trigt III with cardiothoracic surgery yesterday 08/29/18, and is not deemed a candidate for surgery -Discussed the diagnosis with the pt of Non Small Cell Lung Cancer of squamous cell variety -Discussed that there is a concern on the PET/CT that the pt has enlarged lymph nodes in similar area: related to pneumonia vs inflammation vs malignancy ? -Will need to repeat an interval CT to ensure that the lymph nodes resolve and are not involved with malignancy, and would then refer pt to Rad Onc for consideration of RT if this looks like a Stage I-II tumor . -will get PDL1 testing if adequate tissue available. -palliative RT vs palliative Immunotherapy vs best supportive cares -per Dr Lawson Fiscal - patient is not a candidate for surgery. -Recommend continue follow up with PCP and Pulmonology to optimize other co-morbids -Advised optimizing nutritional and hydration statuses -Continue SL Vitamin B12 replacement -Continue follow-up with primary care physician for management of other medical comorbidities. -Ferritin at goal >200 in the setting of his CKD, and would like Saturation >20% -Pt has previously responded to IV Injectafer which has held his anemia -Recommended graded sports compression socks and elevating legs for ankle swelling   Will plan to get CT in 2-3 weeks (Patient went to ED and had  this done on 4/18) RTC with Dr Irene Limbo in 2-3 weeks with labs Referral to radiation oncology   Signs/Symptoms  Weight changes, if any: 40-50 lb weight loss, decreased appetite, occasional night sweats  Respiratory complaints, if any: Pt with obvious SOB and worsens with exertion  Hemoptysis, if any: pt states he coughs up white/yellow mucus at times and also hemoptysis, approximately a dime sized amount  Pain issues, if any:  Pt denies c/o pain in chest  SAFETY ISSUES:  Prior radiation? EBRT to pelvis in 1994  Pacemaker/ICD? No   Possible current pregnancy?N/A  Is the patient on methotrexate? No  Current Complaints / other details:  Pt presents today for initial consult with Dr. Sondra Come for Radiation Oncology. Pt's wife is on speaker phone during today's visit.  -Will need to repeat an interval CT to ensure that the lymph nodes resolve and are not involved with malignancy, and would then refer pt to Rad Onc for consideration of RT if this looks like a Stage I-II tumor  BP (!) 84/62 (BP Location: Left Arm, Patient Position: Sitting)   Pulse (!) 110   Temp 98 F (36.7 C) (Oral)   Resp 20   Ht '6\' 3"'  (1.905 m)   SpO2 97%   BMI 28.60 kg/m   Wt Readings from Last 3 Encounters:  09/04/18 228 lb 13.4 oz (103.8 kg)  08/30/18 248 lb (112.5 kg)  08/23/18 250 lb (113.4 kg)   Loma Sousa, RN BSN

## 2018-10-02 NOTE — ED Notes (Signed)
Bed: AT55 Expected date:  Expected time:  Means of arrival:  Comments: Cancer

## 2018-10-02 NOTE — ED Provider Notes (Signed)
[name] DEPT Provider Note   CSN: [id number] Arrival date & time: 10/02/18  1404    History   Chief Complaint Chief Complaint  Patient presents with  . Irregular Heart Beat  . Hypotension    HPI Roger Blackwell is a 83 y.o. male.     The history is provided by the patient. The history is limited by the condition of the patient (acuity of condition).   Pt was seen at 1420. Per pt, c/o gradual onset and persistence of constant "palpitations" for the past several days. Has been associated with SOB. Pt states he went to his scheduled Rad Onc MD appointment PTA, then was sent to the ED for evaluation of "hypotension and tachycardia." Pt endorses compliance with Cardizem and Eliquis PO. Denies cough, fevers, known COVID+ exposure. Denies CP, no abd pain, no back pain, no N/V/D.    Past Medical History:  Diagnosis Date  . CAD (coronary artery disease)   . CHF (congestive heart failure) ([place])   . DM (diabetes mellitus) ([place])   . Dyslipidemia   . Gout   . Morbid obesity ([place])   . Systemic hypertension     Patient Active Problem List   Diagnosis Date Noted  . Atrial fibrillation with RVR ([place]) 09/02/2018  . DOE (dyspnea on exertion) 07/17/2018  . Necrotizing pneumonia ([place]) 06/15/2018  . S/P bronchoscopy with biopsy   . Malnutrition of moderate degree 05/21/2018  . Hemoptysis 05/20/2018  . Weight loss   . Cough with hemoptysis 05/19/2018  . Acute diastolic CHF (congestive heart failure) ([place]) 05/19/2018  . Gout 05/19/2018  . Community acquired pneumonia of right lung   . CKD (chronic kidney disease) stage 3, GFR 30-59 ml/min (HCC) 03/19/2018  . Iron deficiency anemia 08/10/2017  . Dyslipidemia 12/30/2014  . CAD s/p left circumflex coronary stents 2002 05/25/2013  . Class 2 severe obesity due to excess calories with serious comorbidity and body mass index (BMI) of 36.0 to 36.9 in adult ([place]) 05/25/2013  . Chronic diastolic heart failure  ([place]) 05/25/2013  . Hypercholesterolemia 05/25/2013  . Essential hypertension 05/25/2013  . Glaucoma 05/25/2013  . Type 2 diabetes mellitus with stage 3 chronic kidney disease, with long-term current use of insulin ([place]) 05/25/2013    Past Surgical History:  Procedure Laterality Date  . BACK SURGERY    . CORONARY ANGIOPLASTY WITH STENT PLACEMENT  12/26/2000   80% prox. circumflex lesion w/successful stent placement.  Marland Kitchen NM MYOVIEW LTD  08/31/2010   Normal  . PERICARDIOCENTESIS    . PROSTATE SURGERY    . US ECHOCARDIOGRAPHY  08/31/2010   EF 50-55%,RV mildly dilated,mild aortic root dilatation  . VIDEO BRONCHOSCOPY Bilateral 05/23/2018   Procedure: VIDEO BRONCHOSCOPY WITHOUT FLUORO;  Surgeon: Marshell Garfinkel, MD;  Location: [facility];  Service: Cardiopulmonary;  Laterality: Bilateral;        Home Medications    Prior to Admission medications   Medication Sig Start Date End Date Taking? Authorizing Provider  albuterol (PROVENTIL HFA;VENTOLIN HFA) 108 (90 Base) MCG/ACT inhaler Inhale 2 puffs into the lungs every 6 (six) hours as needed for wheezing or shortness of breath. 08/30/18  Yes Ivin Poot, MD  allopurinol (ZYLOPRIM) 100 MG tablet Take 300 mg by mouth daily.    Yes [provider]  apixaban (ELIQUIS) 5 MG TABS tablet Take 1 tablet (5 mg total) by mouth 2 (two) times daily. 09/04/18  Yes Emokpae, Courage, MD  diltiazem (CARDIZEM CD) 120 MG 24 hr capsule Take  1 capsule (120 mg total) by mouth daily. 09/05/18  Yes Emokpae, Courage, MD  ipratropium-albuterol (DUONEB) 0.5-2.5 (3) MG/3ML SOLN Take 3 mLs by nebulization every 6 (six) hours as needed. 09/14/18  Yes Tanda Rockers, MD  iron polysaccharides (NIFEREX) 150 MG capsule Take 150 mg by mouth every evening.    Yes [provider]  mirtazapine (REMERON) 15 MG tablet Take 1 tablet (15 mg total) by mouth at bedtime. 09/04/18 09/04/19 Yes Emokpae, Courage, MD  Potassium 99 MG TABS Take 99 mg by mouth every other  day.    Yes [provider]  Travoprost, BAK Free, (TRAVATAN) 0.004 % SOLN ophthalmic solution Place 1 drop into both eyes at bedtime.   Yes [provider]  furosemide (LASIX) 40 MG tablet Take 1 tablet (40 mg total) by mouth daily as needed for fluid or edema. Patient not taking: Reported on 10/02/2018 09/04/18   Roxan Hockey, MD  insulin lispro (HUMALOG KWIKPEN) 100 UNIT/ML KwikPen Inject 0.03 mLs (3 Units total) into the skin See admin instructions. Inject 3 units into the skin with meals as needed for a BGL reading of 150 or greater Patient not taking: Reported on 10/02/2018 09/04/18   Roxan Hockey, MD  LEVEMIR FLEXTOUCH 100 UNIT/ML Pen Inject 18 Units into the skin See admin instructions. Inject 18 units into the skin at bedtime if BGL is 170-180 or greater Patient not taking: Reported on 10/02/2018 09/04/18   Roxan Hockey, MD    Family History Family History  Problem Relation Age of Onset  . Diabetes Brother   . Hypertension Sister     Social History Social History   Tobacco Use  . Smoking status: Former Smoker    Types: Cigarettes    Last attempt to quit: 05/16/1993    Years since quitting: 25.3  . Smokeless tobacco: Never Used  Substance Use Topics  . Alcohol use: Yes    Comment: seldom  . Drug use: No     Allergies   Codeine and Ivp dye [iodinated diagnostic agents]   Review of Systems Review of Systems  Unable to perform ROS: Acuity of condition     Physical Exam Updated Vital Signs BP 104/70   Pulse (!) 106   Temp (!) 97.5 F (36.4 C) (Oral)   Resp 19   Ht 6\' 3"  (1.905 m)   Wt 103.8 kg   SpO2 100%   BMI 28.60 kg/m    Patient Vitals for the past 24 hrs:  BP Temp Temp src Pulse Resp SpO2 Height Weight  10/02/18 1642 126/79 - - (!) 147 20 100 % - -  10/02/18 1625 - - - (!) 106 19 100 % - -  10/02/18 1600 104/70 - - - 20 - - -  10/02/18 1530 121/90 - - (!) 57 (!) 22 98 % - -  10/02/18 1500 104/72 - - - 20 - - -  10/02/18  1455 118/69 - - (!) 134 (!) 23 98 % - -  10/02/18 1430 118/69 - - - (!) 24 - - -  10/02/18 1424 - - - (!) 106 (!) 23 100 % - -  10/02/18 1415 - - - - - - 6\' 3"  (1.905 m) 103.8 kg  10/02/18 1414 101/74 (!) 97.5 F (36.4 C) Oral (!) 152 (!) 22 98 % - -     Physical Exam 1425: Physical examination:  Nursing notes reviewed; Vital signs and O2 SAT reviewed;  Constitutional: Well developed, Well nourished, In no acute  distress; Head:  Normocephalic, atraumatic; Eyes: EOMI, PERRL, No scleral icterus; ENMT: Mouth and pharynx normal, Mucous membranes dry; Neck: Supple, Full range of motion, No lymphadenopathy; Cardiovascular: Irregular tachycardic rate and rhythm, No gallop; Respiratory: Breath sounds clear & equal bilaterally, No wheezes.  Speaking full sentences with ease, Normal respiratory effort/excursion; Chest: Nontender, Movement normal; Abdomen: Soft, Nontender, Nondistended, Normal bowel sounds; Genitourinary: No CVA tenderness; Extremities: Peripheral pulses normal, No tenderness, No edema, No calf edema or asymmetry.; Neuro: AA&Ox3, Major CN grossly intact.  Speech clear. No gross focal motor or sensory deficits in extremities.; Skin: Color normal, Warm, Dry.   ED Treatments / Results  Labs (all labs ordered are listed, but only abnormal results are displayed)   EKG EKG Interpretation  Date/Time:  Monday Oct 02 2018 14:15:17 EDT Ventricular Rate:  153 PR Interval:    QRS Duration: 153 QT Interval:  355 QTC Calculation: 567 R Axis:   -10 Text Interpretation:  Poor data quality Atrial fibrillation with rapid ventricular response Baseline wander Artifact When compared with ECG of 09/03/2018 QT has lengthened Atrial fibrillation has replaced Normal sinus rhythm Confirmed by Francine Graven [phone]) on 10/02/2018 3:52:21 PM   Radiology   Procedures Procedures (including critical care time)  Medications Ordered in ED Medications  sodium chloride flush (NS) 0.9 % injection 3 mL (3  mLs Intravenous Not Given 10/02/18 1555)  diltiazem (CARDIZEM) 1 mg/mL load via infusion 10 mg (10 mg Intravenous Bolus from Bag 10/02/18 1433)    And  diltiazem (CARDIZEM) 100 mg in dextrose 5 % 100 mL (1 mg/mL) infusion (12.5 mg/hr Intravenous Rate/Dose Change 10/02/18 1554)  0.9 %  sodium chloride infusion ( Intravenous New Bag/Given 10/02/18 1432)  0.9 %  sodium chloride infusion (has no administration in time range)  sodium chloride 0.9 % bolus 500 mL (0 mLs Intravenous Stopped 10/02/18 1527)  sodium chloride 0.9 % bolus 250 mL (0 mLs Intravenous Stopped 10/02/18 1527)  diltiazem (CARDIZEM) 1 mg/mL load via infusion 15 mg (15 mg Intravenous Bolus from Bag 10/02/18 1554)     Initial Impression / Assessment and Plan / ED Course  I have reviewed the triage vital signs and the nursing notes.  Pertinent labs & imaging results that were available during my care of the patient were reviewed by me and considered in my medical decision making (see chart for details).    MDM Reviewed: previous chart, nursing note and vitals Reviewed previous: labs and ECG Interpretation: labs, ECG and x-ray Total time providing critical care: 30-74 minutes. This excludes time spent performing separately reportable procedures and services. Consults: cardiology and admitting MD    CRITICAL CARE Performed by: Francine Graven Total critical care time: 35 minutes Critical care time was exclusive of separately billable procedures and treating other patients. Critical care was necessary to treat or prevent imminent or life-threatening deterioration. Critical care was time spent personally by me on the following activities: development of treatment plan with patient and/or surrogate as well as nursing, discussions with consultants, evaluation of patient's response to treatment, examination of patient, obtaining history from patient or surrogate, ordering and performing treatments and interventions, ordering and review  of laboratory studies, ordering and review of radiographic studies, pulse oximetry and re-evaluation of patient's condition.   Results for orders placed or performed during the hospital encounter of 87/68/11  Basic metabolic panel  Result Value Ref Range   Sodium 136 135 - 145 mmol/L   Potassium 4.3 3.5 - 5.1 mmol/L   Chloride 102  98 - 111 mmol/L   CO2 23 22 - 32 mmol/L   Glucose, Bld 181 (H) 70 - 99 mg/dL   BUN 33 (H) 8 - 23 mg/dL   Creatinine, Ser 1.51 (H) 0.61 - 1.24 mg/dL   Calcium 10.7 (H) 8.9 - 10.3 mg/dL   GFR calc non Af Amer 43 (L) >60 mL/min   GFR calc Af Amer 50 (L) >60 mL/min   Anion gap 11 5 - 15  Protime-INR- (order if Patient is taking Coumadin / Warfarin)  Result Value Ref Range   Prothrombin Time 20.2 (H) 11.4 - 15.2 seconds   INR 1.8 (H) 0.8 - 1.2  Brain natriuretic peptide  Result Value Ref Range   B Natriuretic Peptide 35.6 0.0 - 100.0 pg/mL  Troponin I - Once  Result Value Ref Range   Troponin I <0.03 <0.03 ng/mL  CBC with Differential  Result Value Ref Range   WBC 16.3 (H) 4.0 - 10.5 K/uL   RBC 4.10 (L) 4.22 - 5.81 MIL/uL   Hemoglobin 10.9 (L) 13.0 - 17.0 g/dL   HCT 35.4 (L) 39.0 - 52.0 %   MCV 86.3 80.0 - 100.0 fL   MCH 26.6 26.0 - 34.0 pg   MCHC 30.8 30.0 - 36.0 g/dL   RDW 15.4 11.5 - 15.5 %   Platelets 349 150 - 400 K/uL   nRBC 0.2 0.0 - 0.2 %   Neutrophils Relative % 78 %   Neutro Abs 13.0 (H) 1.7 - 7.7 K/uL   Lymphocytes Relative 11 %   Lymphs Abs 1.7 0.7 - 4.0 K/uL   Monocytes Relative 7 %   Monocytes Absolute 1.1 (H) 0.1 - 1.0 K/uL   Eosinophils Relative 2 %   Eosinophils Absolute 0.3 0.0 - 0.5 K/uL   Basophils Relative 1 %   Basophils Absolute 0.1 0.0 - 0.1 K/uL   Immature Granulocytes 1 %   Abs Immature Granulocytes 0.08 (H) 0.00 - 0.07 K/uL  Magnesium  Result Value Ref Range   Magnesium 2.1 1.7 - 2.4 mg/dL   Dg Chest Port 1 View Result Date: 10/02/2018 CLINICAL DATA:  Atrial fibrillation with rapid ventricular response and  hypotension. History of squamous cell lung cancer. EXAM: PORTABLE CHEST 1 VIEW COMPARISON:  Radiographs and CT 09/02/2018. FINDINGS: 1408 hours. The heart size and mediastinal contours are grossly stable with persistent right hilar prominence. The known cavitary lesion posteriorly in the right hemithorax is partly obscured by pleuroparenchymal opacity. Pleural calcifications are present bilaterally. Overall appearance is similar to the prior radiographs, and no superimposed airspace disease or pneumothorax identified. The bones appear unchanged. IMPRESSION: No significant change is seen from the prior studies of 1 month ago. No acute superimposed process identified. Electronically Signed   By: Richardean Sale M.D.   On: 10/02/2018 14:46     Roger Blackwell was evaluated in Emergency Department on 10/02/2018 for the symptoms described in the history of present illness. He was evaluated in the context of the global COVID-19 pandemic, which necessitated consideration that the patient might be at risk for infection with the SARS-CoV-2 virus that causes COVID-19. Institutional protocols and algorithms that pertain to the evaluation of patients at risk for COVID-19 are in a state of rapid change based on information released by regulatory bodies including the CDC and federal and state organizations. These policies and algorithms were followed during the patient's care in the ED.     1555:  H/H per baseline. BUN/Cr elevated from baseline. Judicious IVF bolus given,  as well as IV cardizem bolus and gtt for afib/RVR with rates to 150-160's. HR improved to 120's after initial bolus, so pt given 2nd IV bolus of cardizem with HR trending downward to 100's. BP improving with IVF and HR control. Dx and testing d/w pt.  Questions answered.  Verb understanding, agreeable to admit.   T/C returned from Cards service, case discussed, including:  HPI, pertinent PM/SHx, VS/PE, dx testing, ED course and treatment:  Agreeable to  consult tomorrow.   1625:  T/C returned from [facility] Tawanna Solo, case discussed, including:  HPI, pertinent PM/SHx, VS/PE, dx testing, ED course and treatment, as well as d/w Cards:  Agreeable to admit.     Final Clinical Impressions(s) / ED Diagnoses   Final diagnoses:  Atrial fibrillation with rapid ventricular response [facility])    ED Discharge Orders    None       Francine Graven, DO 10/07/18 1624

## 2018-10-02 NOTE — ED Triage Notes (Signed)
Patient has history of Squamous cell lung carcinoma. Patient reported to [facility] today for evaluation for treatment, but was found to be in Afib with RVR with hypotension. Patient Alert and Oriented x4. Patient complaining of weakness.

## 2018-10-02 NOTE — ED Notes (Signed)
ED TO INPATIENT HANDOFF REPORT  Name/Age/Gender Roger Blackwell 83 y.o. male  Code Status    Code Status Orders  (From admission, onward)         Start     Ordered   10/02/18 1645  Full code  Continuous     10/02/18 1644        Code Status History    Date Active Date Inactive Code Status Order ID Comments User Context   09/02/2018 1705 09/04/2018 1758 Full Code [id number]  Caren Griffins, MD ED   05/19/2018 1749 05/25/2018 1702 Full Code [id number]  Georgette Shell, MD ED      Home/SNF/Other Home  Chief Complaint Hypotension, RVR  Level of Care/Admitting Diagnosis ED Disposition    ED Disposition Condition [place]: [facility] [[id number]]  Level of Care: Telemetry [5]  Admit to tele based on following criteria: Complex arrhythmia (Bradycardia/Tachycardia)  Covid Evaluation: N/A  Diagnosis: A-fib [facility]) [[id number]]  Admitting Physician: Shelly Coss [[id number]]  Attending Physician: Shelly Coss [[id number]]  PT Class (Do Not Modify): Observation [104]  PT Acc Code (Do Not Modify): Observation [10022]       Medical History Past Medical History:  Diagnosis Date  . CAD (coronary artery disease)   . CHF (congestive heart failure) ([place])   . DM (diabetes mellitus) ([place])   . Dyslipidemia   . Gout   . Morbid obesity ([place])   . Systemic hypertension     Allergies Allergies  Allergen Reactions  . Codeine Nausea And Vomiting and Other (See Comments)    Made the patient feel badly, also  . Ivp Dye [Iodinated Diagnostic Agents] Other (See Comments)    Blisters all over the body    IV Location/Drains/Wounds Patient Lines/Drains/Airways Status   Active Line/Drains/Airways    Name:   Placement date:   Placement time:   Site:   Days:   Peripheral IV 10/02/18 Left Antecubital   10/02/18    1416    Antecubital   less than 1   Incision (Closed) 08/23/18 Back Right;Mid   08/23/18    1147     40           Labs/Imaging Results for orders placed or performed during the hospital encounter of 10/02/18 (from the past 48 hour(s))  Basic metabolic panel     Status: Abnormal   Collection Time: 10/02/18  2:19 PM  Result Value Ref Range   Sodium 136 135 - 145 mmol/L   Potassium 4.3 3.5 - 5.1 mmol/L   Chloride 102 98 - 111 mmol/L   CO2 23 22 - 32 mmol/L   Glucose, Bld 181 (H) 70 - 99 mg/dL   BUN 33 (H) 8 - 23 mg/dL   Creatinine, Ser 1.51 (H) 0.61 - 1.24 mg/dL   Calcium 10.7 (H) 8.9 - 10.3 mg/dL   GFR calc non Af Amer 43 (L) >60 mL/min   GFR calc Af Amer 50 (L) >60 mL/min   Anion gap 11 5 - 15    Comment: Performed at [facility], [place] [address] [place], [place] [address]  Protime-INR- (order if Patient is taking Coumadin / Warfarin)     Status: Abnormal   Collection Time: 10/02/18  2:19 PM  Result Value Ref Range   Prothrombin Time 20.2 (H) 11.4 - 15.2 seconds   INR 1.8 (H) 0.8 - 1.2    Comment: (NOTE) INR goal varies based on device and disease states.  Performed at [facility], [place] [address], [place], [place] [address]   Brain natriuretic peptide     Status: None   Collection Time: 10/02/18  2:21 PM  Result Value Ref Range   B Natriuretic Peptide 35.6 0.0 - 100.0 pg/mL    Comment: Performed at [facility], [place] [address] [place], [place] [address]  Troponin I - Once     Status: None   Collection Time: 10/02/18  2:21 PM  Result Value Ref Range   Troponin I <0.03 <0.03 ng/mL    Comment: Performed at [facility], [place] [address], [place], [place] [address]  CBC with Differential     Status: Abnormal   Collection Time: 10/02/18  2:21 PM  Result Value Ref Range   WBC 16.3 (H) 4.0 - 10.5 K/uL   RBC 4.10 (L) 4.22 - 5.81 MIL/uL   Hemoglobin 10.9 (L) 13.0 - 17.0 g/dL   HCT 35.4 (L) 39.0 - 52.0 %   MCV 86.3 80.0 - 100.0 fL   MCH 26.6 26.0 - 34.0 pg   MCHC 30.8 30.0 - 36.0 g/dL   RDW 15.4 11.5 - 15.5 %    Platelets 349 150 - 400 K/uL   nRBC 0.2 0.0 - 0.2 %   Neutrophils Relative % 78 %   Neutro Abs 13.0 (H) 1.7 - 7.7 K/uL   Lymphocytes Relative 11 %   Lymphs Abs 1.7 0.7 - 4.0 K/uL   Monocytes Relative 7 %   Monocytes Absolute 1.1 (H) 0.1 - 1.0 K/uL   Eosinophils Relative 2 %   Eosinophils Absolute 0.3 0.0 - 0.5 K/uL   Basophils Relative 1 %   Basophils Absolute 0.1 0.0 - 0.1 K/uL   Immature Granulocytes 1 %   Abs Immature Granulocytes 0.08 (H) 0.00 - 0.07 K/uL    Comment: Performed at [facility], [place] [address] [place], [place] [address]  Magnesium     Status: None   Collection Time: 10/02/18  2:21 PM  Result Value Ref Range   Magnesium 2.1 1.7 - 2.4 mg/dL    Comment: Performed at [facility], [place] [address], [place], [place] [address]   Dg Chest Port 1 View  Result Date: 10/02/2018 CLINICAL DATA:  Atrial fibrillation with rapid ventricular response and hypotension. History of squamous cell lung cancer. EXAM: PORTABLE CHEST 1 VIEW COMPARISON:  Radiographs and CT 09/02/2018. FINDINGS: 1408 hours. The heart size and mediastinal contours are grossly stable with persistent right hilar prominence. The known cavitary lesion posteriorly in the right hemithorax is partly obscured by pleuroparenchymal opacity. Pleural calcifications are present bilaterally. Overall appearance is similar to the prior radiographs, and no superimposed airspace disease or pneumothorax identified. The bones appear unchanged. IMPRESSION: No significant change is seen from the prior studies of 1 month ago. No acute superimposed process identified. Electronically Signed   By: Richardean Sale M.D.   On: 10/02/2018 14:46    Pending Labs Unresulted Labs (From admission, onward)    Start     Ordered   10/03/18 1308  Basic metabolic panel  Tomorrow morning,   R     10/02/18 1644   10/03/18 0500  CBC  Tomorrow morning,   R     10/02/18 1644          Vitals/Pain Today's Vitals    10/02/18 1630 10/02/18 1642 10/02/18 1700 10/02/18 1701  BP: 126/79 126/79 (!) 113/57   Pulse: (!) 103 (!) 147  Marland Kitchen)  49  Resp: (!) 23 20 18  (!) 26  Temp:      TempSrc:      SpO2: 100% 100%  100%  Weight:      Height:        Isolation Precautions No active isolations  Medications Medications  sodium chloride flush (NS) 0.9 % injection 3 mL (3 mLs Intravenous Not Given 10/02/18 1555)  diltiazem (CARDIZEM) 1 mg/mL load via infusion 10 mg (10 mg Intravenous Bolus from Bag 10/02/18 1433)    And  diltiazem (CARDIZEM) 100 mg in dextrose 5 % 100 mL (1 mg/mL) infusion (12.5 mg/hr Intravenous Rate/Dose Verify 10/02/18 1703)  0.9 %  sodium chloride infusion ( Intravenous New Bag/Given 10/02/18 1432)  0.9 %  sodium chloride infusion (has no administration in time range)  allopurinol (ZYLOPRIM) tablet 300 mg (has no administration in time range)  mirtazapine (REMERON) tablet 15 mg (has no administration in time range)  apixaban (ELIQUIS) tablet 5 mg (has no administration in time range)  sodium chloride 0.9 % bolus 500 mL (0 mLs Intravenous Stopped 10/02/18 1527)  sodium chloride 0.9 % bolus 250 mL (0 mLs Intravenous Stopped 10/02/18 1527)  diltiazem (CARDIZEM) 1 mg/mL load via infusion 15 mg (15 mg Intravenous Bolus from Bag 10/02/18 1554)    Mobility non-ambulatory

## 2018-10-02 NOTE — ED Notes (Signed)
Attempted to call report. Was told the floor did not have a nurse to take report at this time. Charge RN to call this RN for report shortly.

## 2018-10-03 DIAGNOSIS — Z1159 Encounter for screening for other viral diseases: Secondary | ICD-10-CM | POA: Diagnosis not present

## 2018-10-03 DIAGNOSIS — D72829 Elevated white blood cell count, unspecified: Secondary | ICD-10-CM | POA: Diagnosis present

## 2018-10-03 DIAGNOSIS — J850 Gangrene and necrosis of lung: Secondary | ICD-10-CM | POA: Diagnosis present

## 2018-10-03 DIAGNOSIS — H409 Unspecified glaucoma: Secondary | ICD-10-CM | POA: Diagnosis present

## 2018-10-03 DIAGNOSIS — C3431 Malignant neoplasm of lower lobe, right bronchus or lung: Secondary | ICD-10-CM | POA: Diagnosis present

## 2018-10-03 DIAGNOSIS — Z833 Family history of diabetes mellitus: Secondary | ICD-10-CM | POA: Diagnosis not present

## 2018-10-03 DIAGNOSIS — I959 Hypotension, unspecified: Secondary | ICD-10-CM | POA: Diagnosis not present

## 2018-10-03 DIAGNOSIS — N179 Acute kidney failure, unspecified: Secondary | ICD-10-CM | POA: Diagnosis present

## 2018-10-03 DIAGNOSIS — N183 Chronic kidney disease, stage 3 (moderate): Secondary | ICD-10-CM | POA: Diagnosis present

## 2018-10-03 DIAGNOSIS — I130 Hypertensive heart and chronic kidney disease with heart failure and stage 1 through stage 4 chronic kidney disease, or unspecified chronic kidney disease: Secondary | ICD-10-CM | POA: Diagnosis present

## 2018-10-03 DIAGNOSIS — E785 Hyperlipidemia, unspecified: Secondary | ICD-10-CM | POA: Diagnosis present

## 2018-10-03 DIAGNOSIS — I2510 Atherosclerotic heart disease of native coronary artery without angina pectoris: Secondary | ICD-10-CM | POA: Diagnosis present

## 2018-10-03 DIAGNOSIS — Z794 Long term (current) use of insulin: Secondary | ICD-10-CM | POA: Diagnosis not present

## 2018-10-03 DIAGNOSIS — E1122 Type 2 diabetes mellitus with diabetic chronic kidney disease: Secondary | ICD-10-CM | POA: Diagnosis present

## 2018-10-03 DIAGNOSIS — Z79899 Other long term (current) drug therapy: Secondary | ICD-10-CM | POA: Diagnosis not present

## 2018-10-03 DIAGNOSIS — I482 Chronic atrial fibrillation, unspecified: Secondary | ICD-10-CM | POA: Diagnosis present

## 2018-10-03 DIAGNOSIS — Z7901 Long term (current) use of anticoagulants: Secondary | ICD-10-CM | POA: Diagnosis not present

## 2018-10-03 DIAGNOSIS — Z955 Presence of coronary angioplasty implant and graft: Secondary | ICD-10-CM | POA: Diagnosis not present

## 2018-10-03 DIAGNOSIS — R634 Abnormal weight loss: Secondary | ICD-10-CM | POA: Diagnosis present

## 2018-10-03 DIAGNOSIS — Z8249 Family history of ischemic heart disease and other diseases of the circulatory system: Secondary | ICD-10-CM | POA: Diagnosis not present

## 2018-10-03 DIAGNOSIS — M109 Gout, unspecified: Secondary | ICD-10-CM | POA: Diagnosis present

## 2018-10-03 DIAGNOSIS — I4891 Unspecified atrial fibrillation: Secondary | ICD-10-CM | POA: Diagnosis not present

## 2018-10-03 DIAGNOSIS — E860 Dehydration: Secondary | ICD-10-CM | POA: Diagnosis present

## 2018-10-03 DIAGNOSIS — I5032 Chronic diastolic (congestive) heart failure: Secondary | ICD-10-CM | POA: Diagnosis present

## 2018-10-03 LAB — CBC
HCT: 32.7 % — ABNORMAL LOW (ref 39.0–52.0)
Hemoglobin: 9.4 g/dL — ABNORMAL LOW (ref 13.0–17.0)
MCH: 25.7 pg — ABNORMAL LOW (ref 26.0–34.0)
MCHC: 28.7 g/dL — ABNORMAL LOW (ref 30.0–36.0)
MCV: 89.3 fL (ref 80.0–100.0)
Platelets: 269 10*3/uL (ref 150–400)
RBC: 3.66 MIL/uL — ABNORMAL LOW (ref 4.22–5.81)
RDW: 15.3 % (ref 11.5–15.5)
WBC: 10.9 10*3/uL — ABNORMAL HIGH (ref 4.0–10.5)
nRBC: 0 % (ref 0.0–0.2)

## 2018-10-03 LAB — BASIC METABOLIC PANEL
Anion gap: 9 (ref 5–15)
BUN: 29 mg/dL — ABNORMAL HIGH (ref 8–23)
CO2: 23 mmol/L (ref 22–32)
Calcium: 10 mg/dL (ref 8.9–10.3)
Chloride: 107 mmol/L (ref 98–111)
Creatinine, Ser: 1.2 mg/dL (ref 0.61–1.24)
GFR calc Af Amer: >60 mL/min (ref >60)
GFR calc non Af Amer: 57 mL/min — ABNORMAL LOW (ref >60)
Glucose, Bld: 120 mg/dL — ABNORMAL HIGH (ref 70–99)
Potassium: 4.6 mmol/L (ref 3.5–5.1)
Sodium: 139 mmol/L (ref 135–145)

## 2018-10-03 MED ORDER — DILTIAZEM HCL ER COATED BEADS 180 MG PO CP24
180.0000 mg | ORAL_CAPSULE | Freq: Every day | ORAL | Status: DC
  Administered 2018-10-03 – 2018-10-04 (×2): 180 mg via ORAL
  Filled 2018-10-03 (×2): qty 1

## 2018-10-03 NOTE — Progress Notes (Signed)
PROGRESS NOTE    Roger Blackwell  BHA:[id number] DOB: August 02, 1935 DOA: 10/02/2018 PCP: [name], MD    Brief Narrative:   83 year old gentleman with history of recently diagnosed right lower lobe squamous cell carcinoma, prostate cancer in remission, chronic atrial fibrillation, diastolic dysfunction, diabetes, gout and recent episodes of necrotizing pneumonia who presented to the emergency room with complaints of shortness of breath and palpitations.  Patient does have history of chronic atrial fibrillation and takes Cardizem 120 mg at home.  He takes Eliquis for anticoagulation.  Patient was seen at cancer center and found to be hypotensive and tachycardic so he was sent to ER. Heart rate is getting controlled.  He is a still short of breath.  Assessment & Plan:   Principal Problem:   Atrial fibrillation with RVR (HCC) Active Problems:   CAD s/p left circumflex coronary stents 2002   Chronic diastolic heart failure (HCC)   Essential hypertension   Type 2 diabetes mellitus with stage 3 chronic kidney disease, with long-term current use of insulin (HCC)   Dyslipidemia   CKD (chronic kidney disease) stage 3, GFR 30-59 ml/min (HCC)   Gout   A-fib (HCC)  Chronic A. fib with RVR: Patient was treated with Cardizem infusion overnight.  Currently converted to sinus rhythm.  Rate controlled.  Start increasing dose of Cardizem 180 mg today and gradually taper off the Cardizem infusion.  He is therapeutically anticoagulated on Eliquis.  Squamous cell carcinoma of the right lower lobe: As per recent biopsy.  Followed by oncology radiation oncology.  Not restarted on treatment yet.  History of prostate cancer: Status post radiation treatment 1994.  Currently in remission.  Congestive heart failure with preserved ejection fraction: Followed by cardiology.  On as needed Lasix.  Normal ejection fraction.  Currently euvolemic.  History of coronary artery disease: Status post PCI.  Currently  stable.  Type 2 diabetes on insulin: remains on insulin and is stable.  Acute kidney injury with history of chronic kidney disease stage II/3: Treated with IV fluids overnight.  Renal functions improved and normalized.  Hypotension: Due to clinical dehydration.  Blood pressure is adequate and improving today after IV fluid resuscitation.  We will continue to monitor.   Advance activities.  Work with PT OT. Patient continues to be monitored in the hospital to control his heart rate, to monitor advancing his medications and activities.  He needs to stay on telemetry to monitor his cardiovascular function.  Will admit as inpatient.  DVT prophylaxis: Eliquis. Code Status: Full code. Family Communication: None. Disposition Plan: Home with home health care.  Anticipate tomorrow.   Consultants:   None.  Procedures:   None.  Antimicrobials:   None.   Subjective: Patient was seen and examined.  Poor historian.  Denies any complaints.  Denies any chest pain.  Very short of breath on ambulation.  Tolerated Cardizem titrated down to 5 mg/h.  Telemetry review shows normal sinus rhythm.  Objective: Vitals:   10/02/18 1900 10/02/18 1910 10/02/18 1949 10/03/18 0430  BP: (!) 105/59 (!) 105/59 111/60 120/61  Pulse:  84 84 75  Resp: 18 19 16 16   Temp:   97.7 F (36.5 C) (!) 97.5 F (36.4 C)  TempSrc:   Oral Oral  SpO2: 100% 100% 100% 99%  Weight:   94.8 kg   Height:   6\' 3"  (1.905 m)     Intake/Output Summary (Last 24 hours) at 10/03/2018 1417 Last data filed at 10/03/2018 0845 Gross per 24 hour  Intake 1478.92 ml  Output 300 ml  Net 1178.92 ml   Filed Weights   10/02/18 1415 10/02/18 1949  Weight: 103.8 kg 94.8 kg    Examination:  General exam: Appears calm and comfortable, currently comfortable.  On 2 L oxygen.  Chronically sick looking but not in any distress. Respiratory system: Clear to auscultation. Respiratory effort normal. Cardiovascular system: S1 & S2 heard,  RRR. No JVD, murmurs, rubs, gallops or clicks. No pedal edema. Gastrointestinal system: Abdomen is nondistended, soft and nontender. No organomegaly or masses felt. Normal bowel sounds heard. Central nervous system: Alert and oriented. No focal neurological deficits. Extremities: Symmetric 5 x 5 power. Skin: No rashes, lesions or ulcers Psychiatry: Judgement and insight appear normal. Mood & affect appropriate.     Data Reviewed: I have personally reviewed following labs and imaging studies  CBC: Recent Labs  Lab 10/02/18 1421 10/03/18 0707  WBC 16.3* 10.9*  NEUTROABS 13.0*  --   HGB 10.9* 9.4*  HCT 35.4* 32.7*  MCV 86.3 89.3  PLT 349 465   Basic Metabolic Panel: Recent Labs  Lab 10/02/18 1419 10/02/18 1421 10/03/18 0707  NA 136  --  139  K 4.3  --  4.6  CL 102  --  107  CO2 23  --  23  GLUCOSE 181*  --  120*  BUN 33*  --  29*  CREATININE 1.51*  --  1.20  CALCIUM 10.7*  --  10.0  MG  --  2.1  --    GFR: Estimated Creatinine Clearance: 58.7 mL/min (by C-G formula based on SCr of 1.2 mg/dL). Liver Function Tests: No results for input(s): AST, ALT, ALKPHOS, BILITOT, PROT, ALBUMIN in the last 168 hours. No results for input(s): LIPASE, AMYLASE in the last 168 hours. No results for input(s): AMMONIA in the last 168 hours. Coagulation Profile: Recent Labs  Lab 10/02/18 1419  INR 1.8*   Cardiac Enzymes: Recent Labs  Lab 10/02/18 1421  TROPONINI <0.03   BNP (last 3 results) No results for input(s): PROBNP in the last 8760 hours. HbA1C: No results for input(s): HGBA1C in the last 72 hours. CBG: No results for input(s): GLUCAP in the last 168 hours. Lipid Profile: No results for input(s): CHOL, HDL, LDLCALC, TRIG, CHOLHDL, LDLDIRECT in the last 72 hours. Thyroid Function Tests: No results for input(s): TSH, T4TOTAL, FREET4, T3FREE, THYROIDAB in the last 72 hours. Anemia Panel: No results for input(s): VITAMINB12, FOLATE, FERRITIN, TIBC, IRON, RETICCTPCT in  the last 72 hours. Sepsis Labs: No results for input(s): PROCALCITON, LATICACIDVEN in the last 168 hours.  Recent Results (from the past 240 hour(s))  SARS Coronavirus 2 (CEPHEID - Performed in [facility] hospital lab), Hosp Order     Status: None   Collection Time: 10/02/18  6:09 PM  Result Value Ref Range Status   SARS Coronavirus 2 NEGATIVE NEGATIVE Final    Comment: (NOTE) If result is NEGATIVE SARS-CoV-2 target nucleic acids are NOT DETECTED. The SARS-CoV-2 RNA is generally detectable in upper and lower  respiratory specimens during the acute phase of infection. The lowest  concentration of SARS-CoV-2 viral copies this assay can detect is 250  copies / mL. A negative result does not preclude SARS-CoV-2 infection  and should not be used as the sole basis for treatment or other  patient management decisions.  A negative result may occur with  improper specimen collection / handling, submission of specimen other  than nasopharyngeal swab, presence of viral mutation(s) within the  areas targeted by this assay, and inadequate number of viral copies  (<250 copies / mL). A negative result must be combined with clinical  observations, patient history, and epidemiological information. If result is POSITIVE SARS-CoV-2 target nucleic acids are DETECTED. The SARS-CoV-2 RNA is generally detectable in upper and lower  respiratory specimens dur ing the acute phase of infection.  Positive  results are indicative of active infection with SARS-CoV-2.  Clinical  correlation with patient history and other diagnostic information is  necessary to determine patient infection status.  Positive results do  not rule out bacterial infection or co-infection with other viruses. If result is PRESUMPTIVE POSTIVE SARS-CoV-2 nucleic acids MAY BE PRESENT.   A presumptive positive result was obtained on the submitted specimen  and confirmed on repeat testing.  While 2019 novel coronavirus  (SARS-CoV-2)  nucleic acids may be present in the submitted sample  additional confirmatory testing may be necessary for epidemiological  and / or clinical management purposes  to differentiate between  SARS-CoV-2 and other Sarbecovirus currently known to infect humans.  If clinically indicated additional testing with an alternate test  methodology [phone]) is advised. The SARS-CoV-2 RNA is generally  detectable in upper and lower respiratory sp ecimens during the acute  phase of infection. The expected result is Negative. Fact Sheet for Patients:  StrictlyIdeas.no Fact Sheet for Healthcare Providers: BankingDealers.co.za This test is not yet approved or cleared by the [place] FDA and has been authorized for detection and/or diagnosis of SARS-CoV-2 by FDA under an Emergency Use Authorization (EUA).  This EUA will remain in effect (meaning this test can be used) for the duration of the COVID-19 declaration under Section 564(b)(1) of the Act, 21 U.S.C. section 360bbb-3(b)(1), unless the authorization is terminated or revoked sooner. Performed at [facility], [place] [address], [place], [place] [address]          Radiology Studies: Dg Chest Port 1 View  Result Date: 10/02/2018 CLINICAL DATA:  Atrial fibrillation with rapid ventricular response and hypotension. History of squamous cell lung cancer. EXAM: PORTABLE CHEST 1 VIEW COMPARISON:  Radiographs and CT 09/02/2018. FINDINGS: 1408 hours. The heart size and mediastinal contours are grossly stable with persistent right hilar prominence. The known cavitary lesion posteriorly in the right hemithorax is partly obscured by pleuroparenchymal opacity. Pleural calcifications are present bilaterally. Overall appearance is similar to the prior radiographs, and no superimposed airspace disease or pneumothorax identified. The bones appear unchanged. IMPRESSION: No significant change is seen  from the prior studies of 1 month ago. No acute superimposed process identified. Electronically Signed   By: Richardean Sale M.D.   On: 10/02/2018 14:46        Scheduled Meds: . allopurinol  300 mg Oral Daily  . apixaban  5 mg Oral BID  . diltiazem  180 mg Oral Daily  . mirtazapine  15 mg Oral QHS  . sodium chloride flush  3 mL Intravenous Once   Continuous Infusions: . sodium chloride Stopped (10/02/18 1708)  . sodium chloride 75 mL/hr at 10/03/18 0532  . diltiazem (CARDIZEM) infusion Stopped (10/03/18 1000)     LOS: 0 days    Time spent: 25 minutes    Barb Merino, MD [facility] Pager [phone]  If 7PM-7AM, please contact night-coverage [email or web address] Password [email or web address] 10/03/2018, 2:17 PM

## 2018-10-03 NOTE — TOC Initial Note (Signed)
Transition of Care [facility]) - Initial/Assessment Note    Patient Details  Name: Roger Blackwell MRN: [id number] Date of Birth: 07-Jun-1935  Transition of Care (TOC) CM/SW Contact:    Joaquin Courts, RN Phone Number: 10/03/2018, 12:22 PM  Clinical Narrative:                   Expected Discharge Plan: Home/Self Care Barriers to Discharge: Continued Medical Work up   Patient Goals and CMS Choice        Expected Discharge Plan and Services Expected Discharge Plan: Home/Self Care       Living arrangements for the past 2 months: Single Family Home                                      Prior Living Arrangements/Services Living arrangements for the past 2 months: Single Family Home Lives with:: Spouse Patient language and need for interpreter reviewed:: Yes Do you feel safe going back to the place where you live?: Yes      Need for Family Participation in Patient Care: Yes (Comment) Care giver support system in place?: Yes (comment)   Criminal Activity/Legal Involvement Pertinent to Current Situation/Hospitalization: No - Comment as needed  Activities of Daily Living Home Assistive Devices/Equipment: Walker (specify type)(front wheel) ADL Screening (condition at time of admission) Patient's cognitive ability adequate to safely complete daily activities?: Yes Is the patient deaf or have difficulty hearing?: No Does the patient have difficulty seeing, even when wearing glasses/contacts?: No Does the patient have difficulty concentrating, remembering, or making decisions?: No Patient able to express need for assistance with ADLs?: Yes Does the patient have difficulty dressing or bathing?: No Independently performs ADLs?: Yes (appropriate for developmental age) Does the patient have difficulty walking or climbing stairs?: Yes Weakness of Legs: Both Weakness of Arms/Hands: None  Permission Sought/Granted                  Emotional Assessment Appearance::  Appears stated age Attitude/Demeanor/Rapport: Engaged Affect (typically observed): Accepting Orientation: : Oriented to Self, Oriented to Place, Oriented to  Time, Oriented to Situation Alcohol / Substance Use: Tobacco Use, Alcohol Use(former smoker) Psych Involvement: No (comment)  Admission diagnosis:  Atrial fibrillation with rapid ventricular response ([place]) [I48.91] Patient Active Problem List   Diagnosis Date Noted  . A-fib ([place]) 10/02/2018  . Primary cancer of right lower lobe of lung ([place]) 10/02/2018  . Atrial fibrillation with RVR ([place]) 09/02/2018  . DOE (dyspnea on exertion) 07/17/2018  . Necrotizing pneumonia ([place]) 06/15/2018  . S/P bronchoscopy with biopsy   . Malnutrition of moderate degree 05/21/2018  . Hemoptysis 05/20/2018  . Weight loss   . Cough with hemoptysis 05/19/2018  . Acute diastolic CHF (congestive heart failure) ([place]) 05/19/2018  . Gout 05/19/2018  . Community acquired pneumonia of right lung   . CKD (chronic kidney disease) stage 3, GFR 30-59 ml/min (HCC) 03/19/2018  . Iron deficiency anemia 08/10/2017  . Dyslipidemia 12/30/2014  . CAD s/p left circumflex coronary stents 2002 05/25/2013  . Class 2 severe obesity due to excess calories with serious comorbidity and body mass index (BMI) of 36.0 to 36.9 in adult ([place]) 05/25/2013  . Chronic diastolic heart failure ([place]) 05/25/2013  . Hypercholesterolemia 05/25/2013  . Essential hypertension 05/25/2013  . Glaucoma 05/25/2013  . Type 2 diabetes mellitus with stage 3 chronic kidney disease, with long-term current use of insulin ([place])  05/25/2013   PCP:  [name], MD Pharmacy:   [id number] [place], [place]. [place] [address] Phone: [phone] Fax: [phone]  [facility] [place] [place] [address] [place] [address] Phone: [phone] Fax: [phone]     Social Determinants of  Health (SDOH) Interventions    Readmission Risk Interventions Readmission Risk Prevention Plan 10/03/2018  Transportation Screening Complete  PCP or Specialist Appt within 3-5 Days Not Complete  Not Complete comments not yet ready for d/c  HRI or [place] Not Complete  HRI or Home Care Consult comments no needs at this time  Social Work Consult for [place] Planning/Counseling Not Complete  SW consult not completed comments no needs at this time  Palliative Care Screening Not Applicable  Medication Review (RN Care Manager) Complete  Some recent data might be hidden

## 2018-10-03 NOTE — Evaluation (Addendum)
Physical Therapy Evaluation Patient Details Name: Roger Blackwell MRN: [id number] DOB: Dec 27, 1935 Today's Date: 10/03/2018   History of Present Illness  83 yo male admitted with a fib with rvr. Hx of lung ca, necrotizing Pna, cad, chf, dm, gout, afib, prostate ca  Clinical Impression  On eval, pt required Min guard-Min assist for mobility. He walked ~60 feet with a RW. HR 75 bpm at rest, 104 bpm during ambulation, 79 bpm end of session. Dyspnea 2/4. He fatigues fairly easily. Will continue to follow and progress activity as tolerated.     Follow Up Recommendations Home health PT;Supervision/Assistance - 24 hour    Equipment Recommendations  None recommended by PT    Recommendations for Other Services       Precautions / Restrictions Precautions Precautions: Fall Restrictions Weight Bearing Restrictions: No      Mobility  Bed Mobility Overal bed mobility: Needs Assistance Bed Mobility: Supine to Sit;Sit to Supine     Supine to sit: Min guard Sit to supine: HOB elevated;Min assist   General bed mobility comments: Assist for LEs. Increased time.   Transfers Overall transfer level: Needs assistance Equipment used: Rolling walker (2 wheeled) Transfers: Sit to/from Stand Sit to Stand: Min guard;From elevated surface         General transfer comment: Close guard for safety. VCs safety, hand placement.   Ambulation/Gait Ambulation/Gait assistance: Min guard Gait Distance (Feet): 60 Feet Assistive device: Rolling walker (2 wheeled) Gait Pattern/deviations: Step-through pattern;Trunk flexed     General Gait Details: slow gait speed. pt fatigues fairly easily. HR 104 bpm during ambulation.   Stairs            Wheelchair Mobility    Modified Rankin (Stroke Patients Only)       Balance Overall balance assessment: Needs assistance         Standing balance support: Bilateral upper extremity supported Standing balance-Leahy Scale: Poor                                Pertinent Vitals/Pain Pain Assessment: No/denies pain    Home Living Family/patient expects to be discharged to:: Private residence Living Arrangements: Spouse/significant other Available Help at Discharge: Family;Available 24 hours/day Type of Home: House Home Access: Stairs to enter Entrance Stairs-Rails: Right Entrance Stairs-Number of Steps: 4 Home Layout: One level Home Equipment: Walker - 2 wheels;Cane - single point      Prior Function Level of Independence: Independent with assistive device(s)         Comments: sometimes uses cane for ambulation when SoB, independent with iADLs     Hand Dominance        Extremity/Trunk Assessment   Upper Extremity Assessment Upper Extremity Assessment: Generalized weakness    Lower Extremity Assessment Lower Extremity Assessment: Generalized weakness    Cervical / Trunk Assessment Cervical / Trunk Assessment: Normal  Communication   Communication: No difficulties  Cognition Arousal/Alertness: Awake/alert Behavior During Therapy: WFL for tasks assessed/performed Overall Cognitive Status: Within Functional Limits for tasks assessed                                        General Comments      Exercises     Assessment/Plan    PT Assessment Patient needs continued PT services  PT Problem List Decreased strength;Decreased balance;Decreased mobility;Decreased activity tolerance;Decreased knowledge  of use of DME       PT Treatment Interventions DME instruction;Gait training;Therapeutic activities;Functional mobility training;Balance training;Patient/family education;Therapeutic exercise    PT Goals (Current goals can be found in the Care Plan section)  Acute Rehab PT Goals Patient Stated Goal: home soon PT Goal Formulation: With patient Time For Goal Achievement: 10/17/18 Potential to Achieve Goals: Good    Frequency Min 3X/week   Barriers to discharge         Co-evaluation               AM-PAC PT "6 Clicks" Mobility  Outcome Measure Help needed turning from your back to your side while in a flat bed without using bedrails?: A Little Help needed moving from lying on your back to sitting on the side of a flat bed without using bedrails?: A Little Help needed moving to and from a bed to a chair (including a wheelchair)?: A Little Help needed standing up from a chair using your arms (e.g., wheelchair or bedside chair)?: A Little Help needed to walk in hospital room?: A Little Help needed climbing 3-5 steps with a railing? : A Little 6 Click Score: 18    End of Session Equipment Utilized During Treatment: Gait belt Activity Tolerance: Patient tolerated treatment well Patient left: in bed;with bed alarm set;with call bell/phone within reach   PT Visit Diagnosis: Unsteadiness on feet (R26.81);Muscle weakness (generalized) (M62.81);Difficulty in walking, not elsewhere classified (R26.2)    Time: 3403-7096 PT Time Calculation (min) (ACUTE ONLY): 14 min   Charges:   PT Evaluation $PT Eval Moderate Complexity: [place], PT Acute Rehabilitation Services Pager: [phone] Office: [phone]

## 2018-10-03 NOTE — Progress Notes (Signed)
Inpatient Diabetes Program Recommendations  AACE/ADA: New Consensus Statement on Inpatient Glycemic Control (2015)  Target Ranges:  Prepandial:   less than 140 mg/dL      Peak postprandial:   less than 180 mg/dL (1-2 hours)      Critically ill patients:  140 - 180 mg/dL   Results for Roger Blackwell, Roger Blackwell (MRN [id number]) as of 10/03/2018 09:06  Ref. Range 10/02/2018 14:19 10/03/2018 07:07  Glucose Latest Ref Range: 70 - 99 mg/dL 181 (H) 120 (H)     Admit with: AFib with RVR  History: DM, CHF, Lung cancer  Home DM Meds: Levemir 18 units QHS (NOT taking per MAR)       Humalog 3 units TID with meals (NOT taking per [facility])  Current Orders: None     MD- Please consider adding Novolog Sensitive Correction Scale/ SSI (0-9 units) TID AC + HS      --Will follow patient during hospitalization--  Wyn Quaker RN, MSN, CDE Diabetes Coordinator Inpatient Glycemic Control Team Team Pager: [phone] (8a-5p)

## 2018-10-03 NOTE — Progress Notes (Signed)
Report received from R. Young,RN. No change in assessment.Stacey Drain

## 2018-10-04 MED ORDER — DILTIAZEM HCL ER COATED BEADS 180 MG PO CP24: 180.0000 mg | ORAL_CAPSULE | Freq: Every day | ORAL | 0 refills | Status: AC

## 2018-10-04 NOTE — TOC Transition Note (Signed)
Transition of Care [facility]) - CM/SW Discharge Note   Patient Details  Name: Roger Blackwell MRN: [id number] Date of Birth: 1935-09-19  Transition of Care [facility]) CM/SW Contact:  Dessa Phi, RN Phone Number: 10/04/2018, 9:50 AM   Clinical Narrative:  D/c home w/HHC-Already active w/[employer] rep Ellen-HHRN/PT/OT-aware of d/c today.Patient already has rw, & support @ home. Has own transp arranged by spouse. No further Cm needs.     Final next level of care: [place] Barriers to Discharge: No Barriers Identified   Patient Goals and CMS Choice Patient states their goals for this hospitalization and ongoing recovery are:: go home CMS Medicare.gov Compare Post Acute Care list provided to:: Patient Choice offered to / list presented to : Patient  Discharge Placement                       Discharge Plan and Services                          HH Arranged: RN, PT, OT [facility] Agency: [facility] Date [facility] Agency Contacted: 10/04/18 Time [place]: [phone] Representative spoke with at [place]: [place] ([place]) Interventions     Readmission Risk Interventions Readmission Risk Prevention Plan 10/03/2018  Transportation Screening Complete  PCP or Specialist Appt within 3-5 Days Not Complete  Not Complete comments not yet ready for d/c  HRI or [place] Not Complete  HRI or Home Care Consult comments no needs at this time  Social Work Consult for [place] Planning/Counseling Not Complete  SW consult not completed comments no needs at this time  Palliative Care Screening Not Applicable  Medication Review Press photographer) Complete  Some recent data might be hidden

## 2018-10-04 NOTE — Consult Note (Signed)
   [facility] CM Inpatient Consult   10/04/2018  Roger Blackwell 1935-05-23 [id number]   Patient screened for potential [facility] Care Management services due to unplanned readmission risk score of 23% (high), 30 day unplanned readmission, and 3 hospitalizations within past 6 months.   Per chart review, current patient disposition is for home with home health RN/PT/OT. Referral placed for [facility] CM community RN post hospital follow up to assess patient needs related to chronic disease management. Patient had been engaged with [facility] CM in the past.  Of note, [facility] Care Management services does not replace or interfere with any services that are arranged by inpatient case management or social work.  Netta Cedars, MSN, [place] [facility] Liaison Nurse Mobile Phone [phone]  Toll free office [phone]

## 2018-10-04 NOTE — Evaluation (Signed)
Occupational Therapy Evaluation Patient Details Name: Roger Blackwell MRN: [id number] DOB: [birth year] Today's Date: 10/04/2018    History of Present Illness 83 yo male admitted with a fib with rvr. Hx of lung ca, necrotizing Pna, cad, chf, dm, gout, afib, prostate ca   Clinical Impression   This 83 y/o male presents with the above. PTA pt reports he is independent with ADL and functional mobility with intermittent use of SPC. Pt requiring minA for functional mobility using RW; completing toileting with minA, seated UB and grooming ADL with setup/minguard assist. Pt HR 88bpm start of session, up to 92 bpm after mobility into bathroom, but noted up to 152bpm after exiting bathroom and transition to sitting in recliner; returned to <110 with seated rest, DOE 2/4. Pt reports he will return home with spouse who is able to provide close to 24hr supervision/assist. He will benefit from continued acute OT services and recommend follow up [facility] therapy services to maximize his safety and independence with ADL and mobility. Will follow.     Follow Up Recommendations  Home health OT;Supervision/Assistance - 24 hour    Equipment Recommendations  None recommended by OT    Recommendations for Other Services       Precautions / Restrictions Precautions Precautions: Fall Restrictions Weight Bearing Restrictions: No      Mobility Bed Mobility Overal bed mobility: Needs Assistance Bed Mobility: Supine to Sit     Supine to sit: Min guard     General bed mobility comments: increased time/effort, no physical assist required  Transfers Overall transfer level: Needs assistance Equipment used: Rolling walker (2 wheeled) Transfers: Sit to/from Stand Sit to Stand: Min guard;From elevated surface;Min assist         General transfer comment: pt able to stand from EOB with close guard for safety, light boosting assist to rise from lower surface height of toilet     Balance Overall balance  assessment: Needs assistance         Standing balance support: Bilateral upper extremity supported Standing balance-Leahy Scale: Poor Standing balance comment: reliant on UE support                           ADL either performed or assessed with clinical judgement   ADL Overall ADL's : Needs assistance/impaired Eating/Feeding: Modified independent;Sitting   Grooming: Set up;Min guard;Sitting   Upper Body Bathing: Min guard;Set up;Sitting   Lower Body Bathing: Minimal assistance;Sit to/from stand   Upper Body Dressing : Set up;Min guard;Sitting Upper Body Dressing Details (indicate cue type and reason): doffing/donning new gown Lower Body Dressing: Minimal assistance;Moderate assistance;Sit to/from stand   Toilet Transfer: Minimal assistance;Ambulation;Regular Toilet;Grab bars;RW   Toileting- Clothing Manipulation and Hygiene: Minimal assistance;Sit to/from stand Toileting - Clothing Manipulation Details (indicate cue type and reason): assist for gown management, pt performing peri-care after attempts to have BM     Functional mobility during ADLs: Minimal assistance;Rolling walker General ADL Comments: pt slightly impulsive, SOB with activity (DOE 2/4)      Vision         Perception     Praxis      Pertinent Vitals/Pain Pain Assessment: No/denies pain     Hand Dominance     Extremity/Trunk Assessment Upper Extremity Assessment Upper Extremity Assessment: Generalized weakness   Lower Extremity Assessment Lower Extremity Assessment: Defer to PT evaluation   Cervical / Trunk Assessment Cervical / Trunk Assessment: Normal   Communication Communication Communication: No difficulties  Cognition Arousal/Alertness: Awake/alert Behavior During Therapy: WFL for tasks assessed/performed Overall Cognitive Status: Within Functional Limits for tasks assessed                                 General Comments: pt slightly impulsive with  mobility and requires min safety cues, suspect this is likely his baseline   General Comments  HR 88 start of session up to 92 with mobility into bathroom, once exiting bathroom pt HR up to 150s, once returned to sitting returned to high 90s/low 100 range    Exercises     Shoulder Instructions      Home Living Family/patient expects to be discharged to:: Private residence Living Arrangements: Spouse/significant other Available Help at Discharge: Family;Available 24 hours/day Type of Home: House Home Access: Stairs to enter [employer] of Steps: 4 Entrance Stairs-Rails: Right Home Layout: One level     Bathroom Shower/Tub: Occupational psychologist: Standard     Home Equipment: Environmental consultant - 2 wheels;Cane - single point          Prior Functioning/Environment Level of Independence: Independent with assistive device(s)        Comments: sometimes uses cane for ambulation when SoB, independent with iADLs        OT Problem List: Decreased strength;Decreased range of motion;Decreased activity tolerance;Impaired balance (sitting and/or standing);Decreased cognition;Decreased safety awareness;Decreased knowledge of use of DME or AE      OT Treatment/Interventions: Self-care/ADL training;Therapeutic exercise;DME and/or AE instruction;Therapeutic activities;Patient/family education;Balance training    OT Goals(Current goals can be found in the care plan section) Acute Rehab OT Goals Patient Stated Goal: home soon OT Goal Formulation: With patient Time For Goal Achievement: 10/18/18 Potential to Achieve Goals: Good  OT Frequency: Min 2X/week   Barriers to D/C:            Co-evaluation              AM-PAC OT "6 Clicks" Daily Activity     Outcome Measure Help from another person eating meals?: None Help from another person taking care of personal grooming?: A Little Help from another person toileting, which includes using toliet, bedpan, or urinal?:  A Little Help from another person bathing (including washing, rinsing, drying)?: A Little Help from another person to put on and taking off regular upper body clothing?: None Help from another person to put on and taking off regular lower body clothing?: A Lot 6 Click Score: 19   End of Session Equipment Utilized During Treatment: Gait belt;Rolling walker Nurse Communication: Mobility status  Activity Tolerance: Patient tolerated treatment well Patient left: in chair;with call bell/phone within reach;with nursing/sitter in room  OT Visit Diagnosis: Unsteadiness on feet (R26.81);Muscle weakness (generalized) (M62.81)                Time: 2500-3704 OT Time Calculation (min): 23 min Charges:  OT General Charges $OT Visit: 1 Visit OT Evaluation $OT Eval Moderate Complexity: 1 Mod OT Treatments $Self Care/Home Management : 8-22 mins  Lou Cal, OT Supplemental Rehabilitation Services Pager [phone] Office [place] 10/04/2018, 9:56 AM

## 2018-10-04 NOTE — Discharge Summary (Signed)
Physician Discharge Summary  Roger Blackwell BZJ:[id number] DOB: 1935-06-14 DOA: 10/02/2018  PCP: [name], MD  Admit date: 10/02/2018 Discharge date: 10/04/2018  Admitted From: Home Disposition:  Home  Recommendations for Outpatient Follow-up:  1. Follow up with PCP in 1 week 2. Follow up with Dr. [facility]  Home Health: PT   Equipment/Devices: None    Discharge Condition: Stable CODE STATUS: Full  Diet recommendation: Heart healthy   Brief/Interim Summary: From H&P by Dr. Tawanna Solo: "Roger Blackwell is a 83 y.o. male with medical history significant of recently diagnosed right lower lobe squamous cell carcinoma, prostate cancer in remission,atrial fibrillation, diastolic CHF,Diabetes ,Gout, necrotizing pneumonia who presents to the emergency department with complaints of shortness of breath and palpitations.  Patient is a  poor historian. Patient is on Cardizem at home.  Takes Eliquis for anticoagulation.He follows with cardiology for his A. fib and CHF.  Patient earlier today presented to the cancer center for the follow-up of his lung cancer.  He was found to be hypotensive and tachycardic.  Thus he  was sent to the emergency department for further evaluation.  He was found to be in A. fib with RVR on presentation.  He was admitted last month and discharged on 09/04/2018 with new onset A. fib with RVR.  He was managed and discharged on 05/25/2022 necrotizing right lower lobe pneumonia. Patient states he is compliant on his medications.  He denies any cough, chest pain, fever, chills, known COVID positive exposures, abdominal  pain or dysuria, nausea, vomiting or diarrhea.  ED Course: Patient was started on Cardizem drip with improvement of the heart rate.  He was seen and examined at the bedside.  Looked comfortable and says he is feeling better. ED discussed with cardiology."  Interim: Patient was managed with IV Cardizem.  Patient converted to normal sinus rhythm.  Patient's home  oral Cardizem dose was increased.  His Eliquis was continued.  On day of discharge, he was feeling well, eating breakfast without any issue.  He denied any chest pain, shortness of breath, nausea or vomiting.   Discharge Diagnoses:  Principal Problem:   Atrial fibrillation with RVR ([place]) Active Problems:   CAD s/p left circumflex coronary stents 2002   Chronic diastolic heart failure (HCC)   Essential hypertension   Type 2 diabetes mellitus with stage 3 chronic kidney disease, with long-term current use of insulin (HCC)   Dyslipidemia   CKD (chronic kidney disease) stage 3, GFR 30-59 ml/min (HCC)   Gout   A-fib (HCC)   Chronic A. fib with RVR: Patient was treated with Cardizem infusion.    Now converted to sinus rhythm.  Rate controlled.  Start increasing dose of Cardizem 180 mg.  He is therapeutically anticoagulated on Eliquis.  Squamous cell carcinoma of the right lower lobe: As per recent biopsy.  Followed by oncology radiation oncology.  Not restarted on treatment yet.  History of prostate cancer: Status post radiation treatment 1994.  Currently in remission.  Chronic congestive heart failure with preserved ejection fraction: Followed by cardiology.  Normal ejection fraction.  Currently euvolemic.  History of coronary artery disease: Status post PCI.  Currently stable.  Type 2 diabetes on insulin:  Blood sugar well controlled.   Acute kidney injury with history of chronic kidney disease stage II/3: Treated with IV fluids.  Renal functions improved and normalized.  Hypotension: Due to clinical dehydration.    Improved   Discharge Instructions  Discharge Instructions    (HEART FAILURE PATIENTS) Call  MD:  Anytime you have any of the following symptoms: 1) 3 pound weight gain in 24 hours or 5 pounds in 1 week 2) shortness of breath, with or without a dry hacking cough 3) swelling in the hands, feet or stomach 4) if you have to sleep on extra pillows at night in order to  breathe.   Complete by:  As directed    Call MD for:  difficulty breathing, headache or visual disturbances   Complete by:  As directed    Call MD for:  extreme fatigue   Complete by:  As directed    Call MD for:  hives   Complete by:  As directed    Call MD for:  persistant dizziness or light-headedness   Complete by:  As directed    Call MD for:  persistant nausea and vomiting   Complete by:  As directed    Call MD for:  severe uncontrolled pain   Complete by:  As directed    Call MD for:  temperature >100.4   Complete by:  As directed    Diet - low sodium heart healthy   Complete by:  As directed    Discharge instructions   Complete by:  As directed    You were cared for by a hospitalist during your hospital stay. If you have any questions about your discharge medications or the care you received while you were in the hospital after you are discharged, you can call the unit and ask to speak with the hospitalist on call if the hospitalist that took care of you is not available. Once you are discharged, your primary care physician will handle any further medical issues. Please note that NO REFILLS for any discharge medications will be authorized once you are discharged, as it is imperative that you return to your primary care physician (or establish a relationship with a primary care physician if you do not have one) for your aftercare needs so that they can reassess your need for medications and monitor your lab values.   Increase activity slowly   Complete by:  As directed      Allergies as of 10/04/2018      Reactions   Codeine Nausea And Vomiting, Other (See Comments)   Made the patient feel badly, also   Ivp Dye [iodinated Diagnostic Agents] Other (See Comments)   Blisters all over the body      Medication List    STOP taking these medications   furosemide 40 MG tablet Commonly known as:  LASIX   insulin lispro 100 UNIT/ML KwikPen Commonly known as:  HumaLOG KwikPen    Levemir FlexTouch 100 UNIT/ML Pen Generic drug:  Insulin Detemir     TAKE these medications   albuterol 108 (90 Base) MCG/ACT inhaler Commonly known as:  VENTOLIN HFA Inhale 2 puffs into the lungs every 6 (six) hours as needed for wheezing or shortness of breath.   allopurinol 100 MG tablet Commonly known as:  ZYLOPRIM Take 300 mg by mouth daily.   apixaban 5 MG Tabs tablet Commonly known as:  ELIQUIS Take 1 tablet (5 mg total) by mouth 2 (two) times daily.   diltiazem 180 MG 24 hr capsule Commonly known as:  CARDIZEM CD Take 1 capsule (180 mg total) by mouth daily for 30 days. What changed:    medication strength  how much to take   ipratropium-albuterol 0.5-2.5 (3) MG/3ML Soln Commonly known as:  DUONEB Take 3 mLs by nebulization every  6 (six) hours as needed.   iron polysaccharides 150 MG capsule Commonly known as:  NIFEREX Take 150 mg by mouth every evening.   mirtazapine 15 MG tablet Commonly known as:  Remeron Take 1 tablet (15 mg total) by mouth at bedtime.   Potassium 99 MG Tabs Take 99 mg by mouth every other day.   Travoprost (BAK Free) 0.004 % Soln ophthalmic solution Commonly known as:  TRAVATAN Place 1 drop into both eyes at bedtime.      Follow-up Information    [name], MD. Schedule an appointment as soon as possible for a visit in 1 week(s).   Specialty:  Internal Medicine Contact information: [place] [address] [phone]          Allergies  Allergen Reactions  . Codeine Nausea And Vomiting and Other (See Comments)    Made the patient feel badly, also  . Ivp Dye [Iodinated Diagnostic Agents] Other (See Comments)    Blisters all over the body      Procedures/Studies: Dg Chest Port 1 View  Result Date: 10/02/2018 CLINICAL DATA:  Atrial fibrillation with rapid ventricular response and hypotension. History of squamous cell lung cancer. EXAM: PORTABLE CHEST 1 VIEW COMPARISON:  Radiographs  and CT 09/02/2018. FINDINGS: 1408 hours. The heart size and mediastinal contours are grossly stable with persistent right hilar prominence. The known cavitary lesion posteriorly in the right hemithorax is partly obscured by pleuroparenchymal opacity. Pleural calcifications are present bilaterally. Overall appearance is similar to the prior radiographs, and no superimposed airspace disease or pneumothorax identified. The bones appear unchanged. IMPRESSION: No significant change is seen from the prior studies of 1 month ago. No acute superimposed process identified. Electronically Signed   By: Richardean Sale M.D.   On: 10/02/2018 14:46      Discharge Exam: Vitals:   10/03/18 2145 10/04/18 0554  BP: (!) 119/57 130/62  Pulse: 66 69  Resp: 16 16  Temp: 99 F (37.2 C) 98.5 F (36.9 C)  SpO2: 99% 100%    General: Pt is alert, awake, not in acute distress Cardiovascular: RRR, S1/S2 +, no rubs, no gallops Respiratory: CTA bilaterally, no wheezing, no rhonchi Abdominal: Soft, NT, ND, bowel sounds + Extremities: no edema, no cyanosis    The results of significant diagnostics from this hospitalization (including imaging, microbiology, ancillary and laboratory) are listed below for reference.     Microbiology: Recent Results (from the past 240 hour(s))  SARS Coronavirus 2 (CEPHEID - Performed in [place] hospital lab), Hosp Order     Status: None   Collection Time: 10/02/18  6:09 PM  Result Value Ref Range Status   SARS Coronavirus 2 NEGATIVE NEGATIVE Final    Comment: (NOTE) If result is NEGATIVE SARS-CoV-2 target nucleic acids are NOT DETECTED. The SARS-CoV-2 RNA is generally detectable in upper and lower  respiratory specimens during the acute phase of infection. The lowest  concentration of SARS-CoV-2 viral copies this assay can detect is 250  copies / mL. A negative result does not preclude SARS-CoV-2 infection  and should not be used as the sole basis for treatment or other   patient management decisions.  A negative result may occur with  improper specimen collection / handling, submission of specimen other  than nasopharyngeal swab, presence of viral mutation(s) within the  areas targeted by this assay, and inadequate number of viral copies  (<250 copies / mL). A negative result must be combined with clinical  observations, patient  history, and epidemiological information. If result is POSITIVE SARS-CoV-2 target nucleic acids are DETECTED. The SARS-CoV-2 RNA is generally detectable in upper and lower  respiratory specimens dur ing the acute phase of infection.  Positive  results are indicative of active infection with SARS-CoV-2.  Clinical  correlation with patient history and other diagnostic information is  necessary to determine patient infection status.  Positive results do  not rule out bacterial infection or co-infection with other viruses. If result is PRESUMPTIVE POSTIVE SARS-CoV-2 nucleic acids MAY BE PRESENT.   A presumptive positive result was obtained on the submitted specimen  and confirmed on repeat testing.  While 2019 novel coronavirus  (SARS-CoV-2) nucleic acids may be present in the submitted sample  additional confirmatory testing may be necessary for epidemiological  and / or clinical management purposes  to differentiate between  SARS-CoV-2 and other Sarbecovirus currently known to infect humans.  If clinically indicated additional testing with an alternate test  methodology [phone]) is advised. The SARS-CoV-2 RNA is generally  detectable in upper and lower respiratory sp ecimens during the acute  phase of infection. The expected result is Negative. Fact Sheet for Patients:  StrictlyIdeas.no Fact Sheet for Healthcare Providers: BankingDealers.co.za This test is not yet approved or cleared by the [place] FDA and has been authorized for detection and/or diagnosis of SARS-CoV-2  by FDA under an Emergency Use Authorization (EUA).  This EUA will remain in effect (meaning this test can be used) for the duration of the COVID-19 declaration under Section 564(b)(1) of the Act, 21 U.S.C. section 360bbb-3(b)(1), unless the authorization is terminated or revoked sooner. Performed at [facility], [place] [address], [place], [place] [address]      Labs: BNP (last 3 results) Recent Labs    10/02/18 1421  BNP 56.3   Basic Metabolic Panel: Recent Labs  Lab 10/02/18 1419 10/02/18 1421 10/03/18 0707  NA 136  --  139  K 4.3  --  4.6  CL 102  --  107  CO2 23  --  23  GLUCOSE 181*  --  120*  BUN 33*  --  29*  CREATININE 1.51*  --  1.20  CALCIUM 10.7*  --  10.0  MG  --  2.1  --    Liver Function Tests: No results for input(s): AST, ALT, ALKPHOS, BILITOT, PROT, ALBUMIN in the last 168 hours. No results for input(s): LIPASE, AMYLASE in the last 168 hours. No results for input(s): AMMONIA in the last 168 hours. CBC: Recent Labs  Lab 10/02/18 1421 10/03/18 0707  WBC 16.3* 10.9*  NEUTROABS 13.0*  --   HGB 10.9* 9.4*  HCT 35.4* 32.7*  MCV 86.3 89.3  PLT 349 269   Cardiac Enzymes: Recent Labs  Lab 10/02/18 1421  TROPONINI <0.03   BNP: Invalid input(s): POCBNP CBG: No results for input(s): GLUCAP in the last 168 hours. D-Dimer No results for input(s): DDIMER in the last 72 hours. Hgb A1c No results for input(s): HGBA1C in the last 72 hours. Lipid Profile No results for input(s): CHOL, HDL, LDLCALC, TRIG, CHOLHDL, LDLDIRECT in the last 72 hours. Thyroid function studies No results for input(s): TSH, T4TOTAL, T3FREE, THYROIDAB in the last 72 hours.  Invalid input(s): FREET3 Anemia work up No results for input(s): VITAMINB12, FOLATE, FERRITIN, TIBC, IRON, RETICCTPCT in the last 72 hours. Urinalysis    Component Value Date/Time   COLORURINE YELLOW 05/19/2018 1739   APPEARANCEUR HAZY (A) 05/19/2018 1739   LABSPEC 1.016 05/19/2018  [place] 5.0 05/19/2018 1739   GLUCOSEU NEGATIVE 05/19/2018 1739   HGBUR SMALL (A) 05/19/2018 1739   BILIRUBINUR NEGATIVE 05/19/2018 1739   KETONESUR NEGATIVE 05/19/2018 1739   PROTEINUR NEGATIVE 05/19/2018 1739   UROBILINOGEN 0.2 07/14/2007 1105   NITRITE NEGATIVE 05/19/2018 1739   LEUKOCYTESUR MODERATE (A) 05/19/2018 1739   Sepsis Labs Invalid input(s): PROCALCITONIN,  WBC,  LACTICIDVEN Microbiology Recent Results (from the past 240 hour(s))  SARS Coronavirus 2 (CEPHEID - Performed in [place] hospital lab), Hosp Order     Status: None   Collection Time: 10/02/18  6:09 PM  Result Value Ref Range Status   SARS Coronavirus 2 NEGATIVE NEGATIVE Final    Comment: (NOTE) If result is NEGATIVE SARS-CoV-2 target nucleic acids are NOT DETECTED. The SARS-CoV-2 RNA is generally detectable in upper and lower  respiratory specimens during the acute phase of infection. The lowest  concentration of SARS-CoV-2 viral copies this assay can detect is 250  copies / mL. A negative result does not preclude SARS-CoV-2 infection  and should not be used as the sole basis for treatment or other  patient management decisions.  A negative result may occur with  improper specimen collection / handling, submission of specimen other  than nasopharyngeal swab, presence of viral mutation(s) within the  areas targeted by this assay, and inadequate number of viral copies  (<250 copies / mL). A negative result must be combined with clinical  observations, patient history, and epidemiological information. If result is POSITIVE SARS-CoV-2 target nucleic acids are DETECTED. The SARS-CoV-2 RNA is generally detectable in upper and lower  respiratory specimens dur ing the acute phase of infection.  Positive  results are indicative of active infection with SARS-CoV-2.  Clinical  correlation with patient history and other diagnostic information is  necessary to determine patient infection status.  Positive  results do  not rule out bacterial infection or co-infection with other viruses. If result is PRESUMPTIVE POSTIVE SARS-CoV-2 nucleic acids MAY BE PRESENT.   A presumptive positive result was obtained on the submitted specimen  and confirmed on repeat testing.  While 2019 novel coronavirus  (SARS-CoV-2) nucleic acids may be present in the submitted sample  additional confirmatory testing may be necessary for epidemiological  and / or clinical management purposes  to differentiate between  SARS-CoV-2 and other Sarbecovirus currently known to infect humans.  If clinically indicated additional testing with an alternate test  methodology [phone]) is advised. The SARS-CoV-2 RNA is generally  detectable in upper and lower respiratory sp ecimens during the acute  phase of infection. The expected result is Negative. Fact Sheet for Patients:  StrictlyIdeas.no Fact Sheet for Healthcare Providers: BankingDealers.co.za This test is not yet approved or cleared by the [place] FDA and has been authorized for detection and/or diagnosis of SARS-CoV-2 by FDA under an Emergency Use Authorization (EUA).  This EUA will remain in effect (meaning this test can be used) for the duration of the COVID-19 declaration under Section 564(b)(1) of the Act, 21 U.S.C. section 360bbb-3(b)(1), unless the authorization is terminated or revoked sooner. Performed at [facility], [place] [address], [place], [place] [address]      Patient was seen and examined on the day of discharge and was found to be in stable condition. Time coordinating discharge: 35 minutes including assessment and coordination of care, as well as examination of the patient.   SIGNED:  Dessa Phi, DO [facility] [email or web address] 10/04/2018, 9:08 AM

## 2018-10-06 ENCOUNTER — Other Ambulatory Visit: Payer: Self-pay | Admitting: *Deleted

## 2018-10-06 DIAGNOSIS — I2510 Atherosclerotic heart disease of native coronary artery without angina pectoris: Secondary | ICD-10-CM | POA: Diagnosis not present

## 2018-10-06 DIAGNOSIS — I480 Paroxysmal atrial fibrillation: Secondary | ICD-10-CM | POA: Diagnosis not present

## 2018-10-06 DIAGNOSIS — D509 Iron deficiency anemia, unspecified: Secondary | ICD-10-CM | POA: Diagnosis not present

## 2018-10-06 DIAGNOSIS — C3491 Malignant neoplasm of unspecified part of right bronchus or lung: Secondary | ICD-10-CM | POA: Diagnosis not present

## 2018-10-06 DIAGNOSIS — E440 Moderate protein-calorie malnutrition: Secondary | ICD-10-CM | POA: Diagnosis not present

## 2018-10-06 DIAGNOSIS — I5030 Unspecified diastolic (congestive) heart failure: Secondary | ICD-10-CM | POA: Diagnosis not present

## 2018-10-06 DIAGNOSIS — E1122 Type 2 diabetes mellitus with diabetic chronic kidney disease: Secondary | ICD-10-CM | POA: Diagnosis not present

## 2018-10-06 DIAGNOSIS — I130 Hypertensive heart and chronic kidney disease with heart failure and stage 1 through stage 4 chronic kidney disease, or unspecified chronic kidney disease: Secondary | ICD-10-CM | POA: Diagnosis not present

## 2018-10-06 DIAGNOSIS — H409 Unspecified glaucoma: Secondary | ICD-10-CM | POA: Diagnosis not present

## 2018-10-06 DIAGNOSIS — J850 Gangrene and necrosis of lung: Secondary | ICD-10-CM | POA: Diagnosis not present

## 2018-10-06 DIAGNOSIS — N183 Chronic kidney disease, stage 3 (moderate): Secondary | ICD-10-CM | POA: Diagnosis not present

## 2018-10-06 DIAGNOSIS — M109 Gout, unspecified: Secondary | ICD-10-CM | POA: Diagnosis not present

## 2018-10-06 NOTE — Patient Outreach (Signed)
[facility]) Care Management  10/06/2018  Roger Blackwell [birth year] [id number]   Referral received from hospital liaison as member was recently discharged from hospital with diagnosis of atrial fibrillation.  Primary MD office will complete transition of care assessment.  Per chart, he also has history of CAD, CHF, Diabetes, CKD, and dyslipidemia.  Call placed to member for telephone assessment, identity verified.  This care manager introduced self and stated purpose of call.  [facility] care management services explained.  He report he has another agency he is working with and denies any further needs.  Confirmed he is speaking of home health.  Difference between home health and [facility] explained, benefits of [facility] discussed.  He again denies any needs but is agreeable to reach out if condition changes.  Successful letter sent to member, will not open at this time.  Valente David, [place], MSN [place] [phone]

## 2018-10-10 ENCOUNTER — Other Ambulatory Visit: Payer: Self-pay | Admitting: Radiation Oncology

## 2018-10-10 DIAGNOSIS — I10 Essential (primary) hypertension: Secondary | ICD-10-CM | POA: Diagnosis not present

## 2018-10-10 DIAGNOSIS — E1121 Type 2 diabetes mellitus with diabetic nephropathy: Secondary | ICD-10-CM | POA: Diagnosis not present

## 2018-10-10 DIAGNOSIS — C3490 Malignant neoplasm of unspecified part of unspecified bronchus or lung: Secondary | ICD-10-CM

## 2018-10-10 DIAGNOSIS — R410 Disorientation, unspecified: Secondary | ICD-10-CM | POA: Diagnosis not present

## 2018-10-10 DIAGNOSIS — I5032 Chronic diastolic (congestive) heart failure: Secondary | ICD-10-CM | POA: Diagnosis not present

## 2018-10-10 DIAGNOSIS — Z09 Encounter for follow-up examination after completed treatment for conditions other than malignant neoplasm: Secondary | ICD-10-CM | POA: Diagnosis not present

## 2018-10-10 DIAGNOSIS — R0602 Shortness of breath: Secondary | ICD-10-CM | POA: Diagnosis not present

## 2018-10-10 DIAGNOSIS — I4891 Unspecified atrial fibrillation: Secondary | ICD-10-CM | POA: Diagnosis not present

## 2018-10-10 DIAGNOSIS — J869 Pyothorax without fistula: Secondary | ICD-10-CM | POA: Diagnosis not present

## 2018-10-11 DIAGNOSIS — I5030 Unspecified diastolic (congestive) heart failure: Secondary | ICD-10-CM | POA: Diagnosis not present

## 2018-10-11 DIAGNOSIS — J850 Gangrene and necrosis of lung: Secondary | ICD-10-CM | POA: Diagnosis not present

## 2018-10-11 DIAGNOSIS — I130 Hypertensive heart and chronic kidney disease with heart failure and stage 1 through stage 4 chronic kidney disease, or unspecified chronic kidney disease: Secondary | ICD-10-CM | POA: Diagnosis not present

## 2018-10-11 DIAGNOSIS — I480 Paroxysmal atrial fibrillation: Secondary | ICD-10-CM | POA: Diagnosis not present

## 2018-10-11 DIAGNOSIS — D509 Iron deficiency anemia, unspecified: Secondary | ICD-10-CM | POA: Diagnosis not present

## 2018-10-11 DIAGNOSIS — C3491 Malignant neoplasm of unspecified part of right bronchus or lung: Secondary | ICD-10-CM | POA: Diagnosis not present

## 2018-10-11 DIAGNOSIS — I2510 Atherosclerotic heart disease of native coronary artery without angina pectoris: Secondary | ICD-10-CM | POA: Diagnosis not present

## 2018-10-11 DIAGNOSIS — E1122 Type 2 diabetes mellitus with diabetic chronic kidney disease: Secondary | ICD-10-CM | POA: Diagnosis not present

## 2018-10-11 DIAGNOSIS — E440 Moderate protein-calorie malnutrition: Secondary | ICD-10-CM | POA: Diagnosis not present

## 2018-10-11 DIAGNOSIS — M109 Gout, unspecified: Secondary | ICD-10-CM | POA: Diagnosis not present

## 2018-10-11 DIAGNOSIS — N183 Chronic kidney disease, stage 3 (moderate): Secondary | ICD-10-CM | POA: Diagnosis not present

## 2018-10-11 DIAGNOSIS — H409 Unspecified glaucoma: Secondary | ICD-10-CM | POA: Diagnosis not present

## 2018-10-12 DIAGNOSIS — I130 Hypertensive heart and chronic kidney disease with heart failure and stage 1 through stage 4 chronic kidney disease, or unspecified chronic kidney disease: Secondary | ICD-10-CM | POA: Diagnosis not present

## 2018-10-12 DIAGNOSIS — N183 Chronic kidney disease, stage 3 (moderate): Secondary | ICD-10-CM | POA: Diagnosis not present

## 2018-10-12 DIAGNOSIS — M109 Gout, unspecified: Secondary | ICD-10-CM | POA: Diagnosis not present

## 2018-10-12 DIAGNOSIS — I5030 Unspecified diastolic (congestive) heart failure: Secondary | ICD-10-CM | POA: Diagnosis not present

## 2018-10-12 DIAGNOSIS — I480 Paroxysmal atrial fibrillation: Secondary | ICD-10-CM | POA: Diagnosis not present

## 2018-10-12 DIAGNOSIS — E1122 Type 2 diabetes mellitus with diabetic chronic kidney disease: Secondary | ICD-10-CM | POA: Diagnosis not present

## 2018-10-12 DIAGNOSIS — C3491 Malignant neoplasm of unspecified part of right bronchus or lung: Secondary | ICD-10-CM | POA: Diagnosis not present

## 2018-10-12 DIAGNOSIS — J850 Gangrene and necrosis of lung: Secondary | ICD-10-CM | POA: Diagnosis not present

## 2018-10-12 DIAGNOSIS — I2510 Atherosclerotic heart disease of native coronary artery without angina pectoris: Secondary | ICD-10-CM | POA: Diagnosis not present

## 2018-10-12 DIAGNOSIS — E440 Moderate protein-calorie malnutrition: Secondary | ICD-10-CM | POA: Diagnosis not present

## 2018-10-12 DIAGNOSIS — H409 Unspecified glaucoma: Secondary | ICD-10-CM | POA: Diagnosis not present

## 2018-10-12 DIAGNOSIS — D509 Iron deficiency anemia, unspecified: Secondary | ICD-10-CM | POA: Diagnosis not present

## 2018-10-13 ENCOUNTER — Encounter: Payer: Self-pay | Admitting: *Deleted

## 2018-10-13 DIAGNOSIS — J850 Gangrene and necrosis of lung: Secondary | ICD-10-CM | POA: Diagnosis not present

## 2018-10-13 DIAGNOSIS — I130 Hypertensive heart and chronic kidney disease with heart failure and stage 1 through stage 4 chronic kidney disease, or unspecified chronic kidney disease: Secondary | ICD-10-CM | POA: Diagnosis not present

## 2018-10-13 DIAGNOSIS — I5030 Unspecified diastolic (congestive) heart failure: Secondary | ICD-10-CM | POA: Diagnosis not present

## 2018-10-13 DIAGNOSIS — C3491 Malignant neoplasm of unspecified part of right bronchus or lung: Secondary | ICD-10-CM | POA: Diagnosis not present

## 2018-10-14 DIAGNOSIS — J449 Chronic obstructive pulmonary disease, unspecified: Secondary | ICD-10-CM | POA: Diagnosis not present

## 2018-10-16 ENCOUNTER — Telehealth: Payer: Self-pay | Admitting: Adult Health

## 2018-10-16 DIAGNOSIS — R410 Disorientation, unspecified: Secondary | ICD-10-CM | POA: Diagnosis not present

## 2018-10-16 DIAGNOSIS — N390 Urinary tract infection, site not specified: Secondary | ICD-10-CM | POA: Diagnosis not present

## 2018-10-16 NOTE — Telephone Encounter (Signed)
Mychart pending, no smartphone, consent (verbal), pre reg complete 10/16/18 AF

## 2018-10-18 ENCOUNTER — Telehealth: Payer: Self-pay

## 2018-10-18 ENCOUNTER — Encounter: Payer: Self-pay | Admitting: Adult Health

## 2018-10-18 ENCOUNTER — Telehealth (INDEPENDENT_AMBULATORY_CARE_PROVIDER_SITE_OTHER): Payer: Medicare Other | Admitting: General Practice

## 2018-10-18 VITALS — Ht 75.0 in | Wt 218.0 lb

## 2018-10-18 DIAGNOSIS — I5032 Chronic diastolic (congestive) heart failure: Secondary | ICD-10-CM | POA: Diagnosis not present

## 2018-10-18 DIAGNOSIS — I480 Paroxysmal atrial fibrillation: Secondary | ICD-10-CM

## 2018-10-18 DIAGNOSIS — Z79899 Other long term (current) drug therapy: Secondary | ICD-10-CM

## 2018-10-18 DIAGNOSIS — I2510 Atherosclerotic heart disease of native coronary artery without angina pectoris: Secondary | ICD-10-CM

## 2018-10-18 DIAGNOSIS — I10 Essential (primary) hypertension: Secondary | ICD-10-CM

## 2018-10-18 NOTE — Progress Notes (Signed)
Virtual Visit via Telephone Note   This visit type was conducted due to national recommendations for restrictions regarding the COVID-19 Pandemic (e.g. social distancing) in an effort to limit this patient's exposure and mitigate transmission in our community.  Due to his co-morbid illnesses, this patient is at least at moderate risk for complications without adequate follow up.  This format is felt to be most appropriate for this patient at this time.  The patient did not have access to video technology/had technical difficulties with video requiring transitioning to audio format only (telephone).  All issues noted in this document were discussed and addressed.  No physical exam could be performed with this format.  Please refer to the patient's chart for his  consent to telehealth for [facility].  Evaluation Performed:  Follow-up visit  This visit type was conducted due to national recommendations for restrictions regarding the COVID-19 Pandemic (e.g. social distancing).  This format is felt to be most appropriate for this patient at this time.  All issues noted in this document were discussed and addressed.  No physical exam was performed (except for noted visual exam findings with Video Visits).  Please refer to the patient's chart (MyChart message for video visits and phone note for telephone visits) for the patient's consent to telehealth for [facility] Date:  10/18/2018   ID:  Roger Blackwell, DOB 02/05/1936, MRN [id number]   Patient Location:  [place] [address]   Provider location:   [place] [facility] [place], [place] [address]  PCP:  [name], MD  Cardiologist:  Dr.Croitoru  Electrophysiologist:  None   Chief Complaint:  Follow up  History of Present Illness:    Roger Blackwell is a 83 y.o. male who presents via audio/video conferencing for a telehealth visit today.  Patient verified DOB and address.  PMH of coronary artery disease s/p PCI  to left circumflex in 1740, diastolic CHF, hypertension, and dyslipidemia.  Echocardiogram 08/2018- EF of 55- 81%, no diastolic dysfunction.  EKG 10/03/2018 done at Dr. Pennie Banter office- atrial fibrillation with rapid ventricular rate.  Today [name] wife spoke with [place] on the phone due to his current health.  She stated that he began a antibiotic yesterday for treatment of a UTI.  For the last week he has had episodes of hallucinations.  PCP  obtained UA , is managing UTI, and is providing treatment for his congestion/increased work of breathing.  He denies dizziness, chest pain, palpitations, increased lower extremity edema, and PND.  Wife informed that if she felt Mr.Lyanders condition becomes worse or unsafe she should call EMS. [name] wife expressed understanding and all questions were answered.   The patient does not have symptoms concerning for COVID-19 infection (fever, chills, cough, or new SHORTNESS OF BREATH).    Prior CV studies:   The following studies were reviewed today:  Echocardiogram 08/2018- LVEF of 44-81%, no diastolic dysfunction.  EKG 10/03/2018 Dr. Pennie Banter office- atrial fibrillation with rapid ventricular rate.  Past Medical History:  Diagnosis Date  . CAD (coronary artery disease)   . CHF (congestive heart failure) ([place])   . DM (diabetes mellitus) ([place])   . Dyslipidemia   . Gout   . Morbid obesity ([place])   . Systemic hypertension    Past Surgical History:  Procedure Laterality Date  . BACK SURGERY    . CORONARY ANGIOPLASTY WITH STENT PLACEMENT  12/26/2000   80% prox. circumflex lesion w/successful stent placement.  Marland Kitchen NM MYOVIEW LTD  08/31/2010   Normal  . PERICARDIOCENTESIS    . PROSTATE SURGERY    . US ECHOCARDIOGRAPHY  08/31/2010   EF 50-55%,RV mildly dilated,mild aortic root dilatation  . VIDEO BRONCHOSCOPY Bilateral 05/23/2018   Procedure: VIDEO BRONCHOSCOPY WITHOUT FLUORO;  Surgeon: Marshell Garfinkel, MD;  Location: [facility];  Service: Cardiopulmonary;   Laterality: Bilateral;     Current Meds  Medication Sig  . albuterol (PROVENTIL HFA;VENTOLIN HFA) 108 (90 Base) MCG/ACT inhaler Inhale 2 puffs into the lungs every 6 (six) hours as needed for wheezing or shortness of breath.  . allopurinol (ZYLOPRIM) 100 MG tablet Take 300 mg by mouth daily.   Marland Kitchen apixaban (ELIQUIS) 5 MG TABS tablet Take 1 tablet (5 mg total) by mouth 2 (two) times daily.  Marland Kitchen diltiazem (CARDIZEM CD) 180 MG 24 hr capsule Take 1 capsule (180 mg total) by mouth daily for 30 days. (Patient taking differently: Take 120 mg by mouth daily. )  . ipratropium-albuterol (DUONEB) 0.5-2.5 (3) MG/3ML SOLN Take 3 mLs by nebulization every 6 (six) hours as needed.  . iron polysaccharides (NIFEREX) 150 MG capsule Take 150 mg by mouth every evening.   . mirtazapine (REMERON) 15 MG tablet Take 1 tablet (15 mg total) by mouth at bedtime.  . Potassium 99 MG TABS Take 99 mg by mouth every other day.   . Travoprost, BAK Free, (TRAVATAN) 0.004 % SOLN ophthalmic solution Place 1 drop into both eyes at bedtime.     Allergies:   Codeine; Diltiazem; and Ivp dye [iodinated diagnostic agents]   Social History   Tobacco Use  . Smoking status: Former Smoker    Types: Cigarettes    Last attempt to quit: 05/16/1993    Years since quitting: 25.4  . Smokeless tobacco: Never Used  Substance Use Topics  . Alcohol use: Yes    Comment: seldom  . Drug use: No     Family Hx: The patient's family history includes Diabetes in his brother; Hypertension in his sister.  ROS:   Please see the history of present illness.     All other systems reviewed and are negative.   Labs/Other Tests and Data Reviewed:    Recent Labs: 09/02/2018: ALT 13; TSH 2.007 10/02/2018: B Natriuretic Peptide 35.6; Magnesium 2.1 10/03/2018: BUN 29; Creatinine, Ser 1.20; Hemoglobin 9.4; Platelets 269; Potassium 4.6; Sodium 139   Recent Lipid Panel No results found for: CHOL, TRIG, HDL, CHOLHDL, LDLCALC, LDLDIRECT  Wt Readings  from Last 3 Encounters:  10/18/18 218 lb (98.9 kg)  10/02/18 208 lb 15.9 oz (94.8 kg)  09/04/18 228 lb 13.4 oz (103.8 kg)     Exam:    Vital Signs:  Ht 6\' 3"  (1.905 m)   Wt 218 lb (98.9 kg)   BMI 27.25 kg/m    Well nourished, well developed male in no  acute distress.   ASSESSMENT & PLAN:    1.  Coronary artery disease Continue diltiazem 120 mg daily Low-sodium diet, educated about low-sodium options Continue physical therapy and home as tolerated  2.  Paroxysmal Atrial fibrillation Continue apixaban 5 mg tablet twice daily Remote health provider to do home evaluation (physical exam, vitals, CBC BMP, and EKG)  3.  Essential hypertension Continue diltiazem 120 mg daily     COVID-19 Education: The signs and symptoms of COVID-19 were discussed with the patient and how to seek care for testing (follow up with PCP or arrange E-visit).  The importance of social distancing was discussed today.  Patient Risk:  After full review of this patients clinical status, I feel that they are at least moderate risk at this time.  Time:   Today, I have spent 15 minutes with the patient with telehealth technology discussing CAD, heart healthy diet, low-sodium food options, atrial fibrillation, hypertension.     Medication Adjustments/Labs and Tests Ordered: Current medicines are reviewed at length with the patient today.  Concerns regarding medicines are outlined above.   Tests Ordered: No orders of the defined types were placed in this encounter.  Medication Changes: No orders of the defined types were placed in this encounter.   Disposition: Follow-up after remote health visit.  Signed, Deberah Pelton, NP  10/18/2018 4:27 PM    [facility]

## 2018-10-18 NOTE — Telephone Encounter (Signed)
[place] Visit Initial Request  Agency Requested:    [facility] Contact:  Glory Buff, NP [place], [place] [address] Phone #:  [phone] Fax #:  [phone]  Patient Demographic Information: Name:  Roger Blackwell Age:  83 y.o.   DOB:  [birth year]  MRN:  [id number]   Address:   [place] [address]   Phone Numbers:   Home Phone [phone]  Mobile [phone]     Emergency Contact Information on File:   Contact Information    Name Relation Home Work [place] Spouse [phone]  [phone]      The above family members may be contacted for information on this patient (review DPR on file):  Yes    Patient Clinical Information:  Primary Care Provider:  [name], MD  Primary Cardiologist:  No primary care provider on file.  Primary Electrophysiologist:  None   Requesting Provider:  Coletta Memos, NP-C    Past Medical Hx: Roger Blackwell  has a past medical history of CAD (coronary artery disease), CHF (congestive heart failure) ([place]), DM (diabetes mellitus) ([place]), Dyslipidemia, Gout, Morbid obesity ([place]), and Systemic hypertension.   Allergies: He is allergic to codeine; diltiazem; and ivp dye [iodinated diagnostic agents].   Medications: Current Outpatient Medications on File Prior to Visit  Medication Sig  . albuterol (PROVENTIL HFA;VENTOLIN HFA) 108 (90 Base) MCG/ACT inhaler Inhale 2 puffs into the lungs every 6 (six) hours as needed for wheezing or shortness of breath.  . allopurinol (ZYLOPRIM) 100 MG tablet Take 300 mg by mouth daily.   Marland Kitchen apixaban (ELIQUIS) 5 MG TABS tablet Take 1 tablet (5 mg total) by mouth 2 (two) times daily.  Marland Kitchen diltiazem (CARDIZEM CD) 180 MG 24 hr capsule Take 1 capsule (180 mg total) by mouth daily for 30 days. (Patient taking differently: Take 120 mg by mouth daily. )  . ipratropium-albuterol (DUONEB) 0.5-2.5 (3) MG/3ML SOLN Take 3 mLs by  nebulization every 6 (six) hours as needed.  . iron polysaccharides (NIFEREX) 150 MG capsule Take 150 mg by mouth every evening.   . mirtazapine (REMERON) 15 MG tablet Take 1 tablet (15 mg total) by mouth at bedtime.  . Potassium 99 MG TABS Take 99 mg by mouth every other day.   . Travoprost, BAK Free, (TRAVATAN) 0.004 % SOLN ophthalmic solution Place 1 drop into both eyes at bedtime.   No current facility-administered medications on file prior to visit.      Social Hx: He  reports that he quit smoking about 25 years ago. His smoking use included cigarettes. He has never used smokeless tobacco. He reports current alcohol use. He reports that he does not use drugs.    Diagnosis/Reason for Visit:   AFIB W/RVR  Services Requested:  Vital Signs (BP, Pulse, O2, Weight), physical exam and possible EKG, LAB DRAW-CBC AND BMET  # of Visits Needed/Frequency per Week: 1

## 2018-10-18 NOTE — Patient Instructions (Addendum)
Medication Instructions:  NO CHANGES- Your physician recommends that you continue on your current medications as directed. Please refer to the Current Medication list given to you today. If you need a refill on your cardiac medications before your next appointment, please call your pharmacy.  Labwork: CBC AND BMET WITH HOME CARE NURSE HERE IN OUR OFFICE AT [facility]  You will NOT need to fast   Take the provided lab slips with you to the lab for your blood draw.   When you have your labs (blood work) drawn today and your tests are completely normal, you will receive your results only by MyChart Message (if you have MyChart) -OR-  A paper copy in the mail.  If you have any lab test that is abnormal or we need to change your treatment, we will call you to review these results.  Special Instructions: EKG WITH HOMECARE NURSE THEY WILL BE CALLING TO SCHEDULE HOME VISIT.   Follow-Up: You will need a follow up appointment AFTER NURSE VISIT WE WILL CALL TO SCHEDULE F/U APPT.   You may see Sanda Klein, MD, Coletta Memos, NP-C or one of the following Advanced Practice Providers on your designated Care Team:  Almyra Deforest, [place] Fabian Sharp, PA-C    At [facility], you and your health needs are our priority.  As part of our continuing mission to provide you with exceptional heart care, we have created designated Provider Care Teams.  These Care Teams include your primary Cardiologist (physician) and Advanced Practice Providers (APPs -  Physician Assistants and Nurse Practitioners) who all work together to provide you with the care you need, when you need it.  Thank you for choosing [facility] at [facility]!!

## 2018-10-18 NOTE — Progress Notes (Signed)
Error, seen by Coletta Memos, NP

## 2018-10-19 ENCOUNTER — Emergency Department (HOSPITAL_COMMUNITY)
Admission: EM | Admit: 2018-10-19 | Discharge: 2018-10-19 | Disposition: A | Discharge status: Home / Self Care | Payer: Medicare Other | Attending: Emergency Medicine | Admitting: Emergency Medicine

## 2018-10-19 ENCOUNTER — Other Ambulatory Visit: Payer: Self-pay

## 2018-10-19 ENCOUNTER — Emergency Department (HOSPITAL_COMMUNITY): Payer: Medicare Other

## 2018-10-19 ENCOUNTER — Encounter (HOSPITAL_COMMUNITY): Payer: Self-pay

## 2018-10-19 DIAGNOSIS — I5032 Chronic diastolic (congestive) heart failure: Secondary | ICD-10-CM | POA: Insufficient documentation

## 2018-10-19 DIAGNOSIS — I2510 Atherosclerotic heart disease of native coronary artery without angina pectoris: Secondary | ICD-10-CM | POA: Diagnosis not present

## 2018-10-19 DIAGNOSIS — I4891 Unspecified atrial fibrillation: Secondary | ICD-10-CM | POA: Diagnosis not present

## 2018-10-19 DIAGNOSIS — E1122 Type 2 diabetes mellitus with diabetic chronic kidney disease: Secondary | ICD-10-CM | POA: Diagnosis not present

## 2018-10-19 DIAGNOSIS — R4781 Slurred speech: Secondary | ICD-10-CM

## 2018-10-19 DIAGNOSIS — R918 Other nonspecific abnormal finding of lung field: Secondary | ICD-10-CM

## 2018-10-19 DIAGNOSIS — R29810 Facial weakness: Secondary | ICD-10-CM | POA: Diagnosis not present

## 2018-10-19 DIAGNOSIS — Z85118 Personal history of other malignant neoplasm of bronchus and lung: Secondary | ICD-10-CM | POA: Insufficient documentation

## 2018-10-19 DIAGNOSIS — E440 Moderate protein-calorie malnutrition: Secondary | ICD-10-CM | POA: Diagnosis not present

## 2018-10-19 DIAGNOSIS — J850 Gangrene and necrosis of lung: Secondary | ICD-10-CM | POA: Diagnosis not present

## 2018-10-19 DIAGNOSIS — I959 Hypotension, unspecified: Secondary | ICD-10-CM | POA: Diagnosis not present

## 2018-10-19 DIAGNOSIS — I130 Hypertensive heart and chronic kidney disease with heart failure and stage 1 through stage 4 chronic kidney disease, or unspecified chronic kidney disease: Secondary | ICD-10-CM | POA: Diagnosis not present

## 2018-10-19 DIAGNOSIS — I480 Paroxysmal atrial fibrillation: Secondary | ICD-10-CM | POA: Diagnosis not present

## 2018-10-19 DIAGNOSIS — Z7901 Long term (current) use of anticoagulants: Secondary | ICD-10-CM | POA: Diagnosis not present

## 2018-10-19 DIAGNOSIS — C3491 Malignant neoplasm of unspecified part of right bronchus or lung: Secondary | ICD-10-CM | POA: Diagnosis not present

## 2018-10-19 DIAGNOSIS — Z79899 Other long term (current) drug therapy: Secondary | ICD-10-CM | POA: Insufficient documentation

## 2018-10-19 DIAGNOSIS — Z87891 Personal history of nicotine dependence: Secondary | ICD-10-CM | POA: Diagnosis not present

## 2018-10-19 DIAGNOSIS — D509 Iron deficiency anemia, unspecified: Secondary | ICD-10-CM | POA: Diagnosis not present

## 2018-10-19 DIAGNOSIS — I5030 Unspecified diastolic (congestive) heart failure: Secondary | ICD-10-CM | POA: Diagnosis not present

## 2018-10-19 DIAGNOSIS — R05 Cough: Secondary | ICD-10-CM | POA: Diagnosis not present

## 2018-10-19 DIAGNOSIS — H409 Unspecified glaucoma: Secondary | ICD-10-CM | POA: Diagnosis not present

## 2018-10-19 DIAGNOSIS — N183 Chronic kidney disease, stage 3 (moderate): Secondary | ICD-10-CM | POA: Insufficient documentation

## 2018-10-19 DIAGNOSIS — I4892 Unspecified atrial flutter: Secondary | ICD-10-CM | POA: Diagnosis not present

## 2018-10-19 DIAGNOSIS — M109 Gout, unspecified: Secondary | ICD-10-CM | POA: Diagnosis not present

## 2018-10-19 DIAGNOSIS — Z87440 Personal history of urinary (tract) infections: Secondary | ICD-10-CM | POA: Diagnosis not present

## 2018-10-19 DIAGNOSIS — A419 Sepsis, unspecified organism: Secondary | ICD-10-CM | POA: Diagnosis not present

## 2018-10-19 LAB — CBC WITH DIFFERENTIAL/PLATELET
Abs Immature Granulocytes: 0.03 10*3/uL (ref 0.00–0.07)
Basophils Absolute: 0.1 10*3/uL (ref 0.0–0.1)
Basophils Relative: 0 %
Eosinophils Absolute: 0.3 10*3/uL (ref 0.0–0.5)
Eosinophils Relative: 3 %
HCT: 33 % — ABNORMAL LOW (ref 39.0–52.0)
Hemoglobin: 10 g/dL — ABNORMAL LOW (ref 13.0–17.0)
Immature Granulocytes: 0 %
Lymphocytes Relative: 10 %
Lymphs Abs: 1.3 10*3/uL (ref 0.7–4.0)
MCH: 26 pg (ref 26.0–34.0)
MCHC: 30.3 g/dL (ref 30.0–36.0)
MCV: 85.9 fL (ref 80.0–100.0)
Monocytes Absolute: 1 10*3/uL (ref 0.1–1.0)
Monocytes Relative: 8 %
Neutro Abs: 9.9 10*3/uL — ABNORMAL HIGH (ref 1.7–7.7)
Neutrophils Relative %: 79 %
Platelets: 284 10*3/uL (ref 150–400)
RBC: 3.84 MIL/uL — ABNORMAL LOW (ref 4.22–5.81)
RDW: 16.2 % — ABNORMAL HIGH (ref 11.5–15.5)
WBC: 12.5 10*3/uL — ABNORMAL HIGH (ref 4.0–10.5)
nRBC: 0.2 % (ref 0.0–0.2)

## 2018-10-19 LAB — COMPREHENSIVE METABOLIC PANEL
ALT: 16 U/L (ref 0–44)
AST: 17 U/L (ref 15–41)
Albumin: 2.5 g/dL — ABNORMAL LOW (ref 3.5–5.0)
Alkaline Phosphatase: 73 U/L (ref 38–126)
Anion gap: 9 (ref 5–15)
BUN: 23 mg/dL (ref 8–23)
CO2: 27 mmol/L (ref 22–32)
Calcium: 11.1 mg/dL — ABNORMAL HIGH (ref 8.9–10.3)
Chloride: 103 mmol/L (ref 98–111)
Creatinine, Ser: 1.06 mg/dL (ref 0.61–1.24)
GFR calc Af Amer: >60 mL/min (ref >60)
GFR calc non Af Amer: >60 mL/min (ref >60)
Glucose, Bld: 102 mg/dL — ABNORMAL HIGH (ref 70–99)
Potassium: 4 mmol/L (ref 3.5–5.1)
Sodium: 139 mmol/L (ref 135–145)
Total Bilirubin: 0.7 mg/dL (ref 0.3–1.2)
Total Protein: 7.2 g/dL (ref 6.5–8.1)

## 2018-10-19 LAB — URINALYSIS, ROUTINE W REFLEX MICROSCOPIC
Bilirubin Urine: NEGATIVE
Glucose, UA: NEGATIVE mg/dL
Hgb urine dipstick: NEGATIVE
Ketones, ur: 5 mg/dL — AB
Nitrite: NEGATIVE
Protein, ur: 30 mg/dL — AB
Specific Gravity, Urine: 1.018 (ref 1.005–1.030)
WBC, UA: >50 WBC/hpf — ABNORMAL HIGH (ref 0–5)
pH: 5 (ref 5.0–8.0)

## 2018-10-19 MED ORDER — LEVOFLOXACIN 500 MG PO TABS
500.0000 mg | ORAL_TABLET | Freq: Once | ORAL | Status: AC
  Administered 2018-10-19: 22:00:00 500 mg via ORAL
  Filled 2018-10-19: qty 1

## 2018-10-19 MED ORDER — LEVOFLOXACIN 500 MG PO TABS: 500.0000 mg | ORAL_TABLET | Freq: Once | ORAL | Status: DC

## 2018-10-19 MED ORDER — LEVOFLOXACIN 500 MG PO TABS: 500.0000 mg | ORAL_TABLET | Freq: Every day | ORAL | 0 refills | Status: AC

## 2018-10-19 MED ORDER — SODIUM CHLORIDE 0.9 % IV BOLUS
1000.0000 mL | Freq: Once | INTRAVENOUS | Status: AC
  Administered 2018-10-19: 1000 mL via INTRAVENOUS

## 2018-10-19 NOTE — ED Provider Notes (Addendum)
[facility] EMERGENCY DEPARTMENT Provider Note   CSN: [id number] Arrival date & time: 10/19/18  1507    History   Chief Complaint Chief Complaint  Patient presents with   Facial Droop    HPI Roger Blackwell is a 83 y.o. male.     83 yo M with a chief complaints of slurred speech and facial droop.  This was noted by the family earlier today.  The patient does not feel that his speech is changed and also does not feel that he has any weakness to his face or anywhere else.  He denies any complaint currently.  Has had a cough for about a week or so.  Is being treated for a urinary tract infection.  The history is provided by the patient and the EMS personnel.  Illness  Severity:  Moderate Onset quality:  Gradual Duration:  1 day Timing:  Constant Progression:  Worsening Chronicity:  New Associated symptoms: no abdominal pain, no chest pain, no congestion, no diarrhea, no fever, no headaches, no myalgias, no rash, no shortness of breath and no vomiting     Past Medical History:  Diagnosis Date   CAD (coronary artery disease)    CHF (congestive heart failure) (HCC)    DM (diabetes mellitus) ([place])    Dyslipidemia    Gout    Morbid obesity ([place])    Systemic hypertension     Patient Active Problem List   Diagnosis Date Noted   A-fib ([place]) 10/02/2018   Primary cancer of right lower lobe of lung () 10/02/2018   Atrial fibrillation with RVR ([place]) 09/02/2018   DOE (dyspnea on exertion) 07/17/2018   Necrotizing pneumonia ([place]) 06/15/2018   S/P bronchoscopy with biopsy    Malnutrition of moderate degree 05/21/2018   Hemoptysis 05/20/2018   Weight loss    Cough with hemoptysis 11/10/9483   Acute diastolic CHF (congestive heart failure) ([place]) 05/19/2018   Gout 05/19/2018   Community acquired pneumonia of right lung    CKD (chronic kidney disease) stage 3, GFR 30-59 ml/min (HCC) 03/19/2018   Iron deficiency anemia 08/10/2017    Dyslipidemia 12/30/2014   CAD s/p left circumflex coronary stents 2002 05/25/2013   Class 2 severe obesity due to excess calories with serious comorbidity and body mass index (BMI) of 36.0 to 36.9 in adult ([place]) 05/25/2013   Chronic diastolic heart failure ([place]) 05/25/2013   Hypercholesterolemia 05/25/2013   Essential hypertension 05/25/2013   Glaucoma 05/25/2013   Type 2 diabetes mellitus with stage 3 chronic kidney disease, with long-term current use of insulin ([place]) 05/25/2013    Past Surgical History:  Procedure Laterality Date   BACK SURGERY     CORONARY ANGIOPLASTY WITH STENT PLACEMENT  12/26/2000   80% prox. circumflex lesion w/successful stent placement.   NM MYOVIEW LTD  08/31/2010   Normal   PERICARDIOCENTESIS     PROSTATE SURGERY     US ECHOCARDIOGRAPHY  08/31/2010   EF 50-55%,RV mildly dilated,mild aortic root dilatation   VIDEO BRONCHOSCOPY Bilateral 05/23/2018   Procedure: VIDEO BRONCHOSCOPY WITHOUT FLUORO;  Surgeon: Marshell Garfinkel, MD;  Location: [facility];  Service: Cardiopulmonary;  Laterality: Bilateral;        Home Medications    Prior to Admission medications   Medication Sig Start Date End Date Taking? Authorizing Provider  albuterol (PROVENTIL HFA;VENTOLIN HFA) 108 (90 Base) MCG/ACT inhaler Inhale 2 puffs into the lungs every 6 (six) hours as needed for wheezing or shortness of breath. 08/30/18   Lucianne Lei  Donney Rankins, MD  allopurinol (ZYLOPRIM) 100 MG tablet Take 300 mg by mouth daily.     [provider]  apixaban (ELIQUIS) 5 MG TABS tablet Take 1 tablet (5 mg total) by mouth 2 (two) times daily. 09/04/18   Roxan Hockey, MD  diltiazem (CARDIZEM CD) 180 MG 24 hr capsule Take 1 capsule (180 mg total) by mouth daily for 30 days. Patient taking differently: Take 120 mg by mouth daily.  10/04/18 18-Nov-2018  Dessa Phi, DO  ipratropium-albuterol (DUONEB) 0.5-2.5 (3) MG/3ML SOLN Take 3 mLs by nebulization every 6 (six) hours as needed. 09/14/18    Tanda Rockers, MD  iron polysaccharides (NIFEREX) 150 MG capsule Take 150 mg by mouth every evening.     [provider]  levofloxacin (LEVAQUIN) 500 MG tablet Take 1 tablet (500 mg total) by mouth daily for 6 days. 10/19/18 10/25/18  Lorin Glass, PA-C  mirtazapine (REMERON) 15 MG tablet Take 1 tablet (15 mg total) by mouth at bedtime. 09/04/18 09/04/19  Roxan Hockey, MD  Potassium 99 MG TABS Take 99 mg by mouth every other day.     [provider]  Travoprost, BAK Free, (TRAVATAN) 0.004 % SOLN ophthalmic solution Place 1 drop into both eyes at bedtime.    [provider]    Family History Family History  Problem Relation Age of Onset   Diabetes Brother    Hypertension Sister     Social History Social History   Tobacco Use   Smoking status: Former Smoker    Types: Cigarettes    Last attempt to quit: 05/16/1993    Years since quitting: 25.4   Smokeless tobacco: Never Used  Substance Use Topics   Alcohol use: Yes    Comment: seldom   Drug use: No     Allergies   Codeine; Diltiazem; and Ivp dye [iodinated diagnostic agents]   Review of Systems Review of Systems  Constitutional: Negative for chills and fever.  HENT: Negative for congestion and facial swelling.   Eyes: Negative for discharge and visual disturbance.  Respiratory: Negative for shortness of breath.   Cardiovascular: Negative for chest pain and palpitations.  Gastrointestinal: Negative for abdominal pain, diarrhea and vomiting.  Musculoskeletal: Negative for arthralgias and myalgias.  Skin: Negative for color change and rash.  Neurological: Positive for speech difficulty and weakness. Negative for tremors, syncope and headaches.  Psychiatric/Behavioral: Negative for confusion and dysphoric mood.     Physical Exam Updated Vital Signs BP 124/67    Pulse 74    Temp 97.7 F (36.5 C) (Oral)    Resp (!) 23    Ht 6\' 3"  (1.905 m)    Wt 98.8 kg    SpO2 100%    BMI 27.22  kg/m   Physical Exam Vitals signs and nursing note reviewed.  Constitutional:      Appearance: He is well-developed.  HENT:     Head: Normocephalic and atraumatic.  Eyes:     Pupils: Pupils are equal, round, and reactive to light.  Neck:     Musculoskeletal: Normal range of motion and neck supple.     Vascular: No JVD.  Cardiovascular:     Rate and Rhythm: Normal rate and regular rhythm.     Heart sounds: No murmur. No friction rub. No gallop.   Pulmonary:     Effort: No respiratory distress.     Breath sounds: No wheezing.  Abdominal:     General: There is no distension.  Tenderness: There is no guarding or rebound.  Musculoskeletal: Normal range of motion.  Skin:    Coloration: Skin is not pale.     Findings: No rash.  Neurological:     Mental Status: He is alert and oriented to person, place, and time.     GCS: GCS eye subscore is 4. GCS verbal subscore is 5. GCS motor subscore is 6.     Cranial Nerves: Cranial nerves are intact.     Sensory: Sensation is intact.     Motor: Motor function is intact.     Coordination: Finger-Nose-Finger Test normal.     Comments: Patient is unable to perform the heel-to-shin, states that he is not normally ambulatory.  No significant appreciable facial droop.  The patient's speech is somewhat difficult to understand.  Psychiatric:        Behavior: Behavior normal.      ED Treatments / Results  Labs (all labs ordered are listed, but only abnormal results are displayed) Labs Reviewed  COMPREHENSIVE METABOLIC PANEL - Abnormal; Notable for the following components:      Result Value   Glucose, Bld 102 (*)    Calcium 11.1 (*)    Albumin 2.5 (*)    All other components within normal limits  CBC WITH DIFFERENTIAL/PLATELET - Abnormal; Notable for the following components:   WBC 12.5 (*)    RBC 3.84 (*)    Hemoglobin 10.0 (*)    HCT 33.0 (*)    RDW 16.2 (*)    Neutro Abs 9.9 (*)    All other components within normal limits    URINALYSIS, ROUTINE W REFLEX MICROSCOPIC - Abnormal; Notable for the following components:   Color, Urine AMBER (*)    APPearance CLOUDY (*)    Ketones, ur 5 (*)    Protein, ur 30 (*)    Leukocytes,Ua LARGE (*)    WBC, UA >50 (*)    Bacteria, UA FEW (*)    All other components within normal limits  CULTURE, BLOOD (ROUTINE X 2)  CULTURE, BLOOD (ROUTINE X 2)  URINE CULTURE    EKG EKG Interpretation  Date/Time:  Thursday October 19 2018 15:17:43 EDT Ventricular Rate:  136 PR Interval:    QRS Duration: 81 QT Interval:  371 QTC Calculation: 567 R Axis:   19 Text Interpretation:  Atrial flutter Inferior infarct, age indeterminate Prolonged QT interval Since last tracing rate slower Confirmed by Deno Etienne [phone]) on 10/19/2018 4:03:01 PM   Radiology Ct Head Wo Contrast  Result Date: 10/19/2018 CLINICAL DATA:  Facial droop since 0900 hours today. Slurred speech since last night at 1930 hours. EXAM: CT HEAD WITHOUT CONTRAST TECHNIQUE: Contiguous axial images were obtained from the base of the skull through the vertex without intravenous contrast. COMPARISON:  None. FINDINGS: Brain: Low density area in the anterior aspect of each cerebellar hemisphere with an appearance suggesting areas of acute infarction in the axial plane. In the sagittal and coronal planes, these have appearances suggesting normal folia. Mildly enlarged ventricles and cortical sulci. Mild patchy white matter low density in both cerebral hemispheres. No intracranial hemorrhage, mass lesion or CT evidence of acute infarction. Vascular: No hyperdense vessel or unexpected calcification. Skull: Normal. Negative for fracture or focal lesion. Sinuses/Orbits: Status post left cataract extraction. Mild left ethmoid sinus mucosal thickening. Other: None. IMPRESSION: 1. Possible acute bilateral cerebellar hemisphere infarcts. Based on symptoms, further evaluation with brain MRI is recommended. 2. No intracranial hemorrhage. 3. Mild atrophy  and mild chronic  small vessel white matter ischemic changes in both cerebral hemispheres. Electronically Signed   By: Claudie Revering M.D.   On: 10/19/2018 17:02   Mr Brain Wo Contrast  Result Date: 10/19/2018 CLINICAL DATA:  Initial evaluation for acute slurred speech, facial droop. EXAM: MRI HEAD WITHOUT CONTRAST TECHNIQUE: Multiplanar, multiecho pulse sequences of the brain and surrounding structures were obtained without intravenous contrast. COMPARISON:  Prior CT from earlier the same day. FINDINGS: Brain: Generalized age appropriate cerebral atrophy. No significant cerebral white matter disease. No abnormal foci of restricted diffusion to suggest acute or subacute ischemia. Gray-white matter differentiation maintained. No encephalomalacia to suggest chronic cortical infarction. No foci of susceptibility artifact to suggest acute or chronic intracranial hemorrhage. No mass lesion, midline shift or mass effect. No hydrocephalus. No extra-axial fluid collection. Pituitary gland suprasellar region normal. Midline structures intact. Vascular: Major intracranial vascular flow voids are maintained. Skull and upper cervical spine: Craniocervical junction normal. Upper cervical spine within normal limits. Bone marrow signal intensity normal. 13 mm lipoma noted at the right occipital scalp. Scalp soft tissues otherwise unremarkable. Sinuses/Orbits: Patient status post ocular lens replacement on the left. Globes and orbital soft tissues demonstrate no acute finding. Mild scattered mucosal thickening within the ethmoidal air cells. Paranasal sinuses are otherwise clear. No mastoid effusion. Inner ear structures grossly normal. Other: None. IMPRESSION: Normal brain MRI for age. No acute intracranial infarct or other abnormality identified. Electronically Signed   By: Jeannine Boga M.D.   On: 10/19/2018 20:03   Dg Chest Port 1 View  Result Date: 10/19/2018 CLINICAL DATA:  Sepsis. EXAM: PORTABLE CHEST 1 VIEW  COMPARISON:  Chest x-rays 5/18 and 10/10/2018 and chest CT 09/02/2018 FINDINGS: The heart is within normal limits in size and stable. Stable tortuosity of the thoracic aorta. Chronic bilateral lung changes with pleural calcifications and pulmonary scarring. Persistent right basilar process. Difficult to evaluate the cavitary findings when compared to prior CT scan. IMPRESSION: 1. Chronic underlying lung disease with pleural calcifications on the left side. 2. Persistent right basilar process likely combination of effusion, atelectasis and infection. Electronically Signed   By: Marijo Sanes M.D.   On: 10/19/2018 16:31    Procedures Procedures (including critical care time)  Medications Ordered in ED Medications  sodium chloride 0.9 % bolus 1,000 mL (0 mLs Intravenous Stopped 10/19/18 1845)  levofloxacin (LEVAQUIN) tablet 500 mg (500 mg Oral Given 10/19/18 2206)     Initial Impression / Assessment and Plan / ED Course  I have reviewed the triage vital signs and the nursing notes.  Pertinent labs & imaging results that were available during my care of the patient were reviewed by me and considered in my medical decision making (see chart for details).  Clinical Course as of Oct 20 711  Thu Oct 19, 2018  2048 Spoke with Aleene Davidson from pharmacy who requested a repeat EKG due to previously prolonged QT interval prior to giving Levaquin.  At the time EKG was obtained he was significantly tachycardic.  Order placed for repeat EKG.   [EH]  2119 Repeat EKG QTC 501.   [EH]  2123 Spoke with Dylan from pharmacy who reviewed options to treat both the UTI and pneumonia.  Will treat with 500 mg Levaquin as that appears to be the only p.o. agent that would cover both.   [EH]    Clinical Course User Index [EH] Lorin Glass, PA-C       83 yo M with a chief complaint of slurred speech and  facial droop.  Started last night.  Patient denies any slurred speech.  Hard to appreciate a facial droop on my  exam.  Will obtain laboratory work-up CT of the head discussed with neurology.  Patient's records were reviewed and actually had a phone note yesterday with the cardiologist for the family is concerned about hallucinations they thought secondary to his urinary tract infection.  I wonder if his speech symptoms today are related to infection will obtain a portable chest x-ray urine lab work reassess.  CT scan of the head is read as possible bilateral cerebellar infarcts.  I discussed this with Dr. Malen Gauze, neurology he personally reviewed the images and felt that this is very unlikely to be a stroke.  He did recommend an MRI with the patient having slurred speech and facial droop that was noted by the family.  Chest x-ray with a persistent right lower lobe infiltrate that has been seen previously.  With his cough I would likely start him back on antibiotics.  Patient's urine is contaminated.   Awaiting MRI.  Signed out to [employer].  Please see their note for further details or care.   The patients results and plan were reviewed and discussed.   Any x-rays performed were independently reviewed by myself.   Differential diagnosis were considered with the presenting HPI.  Medications  sodium chloride 0.9 % bolus 1,000 mL (0 mLs Intravenous Stopped 10/19/18 1845)  levofloxacin (LEVAQUIN) tablet 500 mg (500 mg Oral Given 10/19/18 2206)    Vitals:   10/19/18 2130 10/19/18 2145 10/19/18 2200 10/19/18 2215  BP: 112/63 112/60 110/62 124/67  Pulse: 74 74 74 74  Resp: 20 19 (!) 23   Temp:      TempSrc:      SpO2: 99% 98% 98% 100%  Weight:      Height:        Final diagnoses:  Slurred speech  Pulmonary infiltrate in right lung on CXR    Admission/ observation were discussed with the admitting physician, patient and/or family and they are comfortable with the plan.    Final Clinical Impressions(s) / ED Diagnoses   Final diagnoses:  Slurred speech  Pulmonary infiltrate in right  lung on CXR    ED Discharge Orders         Ordered    levofloxacin (LEVAQUIN) 500 MG tablet  Daily     10/19/18 2037           Deno Etienne, DO 10/19/18 [place], [place], DO 10/20/18 [id number]

## 2018-10-19 NOTE — ED Notes (Signed)
Labs being drawn at this time.  Pt talking on phone with family

## 2018-10-19 NOTE — ED Notes (Signed)
Pt to MRI at this time.

## 2018-10-19 NOTE — Discharge Instructions (Signed)
Today the MRI was reassuring.  With your history and the x-ray there is concern that you may have a pneumonia therefore we have changed your antibiotic.  You were given your first dose while in the emergency room.  Please do not take the next dose until tomorrow night.

## 2018-10-19 NOTE — Telephone Encounter (Signed)
[facility] visit note and telephone note to fax number via EPIC.

## 2018-10-19 NOTE — Progress Notes (Addendum)
[name]       DOB: 1935/10/25  Purpose of Visit: VS, Wt, assessment, EKG, CBC, BMET Cardic provider: Coletta Memos, NP  Medications: Is the patient taking all medications listed on MAR from Epic? Yes per wife, Peter Congo List any medications that are not being taken correctly: no  List any medication refills needed: no  Is the patient able to pick up medications? Yes  Vitals: BP:  92/60   HR: 79-140 irregular   Oxygen:  93-94% RA Weight: He is unable to stand to weigh.   Wife states he hasn't been able to stand/walk x last week or so.  She said one of the visiting [facility] personal had come by today to work with him and it took the 2 of them to stand him up and put him into a wheelchair.  The [facility] person ordered a new, lighter weight w/c in order for Ms. Lux to be able to handle it and for it to get through their home.  Ms. Marro also ordered a ramp for the stairs leading into the house.  She has been assisting him w/ a urinal to void as well as an adult brief and changes it for him in the recliner.        Physical Exam:  Lung sounds: wheezing  Heart sounds: irregular  Peripheral edema: yes  Wounds: none reported  Location:  Any patient concerns?  Upon my arrival, Mrs. Depaz showed me in and to the room where Mr. Higbie was sitting in a recliner.  When asked, pt stated "I feel good".  Immediately noticed slurred speech, a generalized weakness, and LT sided facial droop. Further neuro assessment showed tongue drifted to the LT, grips were equal but weak, arms and legs w/o drift.  He denied pain, shob, nausea.  Skin was cool but dry. Facial droop and slurred speech noticed by wife early in the morning >4 hrs prior to the visit by myself. Wife stated he has been on abx for UTI x last few weeks and has been having hallucinations.  Last night he was on a 1-night sleep study device and Mrs. Burnham said he didn't sleep well.  She said she noticed his slurred speech last night but  thought it was just him having hallucinations.  She noticed his facial droop earlier in the am when another [facility] RN was there and mentioned it to the nurse who said she thought it was how he was leaning in the chair.  Mrs. Brickle said his frequent dozing was not typical for him.   Wife stated he has not been eating much; only 1-2 diabetic ensures/day and maybe some applesauce and has lost a lot of weight in the recent months, and mentioned a recent dx of stage 3 lung CA.    Rhythm Strip: "possible AFIB 146"  Is Home Health recommended?  Mr. Alligood appears to be getting [facility] services as well as home PT services per the wife.  Sounds as though both had been to the house to see him prior to my visit at 1400.  Though Mr. Mincey expressed not wanting to go to the hospital and saying he felt good, clinically he appeared to need further medical treatment.  He and Mrs. Kisling expressed wanting him to be a full code when asked.  I did consult with the [facility] w/ Joelene Millin, RN and Selinda Eon, NP w/[facility] who were in agreement that he should be evaluated in the ED and 911 was  called to send EMS.  [facility] arrived and obtained a similar BP & HR.  EMS did a 12-lead EKG showing AFIB 75-120's and CBG 138.   Labs were not obtained as ordered for home visit since he was going to the [facility] ED.    Vanita Ingles, RN 10/19/18

## 2018-10-19 NOTE — Telephone Encounter (Signed)
Faxed vi Epic, with progress note

## 2018-10-19 NOTE — ED Provider Notes (Signed)
I assumed care of patient at shift change from Dr. Tyrone Nine.  Briefly patient is here for evaluation of slurred speech and facial droop that was noted by the family earlier today.  Patient is currently being treated for a urinary tract infection.  Please see Dr. Miachel Roux note for full H&P.  Physical Exam  BP 111/65 (BP Location: Right Arm)   Pulse 85   Temp 97.9 F (36.6 C) (Oral)   Resp 18   Ht 6\' 3"  (1.905 m)   Wt 98.8 kg   SpO2 94%   BMI 27.22 kg/m   Physical Exam Vitals signs and nursing note reviewed.  Constitutional:      General: He is not in acute distress. HENT:     Mouth/Throat:     Mouth: Mucous membranes are moist.  Neurological:     Mental Status: He is alert. Mental status is at baseline.  Psychiatric:        Mood and Affect: Mood normal.     ED Course/Procedures   Clinical Course as of Oct 19 2123  Thu Oct 19, 2018  2048 Spoke with Aleene Davidson from pharmacy who requested a repeat EKG due to previously prolonged QT interval prior to giving Levaquin.  At the time EKG was obtained he was significantly tachycardic.  Order placed for repeat EKG.   [EH]  2119 Repeat EKG QTC 501.   [EH]  2123 Spoke with Dylan from pharmacy who reviewed options to treat both the UTI and pneumonia.  Will treat with 500 mg Levaquin as that appears to be the only p.o. agent that would cover both.   [EH]    Clinical Course User Index [EH] Lorin Glass, PA-C    Procedures   Ct Head Wo Contrast  Result Date: 10/19/2018 CLINICAL DATA:  Facial droop since 0900 hours today. Slurred speech since last night at 1930 hours. EXAM: CT HEAD WITHOUT CONTRAST TECHNIQUE: Contiguous axial images were obtained from the base of the skull through the vertex without intravenous contrast. COMPARISON:  None. FINDINGS: Brain: Low density area in the anterior aspect of each cerebellar hemisphere with an appearance suggesting areas of acute infarction in the axial plane. In the sagittal and coronal planes,  these have appearances suggesting normal folia. Mildly enlarged ventricles and cortical sulci. Mild patchy white matter low density in both cerebral hemispheres. No intracranial hemorrhage, mass lesion or CT evidence of acute infarction. Vascular: No hyperdense vessel or unexpected calcification. Skull: Normal. Negative for fracture or focal lesion. Sinuses/Orbits: Status post left cataract extraction. Mild left ethmoid sinus mucosal thickening. Other: None. IMPRESSION: 1. Possible acute bilateral cerebellar hemisphere infarcts. Based on symptoms, further evaluation with brain MRI is recommended. 2. No intracranial hemorrhage. 3. Mild atrophy and mild chronic small vessel white matter ischemic changes in both cerebral hemispheres. Electronically Signed   By: Claudie Revering M.D.   On: 10/19/2018 17:02   Mr Brain Wo Contrast  Result Date: 10/19/2018 CLINICAL DATA:  Initial evaluation for acute slurred speech, facial droop. EXAM: MRI HEAD WITHOUT CONTRAST TECHNIQUE: Multiplanar, multiecho pulse sequences of the brain and surrounding structures were obtained without intravenous contrast. COMPARISON:  Prior CT from earlier the same day. FINDINGS: Brain: Generalized age appropriate cerebral atrophy. No significant cerebral white matter disease. No abnormal foci of restricted diffusion to suggest acute or subacute ischemia. Gray-white matter differentiation maintained. No encephalomalacia to suggest chronic cortical infarction. No foci of susceptibility artifact to suggest acute or chronic intracranial hemorrhage. No mass lesion, midline shift or mass  effect. No hydrocephalus. No extra-axial fluid collection. Pituitary gland suprasellar region normal. Midline structures intact. Vascular: Major intracranial vascular flow voids are maintained. Skull and upper cervical spine: Craniocervical junction normal. Upper cervical spine within normal limits. Bone marrow signal intensity normal. 13 mm lipoma noted at the right  occipital scalp. Scalp soft tissues otherwise unremarkable. Sinuses/Orbits: Patient status post ocular lens replacement on the left. Globes and orbital soft tissues demonstrate no acute finding. Mild scattered mucosal thickening within the ethmoidal air cells. Paranasal sinuses are otherwise clear. No mastoid effusion. Inner ear structures grossly normal. Other: None. IMPRESSION: Normal brain MRI for age. No acute intracranial infarct or other abnormality identified. Electronically Signed   By: Jeannine Boga M.D.   On: 10/19/2018 20:03   Dg Chest Port 1 View  Result Date: 10/19/2018 CLINICAL DATA:  Sepsis. EXAM: PORTABLE CHEST 1 VIEW COMPARISON:  Chest x-rays 5/18 and 10/10/2018 and chest CT 09/02/2018 FINDINGS: The heart is within normal limits in size and stable. Stable tortuosity of the thoracic aorta. Chronic bilateral lung changes with pleural calcifications and pulmonary scarring. Persistent right basilar process. Difficult to evaluate the cavitary findings when compared to prior CT scan. IMPRESSION: 1. Chronic underlying lung disease with pleural calcifications on the left side. 2. Persistent right basilar process likely combination of effusion, atelectasis and infection. Electronically Signed   By: Marijo Sanes M.D.   On: 10/19/2018 16:31   Dg Chest Port 1 View  Result Date: 10/02/2018 CLINICAL DATA:  Atrial fibrillation with rapid ventricular response and hypotension. History of squamous cell lung cancer. EXAM: PORTABLE CHEST 1 VIEW COMPARISON:  Radiographs and CT 09/02/2018. FINDINGS: 1408 hours. The heart size and mediastinal contours are grossly stable with persistent right hilar prominence. The known cavitary lesion posteriorly in the right hemithorax is partly obscured by pleuroparenchymal opacity. Pleural calcifications are present bilaterally. Overall appearance is similar to the prior radiographs, and no superimposed airspace disease or pneumothorax identified. The bones appear  unchanged. IMPRESSION: No significant change is seen from the prior studies of 1 month ago. No acute superimposed process identified. Electronically Signed   By: Richardean Sale M.D.   On: 10/02/2018 14:46    Labs Reviewed  COMPREHENSIVE METABOLIC PANEL - Abnormal; Notable for the following components:      Result Value   Glucose, Bld 102 (*)    Calcium 11.1 (*)    Albumin 2.5 (*)    All other components within normal limits  CBC WITH DIFFERENTIAL/PLATELET - Abnormal; Notable for the following components:   WBC 12.5 (*)    RBC 3.84 (*)    Hemoglobin 10.0 (*)    HCT 33.0 (*)    RDW 16.2 (*)    Neutro Abs 9.9 (*)    All other components within normal limits  URINALYSIS, ROUTINE W REFLEX MICROSCOPIC - Abnormal; Notable for the following components:   Color, Urine AMBER (*)    APPearance CLOUDY (*)    Ketones, ur 5 (*)    Protein, ur 30 (*)    Leukocytes,Ua LARGE (*)    WBC, UA >50 (*)    Bacteria, UA FEW (*)    All other components within normal limits  CULTURE, BLOOD (ROUTINE X 2)  CULTURE, BLOOD (ROUTINE X 2)  URINE CULTURE     MDM   Plan is to follow-up on MRI, if MRI is normal then discharge home with change in prescription to Levaquin 500 mg every day for 7 days.  If MRI is positive for acute  abnormalities then admit.  MRI does not show evidence of acute ischemia.  EKG obtained on arrival, when patient was tachycardic, showed prolonged QT interval.  Pharmacy called stating that with the prolonged QT interval they are hesitant about Levaquin.  Explained that with other calculators and formulas the QT interval is not as long as it appears.  Will obtain repeat EKG.  If repeat EKG does not show significant QT prolongation will discharge with Levaquin.  Repeat EKG shows reduced QTC.  Spoke with pharmacy, will give rx for Levaquin.       Lorin Glass, PA-C 10/20/18 1706    Drenda Freeze, MD 10/26/18 1504

## 2018-10-19 NOTE — ED Triage Notes (Signed)
Pt arrives [facility] for eval of facial droop onset this AM @0900 . EMS also reports wife noted slurred speech onset last night at [place]. Pt is A&Ox4, GCS 15. Speech is thick and garbled, but sensical.

## 2018-10-19 NOTE — ED Notes (Signed)
Waiting for Levaquin from pharmacy.  [facility] called to transport pt home

## 2018-10-20 DIAGNOSIS — J449 Chronic obstructive pulmonary disease, unspecified: Secondary | ICD-10-CM | POA: Diagnosis not present

## 2018-10-22 LAB — URINE CULTURE: Culture: >=100000 — AB

## 2018-10-23 ENCOUNTER — Emergency Department (HOSPITAL_COMMUNITY): Payer: Medicare Other

## 2018-10-23 ENCOUNTER — Inpatient Hospital Stay (HOSPITAL_COMMUNITY)
Admission: EM | Admit: 2018-10-23 | Discharge: 2018-11-15 | DRG: 193 | Disposition: E | Payer: Medicare Other | Attending: Internal Medicine | Admitting: Internal Medicine

## 2018-10-23 ENCOUNTER — Telehealth: Payer: Self-pay

## 2018-10-23 ENCOUNTER — Encounter (HOSPITAL_COMMUNITY): Payer: Self-pay

## 2018-10-23 ENCOUNTER — Other Ambulatory Visit: Payer: Self-pay

## 2018-10-23 ENCOUNTER — Telehealth: Payer: Self-pay | Admitting: Internal Medicine

## 2018-10-23 DIAGNOSIS — Z452 Encounter for adjustment and management of vascular access device: Secondary | ICD-10-CM

## 2018-10-23 DIAGNOSIS — R042 Hemoptysis: Secondary | ICD-10-CM | POA: Diagnosis not present

## 2018-10-23 DIAGNOSIS — Z6824 Body mass index (BMI) 24.0-24.9, adult: Secondary | ICD-10-CM | POA: Diagnosis not present

## 2018-10-23 DIAGNOSIS — E46 Unspecified protein-calorie malnutrition: Secondary | ICD-10-CM | POA: Diagnosis not present

## 2018-10-23 DIAGNOSIS — N183 Chronic kidney disease, stage 3 unspecified: Secondary | ICD-10-CM | POA: Diagnosis present

## 2018-10-23 DIAGNOSIS — I480 Paroxysmal atrial fibrillation: Secondary | ICD-10-CM | POA: Diagnosis not present

## 2018-10-23 DIAGNOSIS — Z7901 Long term (current) use of anticoagulants: Secondary | ICD-10-CM

## 2018-10-23 DIAGNOSIS — E875 Hyperkalemia: Secondary | ICD-10-CM | POA: Diagnosis present

## 2018-10-23 DIAGNOSIS — E1122 Type 2 diabetes mellitus with diabetic chronic kidney disease: Secondary | ICD-10-CM | POA: Diagnosis present

## 2018-10-23 DIAGNOSIS — I10 Essential (primary) hypertension: Secondary | ICD-10-CM | POA: Diagnosis present

## 2018-10-23 DIAGNOSIS — R40243 Glasgow coma scale score 3-8, unspecified time: Secondary | ICD-10-CM | POA: Diagnosis not present

## 2018-10-23 DIAGNOSIS — R52 Pain, unspecified: Secondary | ICD-10-CM

## 2018-10-23 DIAGNOSIS — J189 Pneumonia, unspecified organism: Principal | ICD-10-CM | POA: Diagnosis present

## 2018-10-23 DIAGNOSIS — Z20828 Contact with and (suspected) exposure to other viral communicable diseases: Secondary | ICD-10-CM | POA: Diagnosis present

## 2018-10-23 DIAGNOSIS — I5032 Chronic diastolic (congestive) heart failure: Secondary | ICD-10-CM | POA: Diagnosis present

## 2018-10-23 DIAGNOSIS — Z79899 Other long term (current) drug therapy: Secondary | ICD-10-CM

## 2018-10-23 DIAGNOSIS — R0602 Shortness of breath: Secondary | ICD-10-CM | POA: Diagnosis not present

## 2018-10-23 DIAGNOSIS — I5030 Unspecified diastolic (congestive) heart failure: Secondary | ICD-10-CM | POA: Diagnosis not present

## 2018-10-23 DIAGNOSIS — D509 Iron deficiency anemia, unspecified: Secondary | ICD-10-CM | POA: Diagnosis not present

## 2018-10-23 DIAGNOSIS — G9340 Encephalopathy, unspecified: Secondary | ICD-10-CM | POA: Diagnosis not present

## 2018-10-23 DIAGNOSIS — J9602 Acute respiratory failure with hypercapnia: Secondary | ICD-10-CM | POA: Diagnosis not present

## 2018-10-23 DIAGNOSIS — J440 Chronic obstructive pulmonary disease with acute lower respiratory infection: Secondary | ICD-10-CM | POA: Diagnosis not present

## 2018-10-23 DIAGNOSIS — Z66 Do not resuscitate: Secondary | ICD-10-CM | POA: Diagnosis not present

## 2018-10-23 DIAGNOSIS — L8990 Pressure ulcer of unspecified site, unspecified stage: Secondary | ICD-10-CM | POA: Insufficient documentation

## 2018-10-23 DIAGNOSIS — D500 Iron deficiency anemia secondary to blood loss (chronic): Secondary | ICD-10-CM | POA: Diagnosis present

## 2018-10-23 DIAGNOSIS — R0902 Hypoxemia: Secondary | ICD-10-CM | POA: Diagnosis not present

## 2018-10-23 DIAGNOSIS — R05 Cough: Secondary | ICD-10-CM | POA: Diagnosis not present

## 2018-10-23 DIAGNOSIS — I4891 Unspecified atrial fibrillation: Secondary | ICD-10-CM | POA: Diagnosis not present

## 2018-10-23 DIAGNOSIS — E440 Moderate protein-calorie malnutrition: Secondary | ICD-10-CM | POA: Diagnosis not present

## 2018-10-23 DIAGNOSIS — M160 Bilateral primary osteoarthritis of hip: Secondary | ICD-10-CM | POA: Diagnosis present

## 2018-10-23 DIAGNOSIS — H409 Unspecified glaucoma: Secondary | ICD-10-CM | POA: Diagnosis not present

## 2018-10-23 DIAGNOSIS — C3491 Malignant neoplasm of unspecified part of right bronchus or lung: Secondary | ICD-10-CM | POA: Diagnosis not present

## 2018-10-23 DIAGNOSIS — W19XXXA Unspecified fall, initial encounter: Secondary | ICD-10-CM

## 2018-10-23 DIAGNOSIS — D631 Anemia in chronic kidney disease: Secondary | ICD-10-CM | POA: Diagnosis not present

## 2018-10-23 DIAGNOSIS — C3431 Malignant neoplasm of lower lobe, right bronchus or lung: Secondary | ICD-10-CM | POA: Diagnosis not present

## 2018-10-23 DIAGNOSIS — I2510 Atherosclerotic heart disease of native coronary artery without angina pectoris: Secondary | ICD-10-CM | POA: Diagnosis present

## 2018-10-23 DIAGNOSIS — E785 Hyperlipidemia, unspecified: Secondary | ICD-10-CM | POA: Diagnosis present

## 2018-10-23 DIAGNOSIS — M109 Gout, unspecified: Secondary | ICD-10-CM | POA: Diagnosis present

## 2018-10-23 DIAGNOSIS — R29810 Facial weakness: Secondary | ICD-10-CM | POA: Diagnosis not present

## 2018-10-23 DIAGNOSIS — J850 Gangrene and necrosis of lung: Secondary | ICD-10-CM | POA: Diagnosis not present

## 2018-10-23 DIAGNOSIS — L8942 Pressure ulcer of contiguous site of back, buttock and hip, stage 2: Secondary | ICD-10-CM | POA: Diagnosis not present

## 2018-10-23 DIAGNOSIS — L89302 Pressure ulcer of unspecified buttock, stage 2: Secondary | ICD-10-CM | POA: Diagnosis not present

## 2018-10-23 DIAGNOSIS — Z955 Presence of coronary angioplasty implant and graft: Secondary | ICD-10-CM

## 2018-10-23 DIAGNOSIS — I130 Hypertensive heart and chronic kidney disease with heart failure and stage 1 through stage 4 chronic kidney disease, or unspecified chronic kidney disease: Secondary | ICD-10-CM | POA: Diagnosis not present

## 2018-10-23 DIAGNOSIS — Z515 Encounter for palliative care: Secondary | ICD-10-CM | POA: Diagnosis not present

## 2018-10-23 DIAGNOSIS — Z85118 Personal history of other malignant neoplasm of bronchus and lung: Secondary | ICD-10-CM

## 2018-10-23 DIAGNOSIS — Z9842 Cataract extraction status, left eye: Secondary | ICD-10-CM

## 2018-10-23 DIAGNOSIS — L89152 Pressure ulcer of sacral region, stage 2: Secondary | ICD-10-CM | POA: Diagnosis present

## 2018-10-23 DIAGNOSIS — F329 Major depressive disorder, single episode, unspecified: Secondary | ICD-10-CM | POA: Diagnosis present

## 2018-10-23 DIAGNOSIS — D638 Anemia in other chronic diseases classified elsewhere: Secondary | ICD-10-CM

## 2018-10-23 DIAGNOSIS — J181 Lobar pneumonia, unspecified organism: Secondary | ICD-10-CM | POA: Diagnosis not present

## 2018-10-23 DIAGNOSIS — F17210 Nicotine dependence, cigarettes, uncomplicated: Secondary | ICD-10-CM | POA: Diagnosis present

## 2018-10-23 LAB — CBC WITH DIFFERENTIAL/PLATELET
Abs Immature Granulocytes: 0.06 10*3/uL (ref 0.00–0.07)
Basophils Absolute: 0 10*3/uL (ref 0.0–0.1)
Basophils Relative: 0 %
Eosinophils Absolute: 0.3 10*3/uL (ref 0.0–0.5)
Eosinophils Relative: 2 %
HCT: 29.8 % — ABNORMAL LOW (ref 39.0–52.0)
Hemoglobin: 9.1 g/dL — ABNORMAL LOW (ref 13.0–17.0)
Immature Granulocytes: 0 %
Lymphocytes Relative: 9 %
Lymphs Abs: 1.2 10*3/uL (ref 0.7–4.0)
MCH: 26.5 pg (ref 26.0–34.0)
MCHC: 30.5 g/dL (ref 30.0–36.0)
MCV: 86.9 fL (ref 80.0–100.0)
Monocytes Absolute: 1.2 10*3/uL — ABNORMAL HIGH (ref 0.1–1.0)
Monocytes Relative: 9 %
Neutro Abs: 10.9 10*3/uL — ABNORMAL HIGH (ref 1.7–7.7)
Neutrophils Relative %: 80 %
Platelets: 298 10*3/uL (ref 150–400)
RBC: 3.43 MIL/uL — ABNORMAL LOW (ref 4.22–5.81)
RDW: 16.5 % — ABNORMAL HIGH (ref 11.5–15.5)
WBC: 13.6 10*3/uL — ABNORMAL HIGH (ref 4.0–10.5)
nRBC: 0.1 % (ref 0.0–0.2)

## 2018-10-23 LAB — PROTIME-INR
INR: 1.7 — ABNORMAL HIGH (ref 0.8–1.2)
Prothrombin Time: 19.5 seconds — ABNORMAL HIGH (ref 11.4–15.2)

## 2018-10-23 LAB — BASIC METABOLIC PANEL
Anion gap: 6 (ref 5–15)
BUN: 24 mg/dL — ABNORMAL HIGH (ref 8–23)
CO2: 24 mmol/L (ref 22–32)
Calcium: 10.5 mg/dL — ABNORMAL HIGH (ref 8.9–10.3)
Chloride: 104 mmol/L (ref 98–111)
Creatinine, Ser: 1.39 mg/dL — ABNORMAL HIGH (ref 0.61–1.24)
GFR calc Af Amer: 55 mL/min — ABNORMAL LOW (ref >60)
GFR calc non Af Amer: 48 mL/min — ABNORMAL LOW (ref >60)
Glucose, Bld: 91 mg/dL (ref 70–99)
Potassium: >7.5 mmol/L (ref 3.5–5.1)
Sodium: 134 mmol/L — ABNORMAL LOW (ref 135–145)

## 2018-10-23 LAB — POTASSIUM: Potassium: 4.2 mmol/L (ref 3.5–5.1)

## 2018-10-23 LAB — TYPE AND SCREEN
ABO/RH(D): A NEG
Antibody Screen: NEGATIVE

## 2018-10-23 LAB — SARS CORONAVIRUS 2 BY RT PCR (HOSPITAL ORDER, PERFORMED IN ~~LOC~~ HOSPITAL LAB): SARS Coronavirus 2: NEGATIVE

## 2018-10-23 MED ORDER — LATANOPROST 0.005 % OP SOLN
1.0000 [drp] | Freq: Every day | OPHTHALMIC | Status: DC
  Administered 2018-10-24 – 2018-11-02 (×10): 1 [drp] via OPHTHALMIC
  Filled 2018-10-23: qty 2.5

## 2018-10-23 MED ORDER — ALLOPURINOL 100 MG PO TABS
200.0000 mg | ORAL_TABLET | Freq: Every day | ORAL | Status: DC
  Administered 2018-10-24 – 2018-11-01 (×9): 200 mg via ORAL
  Filled 2018-10-23 (×11): qty 2

## 2018-10-23 MED ORDER — IPRATROPIUM-ALBUTEROL 0.5-2.5 (3) MG/3ML IN SOLN: 3.0000 mL | Freq: Four times a day (QID) | RESPIRATORY_TRACT | Status: DC | PRN

## 2018-10-23 MED ORDER — ONDANSETRON HCL 4 MG/2ML IJ SOLN
4.0000 mg | Freq: Four times a day (QID) | INTRAMUSCULAR | Status: DC | PRN
  Filled 2018-10-23: qty 2

## 2018-10-23 MED ORDER — POTASSIUM 99 MG PO TABS: 99.0000 mg | ORAL_TABLET | Freq: Every day | ORAL | Status: DC

## 2018-10-23 MED ORDER — ACETAMINOPHEN 650 MG RE SUPP: 650.0000 mg | Freq: Four times a day (QID) | RECTAL | Status: DC | PRN

## 2018-10-23 MED ORDER — MIRTAZAPINE 15 MG PO TABS
15.0000 mg | ORAL_TABLET | Freq: Every day | ORAL | Status: DC
  Administered 2018-10-24 – 2018-11-01 (×10): 15 mg via ORAL
  Filled 2018-10-23 (×11): qty 1

## 2018-10-23 MED ORDER — ALBUTEROL SULFATE (2.5 MG/3ML) 0.083% IN NEBU: 3.0000 mL | INHALATION_SOLUTION | Freq: Four times a day (QID) | RESPIRATORY_TRACT | Status: DC | PRN

## 2018-10-23 MED ORDER — ONDANSETRON HCL 4 MG PO TABS: 4.0000 mg | ORAL_TABLET | Freq: Four times a day (QID) | ORAL | Status: DC | PRN

## 2018-10-23 MED ORDER — PIPERACILLIN-TAZOBACTAM 3.375 G IVPB
3.3750 g | Freq: Three times a day (TID) | INTRAVENOUS | Status: AC
  Administered 2018-10-24 – 2018-11-01 (×26): 3.375 g via INTRAVENOUS
  Filled 2018-10-23 (×27): qty 50

## 2018-10-23 MED ORDER — DILTIAZEM HCL ER COATED BEADS 120 MG PO CP24
120.0000 mg | ORAL_CAPSULE | Freq: Every day | ORAL | Status: DC
  Administered 2018-10-24 – 2018-10-26 (×3): 120 mg via ORAL
  Filled 2018-10-23 (×4): qty 1

## 2018-10-23 MED ORDER — ACETAMINOPHEN 325 MG PO TABS
650.0000 mg | ORAL_TABLET | Freq: Four times a day (QID) | ORAL | Status: DC | PRN
  Administered 2018-10-28: 650 mg via ORAL
  Filled 2018-10-23: qty 2

## 2018-10-23 NOTE — ED Notes (Signed)
Potassium 11.8 per lab, adds that tube is hemolyzed.

## 2018-10-23 NOTE — ED Notes (Signed)
Bed: WA03 Expected date:  Expected time:  Means of arrival:  Comments: EMS 82yo m, lung CA, coughing up blood

## 2018-10-23 NOTE — ED Notes (Signed)
Recollected BMP and sent to lab.

## 2018-10-23 NOTE — Telephone Encounter (Signed)
Post ED Visit - Positive Culture Follow-up  Culture report reviewed by antimicrobial stewardship pharmacist: [place] Team []  Elenor Quinones, Pharm.D. []  Heide Guile, Pharm.D., BCPS AQ-ID []  Parks Neptune, Pharm.D., BCPS []  Alycia Rossetti, Pharm.D., BCPS []  Cullen, Pharm.D., BCPS, AAHIVP []  Legrand Como, Pharm.D., BCPS, AAHIVP []  Salome Arnt, PharmD, BCPS []  Johnnette Gourd, PharmD, BCPS []  Hughes Better, PharmD, BCPS []  Leeroy Cha, PharmD []  Laqueta Linden, PharmD, BCPS []  Albertina Parr, PharmD [facility] []  Leodis Sias, PharmD []  Lindell Spar, PharmD []  Royetta Asal, PharmD []  Graylin Shiver, Rph []  Rema Fendt) Glennon Mac, PharmD []  Arlyn Dunning, PharmD []  Netta Cedars, PharmD []  Dia Sitter, PharmD []  Leone Haven, PharmD []  Gretta Arab, PharmD []  Theodis Shove, PharmD []  Peggyann Juba, PharmD []  Reuel Boom, PharmD   Positive urine culture Treated with Levofloxacin, organism sensitive to the same and no further patient follow-up is required at this time.  Genia Del 11/05/2018, 9:19 AM

## 2018-10-23 NOTE — H&P (Addendum)
History and Physical    Roger Blackwell PYP:[id number] DOB: 1935-11-28 DOA: 10/19/2018  PCP: [name], MD  Patient coming from: Home.  Chief Complaint: Coughing up blood.  HPI: Roger Blackwell is a 83 y.o. male with history of DM, diastolic CHF, A. fib, hypertension, recently diagnosed, cell carcinoma of the lung treatment is to be started, anemia, chronic and disease recent admitted last month A. fib with RVR with previous history of necrotizing pneumonia was brought to the ER after patient was having increasing hemoptysis over the last 2 days.  5 days ago patient had come to the ER with slurred speech at that time MRI of the brain was unremarkable.  Chest x-ray showed infiltrate and was started on Levaquin.  Patient is a poor historian.  Patient states that he has been coughing up some blood last 2 days denies any chest pain fever chills.  Denies any shortness of breath.  ED Course: In the ER patient had a CT chest which shows worsening right hilar and subcarinal mass with progressive narrowing of the bronchus and also a worsening consolidation.  Lab work show hemoglobin to be at baseline creatinine at baseline.  INR is 1.7.  EKG shows A. fib with heart rate in the 120s.  Heart rate improved to 104 without any intervention.  Initial potassium was more than 7.5 it was hemolyzed repeat was 4.2.  Leukocytosis with 13.6.  Patient is being admitted for hemoptysis and also since patient is having worsening consolidation will place patient on antibiotics.  Review of Systems: As per HPI, rest all negative.   Past Medical History:  Diagnosis Date   CAD (coronary artery disease)    CHF (congestive heart failure) (HCC)    DM (diabetes mellitus) ([place])    Dyslipidemia    Gout    Morbid obesity ([place])    Systemic hypertension     Past Surgical History:  Procedure Laterality Date   BACK SURGERY     CORONARY ANGIOPLASTY WITH STENT PLACEMENT  12/26/2000   80% prox. circumflex lesion  w/successful stent placement.   NM MYOVIEW LTD  08/31/2010   Normal   PERICARDIOCENTESIS     PROSTATE SURGERY     US ECHOCARDIOGRAPHY  08/31/2010   EF 50-55%,RV mildly dilated,mild aortic root dilatation   VIDEO BRONCHOSCOPY Bilateral 05/23/2018   Procedure: VIDEO BRONCHOSCOPY WITHOUT FLUORO;  Surgeon: Marshell Garfinkel, MD;  Location: [facility];  Service: Cardiopulmonary;  Laterality: Bilateral;     reports that he quit smoking about 25 years ago. His smoking use included cigarettes. He has never used smokeless tobacco. He reports current alcohol use. He reports that he does not use drugs.  Allergies  Allergen Reactions   Codeine Nausea And Vomiting and Other (See Comments)    Made the patient feel badly, also   Diltiazem Nausea Only    Hallucinations, memory loss   Ivp Dye [Iodinated Diagnostic Agents] Other (See Comments)    Blisters all over the body    Family History  Problem Relation Age of Onset   Diabetes Brother    Hypertension Sister     Prior to Admission medications   Medication Sig Start Date End Date Taking? Authorizing Provider  albuterol (PROVENTIL HFA;VENTOLIN HFA) 108 (90 Base) MCG/ACT inhaler Inhale 2 puffs into the lungs every 6 (six) hours as needed for wheezing or shortness of breath. 08/30/18  Yes Ivin Poot, MD  allopurinol (ZYLOPRIM) 100 MG tablet Take 200 mg by mouth daily.    Yes [provider]  apixaban (ELIQUIS) 5 MG TABS tablet Take 1 tablet (5 mg total) by mouth 2 (two) times daily. 09/04/18  Yes Emokpae, Courage, MD  diltiazem (CARDIZEM CD) 120 MG 24 hr capsule Take 120 mg by mouth daily.  10/10/18  Yes [provider]  Ferrous Sulfate (IRON PO) Take 2 tablets by mouth every evening.   Yes [provider]  levofloxacin (LEVAQUIN) 500 MG tablet Take 1 tablet (500 mg total) by mouth daily for 6 days. 10/19/18 10/25/18 Yes Lorin Glass, PA-C  mirtazapine (REMERON) 15 MG tablet Take 1 tablet (15 mg total) by  mouth at bedtime. 09/04/18 09/04/19 Yes Emokpae, Courage, MD  Potassium 99 MG TABS Take 99 mg by mouth daily.    Yes [provider]  Travoprost, BAK Free, (TRAVATAN) 0.004 % SOLN ophthalmic solution Place 1 drop into both eyes at bedtime.   Yes [provider]  diltiazem (CARDIZEM CD) 180 MG 24 hr capsule Take 1 capsule (180 mg total) by mouth daily for 30 days. Patient not taking: Reported on 10/21/2018 10/04/18 2018-11-05  Dessa Phi, DO  ipratropium-albuterol (DUONEB) 0.5-2.5 (3) MG/3ML SOLN Take 3 mLs by nebulization every 6 (six) hours as needed. Patient taking differently: Take 3 mLs by nebulization every 6 (six) hours as needed (sob).  09/14/18   Tanda Rockers, MD    Physical Exam: Vitals:   10/25/2018 2205 10/27/2018 2209 10/16/2018 2256 11/07/2018 2300  BP: 101/60 105/60 102/67   Pulse: (!) 59 80 (!) 110   Resp: 15 (!) 22 20   Temp: 98.6 F (37 C) 98.6 F (37 C) 98.1 F (36.7 C)   TempSrc: Oral Oral Oral   SpO2: 94% 96% 98%   Weight:    90 kg  Height:    6\' 3"  (1.905 m)      Constitutional: Moderately built and nourished. Vitals:   11/01/2018 2205 11/01/2018 2209 10/18/2018 2256 10/22/2018 2300  BP: 101/60 105/60 102/67   Pulse: (!) 59 80 (!) 110   Resp: 15 (!) 22 20   Temp: 98.6 F (37 C) 98.6 F (37 C) 98.1 F (36.7 C)   TempSrc: Oral Oral Oral   SpO2: 94% 96% 98%   Weight:    90 kg  Height:    6\' 3"  (1.905 m)   Eyes: Anicteric no pallor. ENMT: No discharge from the ears eyes nose and mouth. Neck: No mass or.  No neck rigidity but no JVD appreciated. Respiratory: No rhonchi or crepitations. Cardiovascular: S1-S2 heard. Abdomen: Soft nontender bowel sounds present. Musculoskeletal: No edema. Skin: No rash. Neurologic: Alert awake oriented to time place and person.  Moves all extremities. Psychiatric: Appears normal per normal affect.   Labs on Admission: I have personally reviewed following labs and imaging studies  CBC: Recent Labs  Lab  10/19/18 1610 11/09/2018 1641  WBC 12.5* 13.6*  NEUTROABS 9.9* 10.9*  HGB 10.0* 9.1*  HCT 33.0* 29.8*  MCV 85.9 86.9  PLT 284 253   Basic Metabolic Panel: Recent Labs  Lab 10/19/18 1610 10/22/2018 1641 10/30/2018 2008  NA 139 134*  --   K 4.0 >7.5* 4.2  CL 103 104  --   CO2 27 24  --   GLUCOSE 102* 91  --   BUN 23 24*  --   CREATININE 1.06 1.39*  --   CALCIUM 11.1* 10.5*  --    GFR: Estimated Creatinine Clearance: 50.7 mL/min (A) (by C-G formula based on SCr of 1.39 mg/dL (H)).  Liver Function Tests: Recent Labs  Lab 10/19/18 1610  AST 17  ALT 16  ALKPHOS 73  BILITOT 0.7  PROT 7.2  ALBUMIN 2.5*   No results for input(s): LIPASE, AMYLASE in the last 168 hours. No results for input(s): AMMONIA in the last 168 hours. Coagulation Profile: Recent Labs  Lab 10/21/2018 1641  INR 1.7*   Cardiac Enzymes: No results for input(s): CKTOTAL, CKMB, CKMBINDEX, TROPONINI in the last 168 hours. BNP (last 3 results) No results for input(s): PROBNP in the last 8760 hours. HbA1C: No results for input(s): HGBA1C in the last 72 hours. CBG: No results for input(s): GLUCAP in the last 168 hours. Lipid Profile: No results for input(s): CHOL, HDL, LDLCALC, TRIG, CHOLHDL, LDLDIRECT in the last 72 hours. Thyroid Function Tests: No results for input(s): TSH, T4TOTAL, FREET4, T3FREE, THYROIDAB in the last 72 hours. Anemia Panel: No results for input(s): VITAMINB12, FOLATE, FERRITIN, TIBC, IRON, RETICCTPCT in the last 72 hours. Urine analysis:    Component Value Date/Time   COLORURINE AMBER (A) 10/19/2018 1700   APPEARANCEUR CLOUDY (A) 10/19/2018 1700   LABSPEC 1.018 10/19/2018 1700   PHURINE 5.0 10/19/2018 1700   GLUCOSEU NEGATIVE 10/19/2018 1700   HGBUR NEGATIVE 10/19/2018 1700   BILIRUBINUR NEGATIVE 10/19/2018 1700   KETONESUR 5 (A) 10/19/2018 1700   PROTEINUR 30 (A) 10/19/2018 1700   UROBILINOGEN 0.2 07/14/2007 1105   NITRITE NEGATIVE 10/19/2018 1700   LEUKOCYTESUR LARGE (A)  10/19/2018 1700   Sepsis Labs: @LABRCNTIP (procalcitonin:4,lacticidven:4) ) Recent Results (from the past 240 hour(s))  Blood Culture (routine x 2)     Status: None (Preliminary result)   Collection Time: 10/19/18  4:36 PM  Result Value Ref Range Status   Specimen Description BLOOD RIGHT ANTECUBITAL  Final   Special Requests   Final    BOTTLES DRAWN AEROBIC AND ANAEROBIC Blood Culture results may not be optimal due to an excessive volume of blood received in culture bottles   Culture   Final    NO GROWTH 4 DAYS Performed at [facility], [place] [address] [place], [place] [address]    Report Status PENDING  Incomplete  Urine culture     Status: Abnormal   Collection Time: 10/19/18  5:15 PM  Result Value Ref Range Status   Specimen Description URINE, CLEAN CATCH  Final   Special Requests   Final    NONE Performed at [facility], [place] [address], [place],  [address]    Culture (A)  Final    >=100,000 COLONIES/mL PSEUDOMONAS AERUGINOSA >=100,000 COLONIES/mL ENTEROCOCCUS FAECALIS    Report Status 10/22/2018 FINAL  Final   Organism ID, Bacteria PSEUDOMONAS AERUGINOSA (A)  Final   Organism ID, Bacteria ENTEROCOCCUS FAECALIS (A)  Final      Susceptibility   Enterococcus faecalis - MIC*    AMPICILLIN <=2 SENSITIVE Sensitive     LEVOFLOXACIN 1 SENSITIVE Sensitive     NITROFURANTOIN <=16 SENSITIVE Sensitive     VANCOMYCIN 1 SENSITIVE Sensitive     * >=100,000 COLONIES/mL ENTEROCOCCUS FAECALIS   Pseudomonas aeruginosa - MIC*    CEFTAZIDIME 4 SENSITIVE Sensitive     CIPROFLOXACIN <=0.25 SENSITIVE Sensitive     GENTAMICIN <=1 SENSITIVE Sensitive     IMIPENEM 1 SENSITIVE Sensitive     PIP/TAZO 8 SENSITIVE Sensitive     CEFEPIME 2 SENSITIVE Sensitive     * >=100,000 COLONIES/mL PSEUDOMONAS AERUGINOSA  SARS Coronavirus 2 (CEPHEID- Performed in [place] hospital lab), [facility]  Status: None   Collection Time: 11/07/2018  8:03 PM  Result Value Ref Range Status    SARS Coronavirus 2 NEGATIVE NEGATIVE Final    Comment: (NOTE) If result is NEGATIVE SARS-CoV-2 target nucleic acids are NOT DETECTED. The SARS-CoV-2 RNA is generally detectable in upper and lower  respiratory specimens during the acute phase of infection. The lowest  concentration of SARS-CoV-2 viral copies this assay can detect is 250  copies / mL. A negative result does not preclude SARS-CoV-2 infection  and should not be used as the sole basis for treatment or other  patient management decisions.  A negative result may occur with  improper specimen collection / handling, submission of specimen other  than nasopharyngeal swab, presence of viral mutation(s) within the  areas targeted by this assay, and inadequate number of viral copies  (<250 copies / mL). A negative result must be combined with clinical  observations, patient history, and epidemiological information. If result is POSITIVE SARS-CoV-2 target nucleic acids are DETECTED. The SARS-CoV-2 RNA is generally detectable in upper and lower  respiratory specimens dur ing the acute phase of infection.  Positive  results are indicative of active infection with SARS-CoV-2.  Clinical  correlation with patient history and other diagnostic information is  necessary to determine patient infection status.  Positive results do  not rule out bacterial infection or co-infection with other viruses. If result is PRESUMPTIVE POSTIVE SARS-CoV-2 nucleic acids MAY BE PRESENT.   A presumptive positive result was obtained on the submitted specimen  and confirmed on repeat testing.  While 2019 novel coronavirus  (SARS-CoV-2) nucleic acids may be present in the submitted sample  additional confirmatory testing may be necessary for epidemiological  and / or clinical management purposes  to differentiate between  SARS-CoV-2 and other Sarbecovirus currently known to infect humans.  If clinically indicated additional testing with an alternate test   methodology [phone]) is advised. The SARS-CoV-2 RNA is generally  detectable in upper and lower respiratory sp ecimens during the acute  phase of infection. The expected result is Negative. Fact Sheet for Patients:  StrictlyIdeas.no Fact Sheet for Healthcare Providers: BankingDealers.co.za This test is not yet approved or cleared by the [place] FDA and has been authorized for detection and/or diagnosis of SARS-CoV-2 by FDA under an Emergency Use Authorization (EUA).  This EUA will remain in effect (meaning this test can be used) for the duration of the COVID-19 declaration under Section 564(b)(1) of the Act, 21 U.S.C. section 360bbb-3(b)(1), unless the authorization is terminated or revoked sooner. Performed at [facility], [place] [address] [place], [place] [address]      Radiological Exams on Admission: Ct Chest Wo Contrast  Result Date: 10/25/2018 CLINICAL DATA:  Hemoptysis. Lung carcinoma. EXAM: CT CHEST WITHOUT CONTRAST TECHNIQUE: Multidetector CT imaging of the chest was performed following the standard protocol without IV contrast. COMPARISON:  09/02/2018 FINDINGS: Cardiovascular: Heart size normal. No pericardial effusion. Mild calcified aortic plaque. Dilated proximal descending thoracic segment 3.8 cm diameter, stable since previous. Mediastinum/Nodes: Confluent right hilar and subcarinal mass, with progressive narrowing of distal right mainstem bronchus, right upper lobe bronchus and bronchus intermedius. Lungs/Pleura: Bilateral partially calcified pleural plaques. Cavitary mass in the posterior right lower lobe with fluid level as before. Progressive consolidation throughout basilar segments of the right lower lobe. Upper Abdomen: 2.9 cm enlarging left adrenal mass. No acute findings. Musculoskeletal: No fracture or worrisome bone lesion. IMPRESSION: 1. Worsening right hilar and subcarinal mass with progressive  narrowing  of the distal right mainstem bronchus. 2. Progressive consolidation throughout basilar segments of the right lower lobe. 3. Enlarging 2.9 cm left adrenal mass, probable metastatic lesion. Electronically Signed   By: Lucrezia Europe M.D.   On: 10/25/2018 18:49   Dg Chest Port 1 View  Result Date: 11/12/2018 CLINICAL DATA:  Family stated patient started coughing up blood yesterday. Bright red and no clots. Patient has stage 3 lung cancer. Patient is on antibiotic for pneumonia. Patient has been tested twice for Covid and both negative. H/o Diabetes, CHF, CAD. Former smoker. EXAM: PORTABLE CHEST - 1 VIEW COMPARISON:  10/19/2018 FINDINGS: Persistent small right effusion with adjacent opacity at the right lung base. Stable coarse interstitial opacities in the left lower lung. Partially calcified left pleural plaques. No pneumothorax. Heart size and mediastinal contours are within normal limits. Visualized bones unremarkable. IMPRESSION: Persistent small right pleural effusion and adjacent right lower lung atelectasis/consolidation. Electronically Signed   By: Lucrezia Europe M.D.   On: 10/22/2018 17:42    EKG: Independently reviewed.  A. fib with RVR.  Assessment/Plan Principal Problem:   Hemoptysis Active Problems:   CAD s/p left circumflex coronary stents 2002   Essential hypertension   CKD (chronic kidney disease) stage 3, GFR 30-59 ml/min (HCC)   A-fib (HCC)   Anemia, chronic disease    1. Hemoptysis.  Patient be on apixaban with worsening consolidation and worsening and progressive lung mass with recent diagnosis of squamous cell carcinoma of the lung -after discussing with patient (addendum and patient's wife later) agreed for holding of apixaban with known risk of stroke.  Will closely monitor CBC.  Type and screen.  Transfuse if hemoglobin less than 7.  On empiric antibiotics will consult pulmonologist. 2. Worsening consolidation we will keep patient on empiric antibiotics given the history of  necrotizing pneumonia. 3. A. fib with RVR heart rate improved without any intervention 204/min.  Patient is on Cardizem.  As discussed in #1 patient's apixaban will be held because of the ongoing hemoptysis.  Patient agrees to a trip knowing the risk factor of stroke. 4. Chronic kidney disease stage III creatinine appears to be at baseline. 5. Chronic diastolic CHF closely monitor respiratory status. 6. Anemia secondary to chronic kidney disease iron deficiency and possible blood loss.  Follow CBC.  On iron supplements. 7. Hypercalcemia likely from squama cell carcinoma.  Closely follow metabolic panel.  8. Hypertension on Cardizem. 9. Recent fall with difficulty ambulating as discussed with patient's wife see addendum.  Will get x-rays of the hip and knee. 10. Sore on the sacral area will get wound team consult. 11. History of CAD will monitor cardiac markers.  Note that patient's initial potassium was high but repeat one was normal.  For now I am holding patient's potassium replacement until we repeat another metabolic panel to ensure.  No EKG changes with regarding to hyperkalemia.  Addendum -discussed with patient's wife.  Per patient's wife patient has been not able to ambulate well rule out for he had a fall 3 weeks ago on the recliner.  Did not lose consciousness.  He has been weak.  Has been coughing up a lot of phlegm last few days and last 2 days was having frank blood in the sputum.  Patient wife also stated that a small sore developing on the sacral area.  Will get wound team consult.    DVT prophylaxis: SCDs. Code Status: Full code.  Confirmed with patient. Family Communication: Patient's wife. Disposition Plan: Home. Consults  called: None. Admission status: Observation.   Rise Patience MD [facility] Pager [phone].  If 7PM-7AM, please contact night-coverage [email or web address] Password [facility]  10/28/2018, 11:22 PM

## 2018-10-23 NOTE — Telephone Encounter (Signed)
Primary Pulmonologist: MW Last office visit and with whom: 09/14/2018 with MW What do we see them for (pulmonary problems): necrotizing pna Last OV assessment/plan: Assessment & Plan Note by Tanda Rockers, MD at 09/15/2018 11:29 AM  Author: Tanda Rockers, MD Author Type: Physician Filed: 09/15/2018 11:30 AM  Note Status: Written Cosign: Cosign Not Required Encounter Date: 09/14/2018  Problem: Essential hypertension  Editor: Tanda Rockers, MD (Physician)    Over treated at this point and at risk of AKI when not taking po so rec d/c ARB and just use lasix prn excess leg swelling    I had an extended discussion with the patient/wife reviewing all relevant studies completed to date and  lasting 15 to 20 minutes of a 25 minute visit    See device teaching which extended face to face time for this visit.  Each maintenance medication was reviewed in detail including emphasizing most importantly the difference between maintenance and prns and under what circumstances the prns are to be triggered using an action plan format that is not reflected in the computer generated alphabetically organized AVS which I have not found useful in most complex patients, especially with respiratory illnesses  Please see AVS for specific instructions unique to this visit that I personally wrote and verbalized to the the pt in detail and then reviewed with pt  by my nurse highlighting any  changes in therapy recommended at today's visit to their plan of care.       Assessment & Plan Note by Tanda Rockers, MD at 09/15/2018 11:29 AM  Author: Tanda Rockers, MD Author Type: Physician Filed: 09/15/2018 11:29 AM  Note Status: Written Cosign: Cosign Not Required Encounter Date: 09/14/2018  Problem: DOE (dyspnea on exertion)  Editor: Tanda Rockers, MD (Physician)    Onset Oct 2019  - Spirometry 07/17/2018  FEV1 1.5 (47%)  Ratio 0.89 s curvature off all rx  - 07/17/2018   Walked RA  2 laps @  approx 267ft each @ avg pace   stopped due to end of study, no sob with sats 99% at end  - 09/14/2018  After extensive coaching inhaler device,  effectiveness =    0% so change to duoneb prn but this really isn't copd related sob       Assessment & Plan Note by Tanda Rockers, MD at 09/15/2018 11:28 AM  Author: Tanda Rockers, MD Author Type: Physician Filed: 09/15/2018 11:28 AM  Note Status: Written Cosign: Cosign Not Required Encounter Date: 09/14/2018  Problem: Necrotizing pneumonia Roger Blackwell)  Editor: Tanda Rockers, MD (Physician)    ? Onset of symptoms oct 2019  - FOB  05/23/2018  Nl flora, no malignancy :  afb smears neg/ fungus studies ordered but not done  - PET 07/11/18 1. Relatively similar appearance of the right lower lobe compared to 05/18/2018. Linear hypermetabolism corresponding to consolidation with immediately subjacent lung necrosis and developing pulmonary abscess. Presumably secondary small right hydropneumothorax suggestive of bronchopleural fistula. This process is favored to be infectious. Consider atypical pneumonia or chronic aspiration. 2. Persistent right lower lobe hypermetabolic mass with central necrosis or developing cavitation. This is indeterminate and could be infectious/inflammatory or neoplastic>   Referred to T surgery > Dr Lawson Fiscal rec IR bx done 08/23/2018 dx sq cell ca > not operable ? Stage III > referred to oncology  Nothing else to offer thru this clinic once established with oncology - f/u can be prn     Patient  Instructions by Tanda Rockers, MD at 09/14/2018 3:30 PM  Author: Tanda Rockers, MD Author Type: Physician Filed: 09/14/2018 3:59 PM  Note Status: Addendum Cosign: Cosign Not Required Encounter Date: 09/14/2018  Editor: Tanda Rockers, MD (Physician)  Prior Versions: 1. Tanda Rockers, MD (Physician) at 09/14/2018 3:56 PM - Signed    Stop losartan and maybe hold lasix(furosemide)  if no excess swelling in legs   Sleep in recliner for now  Change over to nebulizer  up every 6 hours if needed   Pulmonary follow up is as needed      Was appointment offered to patient (explain)?  Pt and wife Roger Blackwell want recommendations   Reason for call: Called pt's wife Roger Blackwell in regards to pt coughing up the blood and while speaking with Roger Blackwell, she stated that pt has constantly been coughing up blood and it is a lot of blood. Due to this, she is going to call EMS. Nothing further needed.  (examples of things to ask: : When did symptoms start? Fever? Cough? Productive? Color to sputum? More sputum than usual? Wheezing? Have you needed increased oxygen? Are you taking your respiratory medications? What over the counter measures have you tried?)

## 2018-10-23 NOTE — ED Notes (Signed)
ED TO INPATIENT HANDOFF REPORT  ED Nurse Name and Phone #: jon wled   S Name/Age/Gender Roger Blackwell 83 y.o. male Room/Bed: WA03/WA03  Code Status   Code Status: Prior  Home/SNF/Other Home Patient oriented to: self, place, time and situation Is this baseline? Yes   Triage Complete: Triage complete  Chief Complaint Coughing up blood  Triage Note Patient arrived via [facility] from home.   Family stated patient started coughing up blood yesterday. Bright red and no clots.   Patient has stage 3 lung cancer.   Patient fell last week and was seen in ED per family.   Patient is on antibiotic for pneumonia.   Patient has been tested twice for Covid and both negative.   Full Code.    A/Ox4  Per ems patient is 94% on room air. Patient will decrease with activity to low 90s but not home 02 prescribed.   Family would like to be called and updated.    Allergies Allergies  Allergen Reactions  . Codeine Nausea And Vomiting and Other (See Comments)    Made the patient feel badly, also  . Diltiazem Nausea Only    Hallucinations, memory loss  . Ivp Dye [Iodinated Diagnostic Agents] Other (See Comments)    Blisters all over the body    Level of Care/Admitting Diagnosis ED Disposition    ED Disposition Condition [facility] Area: [place] [100102]  Level of Care: Telemetry [5]  Admit to tele based on following criteria: Monitor for Ischemic changes  Covid Evaluation: Screening Protocol (No Symptoms)  Diagnosis: Hemoptysis [329518]  Admitting Physician: Rise Patience [phone]  Attending Physician: Rise Patience [email or web address]  PT Class (Do Not Modify): Observation [104]  PT Acc Code (Do Not Modify): Observation [10022]       B Medical/Surgery History Past Medical History:  Diagnosis Date  . CAD (coronary artery disease)   . CHF (congestive heart failure) ([place])   . DM (diabetes mellitus) ([place])   . Dyslipidemia   . Gout    . Morbid obesity ([place])   . Systemic hypertension    Past Surgical History:  Procedure Laterality Date  . BACK SURGERY    . CORONARY ANGIOPLASTY WITH STENT PLACEMENT  12/26/2000   80% prox. circumflex lesion w/successful stent placement.  Marland Kitchen NM MYOVIEW LTD  08/31/2010   Normal  . PERICARDIOCENTESIS    . PROSTATE SURGERY    . US ECHOCARDIOGRAPHY  08/31/2010   EF 50-55%,RV mildly dilated,mild aortic root dilatation  . VIDEO BRONCHOSCOPY Bilateral 05/23/2018   Procedure: VIDEO BRONCHOSCOPY WITHOUT FLUORO;  Surgeon: Marshell Garfinkel, MD;  Location: [facility];  Service: Cardiopulmonary;  Laterality: Bilateral;     A IV Location/Drains/Wounds Patient Lines/Drains/Airways Status   Active Line/Drains/Airways    Name:   Placement date:   Placement time:   Site:   Days:   Peripheral IV 10/17/2018 Right Antecubital   10/24/2018    1727    Antecubital   less than 1   Incision (Closed) 08/23/18 Back Right;Mid   08/23/18    1147     61          Intake/Output Last 24 hours No intake or output data in the 24 hours ending 11/07/2018 2207  Labs/Imaging Results for orders placed or performed during the hospital encounter of 11/09/2018 (from the past 48 hour(s))  CBC with Differential     Status: Abnormal   Collection Time: 11/10/2018  4:41 PM  Result  Value Ref Range   WBC 13.6 (H) 4.0 - 10.5 K/uL   RBC 3.43 (L) 4.22 - 5.81 MIL/uL   Hemoglobin 9.1 (L) 13.0 - 17.0 g/dL   HCT 29.8 (L) 39.0 - 52.0 %   MCV 86.9 80.0 - 100.0 fL   MCH 26.5 26.0 - 34.0 pg   MCHC 30.5 30.0 - 36.0 g/dL   RDW 16.5 (H) 11.5 - 15.5 %   Platelets 298 150 - 400 K/uL   nRBC 0.1 0.0 - 0.2 %   Neutrophils Relative % 80 %   Neutro Abs 10.9 (H) 1.7 - 7.7 K/uL   Lymphocytes Relative 9 %   Lymphs Abs 1.2 0.7 - 4.0 K/uL   Monocytes Relative 9 %   Monocytes Absolute 1.2 (H) 0.1 - 1.0 K/uL   Eosinophils Relative 2 %   Eosinophils Absolute 0.3 0.0 - 0.5 K/uL   Basophils Relative 0 %   Basophils Absolute 0.0 0.0 - 0.1 K/uL   Immature  Granulocytes 0 %   Abs Immature Granulocytes 0.06 0.00 - 0.07 K/uL    Comment: Performed at [facility], [place] [address], [place], [place] [address]  Protime-INR     Status: Abnormal   Collection Time: 10/28/2018  4:41 PM  Result Value Ref Range   Prothrombin Time 19.5 (H) 11.4 - 15.2 seconds   INR 1.7 (H) 0.8 - 1.2    Comment: (NOTE) INR goal varies based on device and disease states. Performed at [facility], [place] [address] [place], [place] [address]   Basic metabolic panel     Status: Abnormal   Collection Time: 11/08/2018  4:41 PM  Result Value Ref Range   Sodium 134 (L) 135 - 145 mmol/L   Potassium >7.5 (HH) 3.5 - 5.1 mmol/L    Comment: MODERATE HEMOLYSIS CRITICAL RESULT CALLED TO, READ BACK BY AND VERIFIED WITH: HALL,C. RN @1822  ON 06.08.2020 BY COHEN,K CORRECTED RESULTS CALLED TO: BRILL,M. RN @1840  ON 06.08.2020 BY COHEN,K CORRECTED ON 06/08 AT 1840: PREVIOUSLY REPORTED AS 11.7 MODERATE HEMOLYSIS CRITICAL RESULT CALLED TO, READ BACK BY AND VERIFIED WITH: HALL,C. RN @1822  ON 06.08.2020 BY COHEN,K    Chloride 104 98 - 111 mmol/L   CO2 24 22 - 32 mmol/L   Glucose, Bld 91 70 - 99 mg/dL   BUN 24 (H) 8 - 23 mg/dL   Creatinine, Ser 1.39 (H) 0.61 - 1.24 mg/dL   Calcium 10.5 (H) 8.9 - 10.3 mg/dL   GFR calc non Af Amer 48 (L) >60 mL/min   GFR calc Af Amer 55 (L) >60 mL/min   Anion gap 6 5 - 15    Comment: Performed at [facility], [place] [address], [place], [place] [address]  Type and screen [place]     Status: None   Collection Time: 10/27/2018  4:42 PM  Result Value Ref Range   ABO/RH(D) A NEG    Antibody Screen NEG    Sample Expiration      10/26/2018,2359 Performed at [facility], [place] [address], [place], [place] [address]   ABO/Rh     Status: None (Preliminary result)   Collection Time: 10/27/2018  5:27 PM  Result Value Ref Range   ABO/RH(D)      A NEG Performed at [facility], [place] [address], [place],  [address]   SARS Coronavirus 2 (CEPHEID- Performed in [place] hospital lab), [facility] Order     Status: None  Collection Time: 11/09/2018  8:03 PM  Result Value Ref Range   SARS Coronavirus 2 NEGATIVE NEGATIVE    Comment: (NOTE) If result is NEGATIVE SARS-CoV-2 target nucleic acids are NOT DETECTED. The SARS-CoV-2 RNA is generally detectable in upper and lower  respiratory specimens during the acute phase of infection. The lowest  concentration of SARS-CoV-2 viral copies this assay can detect is 250  copies / mL. A negative result does not preclude SARS-CoV-2 infection  and should not be used as the sole basis for treatment or other  patient management decisions.  A negative result may occur with  improper specimen collection / handling, submission of specimen other  than nasopharyngeal swab, presence of viral mutation(s) within the  areas targeted by this assay, and inadequate number of viral copies  (<250 copies / mL). A negative result must be combined with clinical  observations, patient history, and epidemiological information. If result is POSITIVE SARS-CoV-2 target nucleic acids are DETECTED. The SARS-CoV-2 RNA is generally detectable in upper and lower  respiratory specimens dur ing the acute phase of infection.  Positive  results are indicative of active infection with SARS-CoV-2.  Clinical  correlation with patient history and other diagnostic information is  necessary to determine patient infection status.  Positive results do  not rule out bacterial infection or co-infection with other viruses. If result is PRESUMPTIVE POSTIVE SARS-CoV-2 nucleic acids MAY BE PRESENT.   A presumptive positive result was obtained on the submitted specimen  and confirmed on repeat testing.  While 2019 novel coronavirus  (SARS-CoV-2) nucleic acids may be present in the submitted sample  additional confirmatory testing may be necessary  for epidemiological  and / or clinical management purposes  to differentiate between  SARS-CoV-2 and other Sarbecovirus currently known to infect humans.  If clinically indicated additional testing with an alternate test  methodology [phone]) is advised. The SARS-CoV-2 RNA is generally  detectable in upper and lower respiratory sp ecimens during the acute  phase of infection. The expected result is Negative. Fact Sheet for Patients:  StrictlyIdeas.no Fact Sheet for Healthcare Providers: BankingDealers.co.za This test is not yet approved or cleared by the [place] FDA and has been authorized for detection and/or diagnosis of SARS-CoV-2 by FDA under an Emergency Use Authorization (EUA).  This EUA will remain in effect (meaning this test can be used) for the duration of the COVID-19 declaration under Section 564(b)(1) of the Act, 21 U.S.C. section 360bbb-3(b)(1), unless the authorization is terminated or revoked sooner. Performed at [facility], [place] [address], [place], [place] [address]   Potassium     Status: None   Collection Time: 10/25/2018  8:08 PM  Result Value Ref Range   Potassium 4.2 3.5 - 5.1 mmol/L    Comment: NO VISIBLE HEMOLYSIS Performed at [place] [address] [place], [place] [address]    Ct Chest Wo Contrast  Result Date: 11/13/2018 CLINICAL DATA:  Hemoptysis. Lung carcinoma. EXAM: CT CHEST WITHOUT CONTRAST TECHNIQUE: Multidetector CT imaging of the chest was performed following the standard protocol without IV contrast. COMPARISON:  09/02/2018 FINDINGS: Cardiovascular: Heart size normal. No pericardial effusion. Mild calcified aortic plaque. Dilated proximal descending thoracic segment 3.8 cm diameter, stable since previous. Mediastinum/Nodes: Confluent right hilar and subcarinal mass, with progressive narrowing of distal right mainstem bronchus, right upper lobe bronchus  and bronchus intermedius. Lungs/Pleura: Bilateral partially calcified pleural plaques. Cavitary mass in the posterior right lower lobe with fluid level as before. Progressive consolidation  throughout basilar segments of the right lower lobe. Upper Abdomen: 2.9 cm enlarging left adrenal mass. No acute findings. Musculoskeletal: No fracture or worrisome bone lesion. IMPRESSION: 1. Worsening right hilar and subcarinal mass with progressive narrowing of the distal right mainstem bronchus. 2. Progressive consolidation throughout basilar segments of the right lower lobe. 3. Enlarging 2.9 cm left adrenal mass, probable metastatic lesion. Electronically Signed   By: Lucrezia Europe M.D.   On: 11/02/2018 18:49   Dg Chest Port 1 View  Result Date: 10/30/2018 CLINICAL DATA:  Family stated patient started coughing up blood yesterday. Bright red and no clots. Patient has stage 3 lung cancer. Patient is on antibiotic for pneumonia. Patient has been tested twice for Covid and both negative. H/o Diabetes, CHF, CAD. Former smoker. EXAM: PORTABLE CHEST - 1 VIEW COMPARISON:  10/19/2018 FINDINGS: Persistent small right effusion with adjacent opacity at the right lung base. Stable coarse interstitial opacities in the left lower lung. Partially calcified left pleural plaques. No pneumothorax. Heart size and mediastinal contours are within normal limits. Visualized bones unremarkable. IMPRESSION: Persistent small right pleural effusion and adjacent right lower lung atelectasis/consolidation. Electronically Signed   By: Lucrezia Europe M.D.   On: 10/22/2018 17:42    Pending Labs Unresulted Labs (From admission, onward)   None      Vitals/Pain Today's Vitals   11/14/2018 1930 10/16/2018 1931 11/05/2018 2000 10/21/2018 2205  BP:  102/66 (!) 103/55 101/60  Pulse:  (!) 103 (!) 53 (!) 59  Resp:  (!) 22 16 15   Temp:  98.6 F (37 C)  98.6 F (37 C)  TempSrc:  Oral  Oral  SpO2: 95% 96% 95% 94%  PainSc:    Asleep    Isolation  Precautions Droplet and Contact precautions  Medications Medications - No data to display  Mobility walks with person assists   Focused Assessments Pulmonary assessment    R Recommendations: See Admitting Provider Note  Report given to:   Additional Notes:

## 2018-10-23 NOTE — ED Triage Notes (Addendum)
Patient arrived via [facility] from home.   Family stated patient started coughing up blood yesterday. Bright red and no clots.   Patient has stage 3 lung cancer.   Patient fell last week and was seen in ED per family.   Patient is on antibiotic for pneumonia.   Patient has been tested twice for Covid and both negative.   Full Code.    A/Ox4  Per ems patient is 94% on room air. Patient will decrease with activity to low 90s but not home 02 prescribed.   Family would like to be called and updated.

## 2018-10-23 NOTE — Progress Notes (Addendum)
Pharmacy Antibiotic Note  Roger Blackwell is a 83 y.o. male admitted on 11/13/2018 with pneumonia.  Pharmacy has been consulted for Zosyn dosing. ED visit 6/4 for slurred speech/facial droop, tx for UTI & PNA with LVQ, concern for QTc Vancomycin added  Plan: Zosyn 3.375g IV q8h (4 hour infusion).  Vancomycin 1750mg  x1, then 1500mg  q24 hr for AUC 498, SCr 1.39 Daily SCr  Height: 6\' 3"  (190.5 cm) Weight: 198 lb 6.6 oz (90 kg) IBW/kg (Calculated) : 84.5  Temp (24hrs), Avg:98.4 F (36.9 C), Min:98 F (36.7 C), Max:98.6 F (37 C)  Recent Labs  Lab 10/19/18 1610 11/10/2018 1641  WBC 12.5* 13.6*  CREATININE 1.06 1.39*    Estimated Creatinine Clearance: 50.7 mL/min (A) (by C-G formula based on SCr of 1.39 mg/dL (H)).    Allergies  Allergen Reactions  . Codeine Nausea And Vomiting and Other (See Comments)    Made the patient feel badly, also  . Diltiazem Nausea Only    Hallucinations, memory loss  . Ivp Dye [Iodinated Diagnostic Agents] Other (See Comments)    Blisters all over the body    Antimicrobials this admission: 6/8  Zosyn >>  6/7 Vanc >> PTA  6/4 Levaquin >> 6/7  Dose adjustments this admission:  Microbiology results: 6/8 Covid: neg 6/4 BCx: ngtd 6/4 UCx: Pseudomonas & Enterococcus (both pan-sens)  Thank you for allowing pharmacy to be a part of this patient's care.  Minda Ditto PharmD Pager [phone] 11/11/2018, 11:43 PM

## 2018-10-23 NOTE — ED Provider Notes (Signed)
[facility] DEPT Provider Note   CSN: [id number] Arrival date & time: 10/20/2018  1537    History   Chief Complaint Chief Complaint  Patient presents with  . Hemoptysis    HPI Roger Blackwell is a 83 y.o. male.     Patient has squamous cell carcinoma of the lung.  He started coughing up blood yesterday.  He also takes Eliquis for coronary artery disease  The history is provided by the patient. No language interpreter was used.  Cough  Cough characteristics:  Productive Sputum characteristics:  Bloody Severity:  Moderate Onset quality:  Sudden Timing:  Constant Progression:  Worsening Chronicity:  New Smoker: yes   Context: not animal exposure   Relieved by:  Nothing Worsened by:  Nothing Associated symptoms: no chest pain, no eye discharge, no headaches and no rash     Past Medical History:  Diagnosis Date  . CAD (coronary artery disease)   . CHF (congestive heart failure) ([place])   . DM (diabetes mellitus) ([place])   . Dyslipidemia   . Gout   . Morbid obesity ([place])   . Systemic hypertension     Patient Active Problem List   Diagnosis Date Noted  . A-fib ([place]) 10/02/2018  . Primary cancer of right lower lobe of lung ([place]) 10/02/2018  . Atrial fibrillation with RVR ([place]) 09/02/2018  . DOE (dyspnea on exertion) 07/17/2018  . Necrotizing pneumonia ([place]) 06/15/2018  . S/P bronchoscopy with biopsy   . Malnutrition of moderate degree 05/21/2018  . Hemoptysis 05/20/2018  . Weight loss   . Cough with hemoptysis 05/19/2018  . Acute diastolic CHF (congestive heart failure) ([place]) 05/19/2018  . Gout 05/19/2018  . Community acquired pneumonia of right lung   . CKD (chronic kidney disease) stage 3, GFR 30-59 ml/min ([facility]) 03/19/2018  . Iron deficiency anemia 08/10/2017  . Dyslipidemia 12/30/2014  . CAD s/p left circumflex coronary stents 2002 05/25/2013  . Class 2 severe obesity due to excess calories with serious comorbidity and body mass  index (BMI) of 36.0 to 36.9 in adult ([place]) 05/25/2013  . Chronic diastolic heart failure ([place]) 05/25/2013  . Hypercholesterolemia 05/25/2013  . Essential hypertension 05/25/2013  . Glaucoma 05/25/2013  . Type 2 diabetes mellitus with stage 3 chronic kidney disease, with long-term current use of insulin ([place]) 05/25/2013    Past Surgical History:  Procedure Laterality Date  . BACK SURGERY    . CORONARY ANGIOPLASTY WITH STENT PLACEMENT  12/26/2000   80% prox. circumflex lesion w/successful stent placement.  Marland Kitchen NM MYOVIEW LTD  08/31/2010   Normal  . PERICARDIOCENTESIS    . PROSTATE SURGERY    . US ECHOCARDIOGRAPHY  08/31/2010   EF 50-55%,RV mildly dilated,mild aortic root dilatation  . VIDEO BRONCHOSCOPY Bilateral 05/23/2018   Procedure: VIDEO BRONCHOSCOPY WITHOUT FLUORO;  Surgeon: Marshell Garfinkel, MD;  Location: [facility]COPY;  Service: Cardiopulmonary;  Laterality: Bilateral;        Home Medications    Prior to Admission medications   Medication Sig Start Date End Date Taking? Authorizing Provider  albuterol (PROVENTIL HFA;VENTOLIN HFA) 108 (90 Base) MCG/ACT inhaler Inhale 2 puffs into the lungs every 6 (six) hours as needed for wheezing or shortness of breath. 08/30/18  Yes Ivin Poot, MD  allopurinol (ZYLOPRIM) 100 MG tablet Take 200 mg by mouth daily.    Yes [provider]  apixaban (ELIQUIS) 5 MG TABS tablet Take 1 tablet (5 mg total) by mouth 2 (two) times daily. 09/04/18  Yes  Roxan Hockey, MD  diltiazem (CARDIZEM CD) 120 MG 24 hr capsule Take 120 mg by mouth daily.  10/10/18  Yes [provider]  Ferrous Sulfate (IRON PO) Take 2 tablets by mouth every evening.   Yes [provider]  levofloxacin (LEVAQUIN) 500 MG tablet Take 1 tablet (500 mg total) by mouth daily for 6 days. 10/19/18 10/25/18 Yes Lorin Glass, PA-C  mirtazapine (REMERON) 15 MG tablet Take 1 tablet (15 mg total) by mouth at bedtime. 09/04/18 09/04/19 Yes Emokpae, Courage, MD   Potassium 99 MG TABS Take 99 mg by mouth daily.    Yes [provider]  Travoprost, BAK Free, (TRAVATAN) 0.004 % SOLN ophthalmic solution Place 1 drop into both eyes at bedtime.   Yes [provider]  diltiazem (CARDIZEM CD) 180 MG 24 hr capsule Take 1 capsule (180 mg total) by mouth daily for 30 days. Patient not taking: Reported on 11/07/2018 10/04/18 2018/11/22  Dessa Phi, DO  ipratropium-albuterol (DUONEB) 0.5-2.5 (3) MG/3ML SOLN Take 3 mLs by nebulization every 6 (six) hours as needed. Patient taking differently: Take 3 mLs by nebulization every 6 (six) hours as needed (sob).  09/14/18   Tanda Rockers, MD    Family History Family History  Problem Relation Age of Onset  . Diabetes Brother   . Hypertension Sister     Social History Social History   Tobacco Use  . Smoking status: Former Smoker    Types: Cigarettes    Last attempt to quit: 05/16/1993    Years since quitting: 25.4  . Smokeless tobacco: Never Used  Substance Use Topics  . Alcohol use: Yes    Comment: seldom  . Drug use: No     Allergies   Codeine; Diltiazem; and Ivp dye [iodinated diagnostic agents]   Review of Systems Review of Systems  Constitutional: Negative for appetite change and fatigue.  HENT: Negative for congestion, ear discharge and sinus pressure.   Eyes: Negative for discharge.  Respiratory: Positive for cough.        Coughing up blood  Cardiovascular: Negative for chest pain.  Gastrointestinal: Negative for abdominal pain and diarrhea.  Genitourinary: Negative for frequency and hematuria.  Musculoskeletal: Negative for back pain.  Skin: Negative for rash.  Neurological: Negative for seizures and headaches.  Psychiatric/Behavioral: Negative for hallucinations.     Physical Exam Updated Vital Signs BP 102/66 (BP Location: Left Arm)   Pulse (!) 103   Temp 98.6 F (37 C) (Oral)   Resp (!) 22   SpO2 96%   Physical Exam Vitals signs and nursing note reviewed.   Constitutional:      Appearance: He is well-developed.  HENT:     Head: Normocephalic.     Nose: Nose normal.  Eyes:     General: No scleral icterus.    Conjunctiva/sclera: Conjunctivae normal.  Neck:     Musculoskeletal: Neck supple.     Thyroid: No thyromegaly.  Cardiovascular:     Rate and Rhythm: Normal rate and regular rhythm.     Heart sounds: No murmur. No friction rub. No gallop.   Pulmonary:     Breath sounds: No stridor. No wheezing or rales.  Chest:     Chest wall: No tenderness.  Abdominal:     General: There is no distension.     Tenderness: There is no abdominal tenderness. There is no rebound.  Musculoskeletal: Normal range of motion.  Lymphadenopathy:     Cervical: No cervical adenopathy.  Skin:  Findings: No erythema or rash.  Neurological:     Mental Status: He is oriented to person, place, and time.     Motor: No abnormal muscle tone.     Coordination: Coordination normal.  Psychiatric:        Behavior: Behavior normal.      ED Treatments / Results  Labs (all labs ordered are listed, but only abnormal results are displayed) Labs Reviewed  CBC WITH DIFFERENTIAL/PLATELET - Abnormal; Notable for the following components:      Result Value   WBC 13.6 (*)    RBC 3.43 (*)    Hemoglobin 9.1 (*)    HCT 29.8 (*)    RDW 16.5 (*)    Neutro Abs 10.9 (*)    Monocytes Absolute 1.2 (*)    All other components within normal limits  PROTIME-INR - Abnormal; Notable for the following components:   Prothrombin Time 19.5 (*)    INR 1.7 (*)    All other components within normal limits  BASIC METABOLIC PANEL - Abnormal; Notable for the following components:   Sodium 134 (*)    Potassium >7.5 (*)    BUN 24 (*)    Creatinine, Ser 1.39 (*)    Calcium 10.5 (*)    GFR calc non Af Amer 48 (*)    GFR calc Af Amer 55 (*)    All other components within normal limits  SARS CORONAVIRUS 2 (HOSPITAL ORDER, [facility])  POTASSIUM  TYPE AND  SCREEN  ABO/RH    EKG EKG Interpretation  Date/Time:  Monday October 23 2018 16:14:25 EDT Ventricular Rate:  121 PR Interval:    QRS Duration: 123 QT Interval:  345 QTC Calculation: 490 R Axis:   32 Text Interpretation:  Atrial fibrillation Left bundle branch block Baseline wander in lead(s) V4 Confirmed by Milton Ferguson [phone]) on 11/07/2018 7:48:04 PM   Radiology Ct Chest Wo Contrast  Result Date: 10/26/2018 CLINICAL DATA:  Hemoptysis. Lung carcinoma. EXAM: CT CHEST WITHOUT CONTRAST TECHNIQUE: Multidetector CT imaging of the chest was performed following the standard protocol without IV contrast. COMPARISON:  09/02/2018 FINDINGS: Cardiovascular: Heart size normal. No pericardial effusion. Mild calcified aortic plaque. Dilated proximal descending thoracic segment 3.8 cm diameter, stable since previous. Mediastinum/Nodes: Confluent right hilar and subcarinal mass, with progressive narrowing of distal right mainstem bronchus, right upper lobe bronchus and bronchus intermedius. Lungs/Pleura: Bilateral partially calcified pleural plaques. Cavitary mass in the posterior right lower lobe with fluid level as before. Progressive consolidation throughout basilar segments of the right lower lobe. Upper Abdomen: 2.9 cm enlarging left adrenal mass. No acute findings. Musculoskeletal: No fracture or worrisome bone lesion. IMPRESSION: 1. Worsening right hilar and subcarinal mass with progressive narrowing of the distal right mainstem bronchus. 2. Progressive consolidation throughout basilar segments of the right lower lobe. 3. Enlarging 2.9 cm left adrenal mass, probable metastatic lesion. Electronically Signed   By: Lucrezia Europe M.D.   On: 11/10/2018 18:49   Dg Chest Port 1 View  Result Date: 10/29/2018 CLINICAL DATA:  Family stated patient started coughing up blood yesterday. Bright red and no clots. Patient has stage 3 lung cancer. Patient is on antibiotic for pneumonia. Patient has been tested twice for Covid  and both negative. H/o Diabetes, CHF, CAD. Former smoker. EXAM: PORTABLE CHEST - 1 VIEW COMPARISON:  10/19/2018 FINDINGS: Persistent small right effusion with adjacent opacity at the right lung base. Stable coarse interstitial opacities in the left lower lung. Partially calcified left  pleural plaques. No pneumothorax. Heart size and mediastinal contours are within normal limits. Visualized bones unremarkable. IMPRESSION: Persistent small right pleural effusion and adjacent right lower lung atelectasis/consolidation. Electronically Signed   By: Lucrezia Europe M.D.   On: 11/06/2018 17:42    Procedures Procedures (including critical care time)  Medications Ordered in ED Medications - No data to display   Initial Impression / Assessment and Plan / ED Course  I have reviewed the triage vital signs and the nursing notes.  Pertinent labs & imaging results that were available during my care of the patient were reviewed by me and considered in my medical decision making (see chart for details).        Patient with worsening squamous cell carcinoma of the lung.  He is having hemoptysis today and will be admitted to medicine  Final Clinical Impressions(s) / ED Diagnoses   Final diagnoses:  Hemoptysis    ED Discharge Orders    None       Milton Ferguson, MD 11/01/2018 2019

## 2018-10-24 ENCOUNTER — Observation Stay: Payer: Self-pay

## 2018-10-24 ENCOUNTER — Observation Stay (HOSPITAL_COMMUNITY): Payer: Medicare Other

## 2018-10-24 ENCOUNTER — Inpatient Hospital Stay (HOSPITAL_COMMUNITY): Payer: Medicare Other

## 2018-10-24 DIAGNOSIS — Z515 Encounter for palliative care: Secondary | ICD-10-CM | POA: Diagnosis not present

## 2018-10-24 DIAGNOSIS — I5032 Chronic diastolic (congestive) heart failure: Secondary | ICD-10-CM | POA: Diagnosis present

## 2018-10-24 DIAGNOSIS — J850 Gangrene and necrosis of lung: Secondary | ICD-10-CM

## 2018-10-24 DIAGNOSIS — I4891 Unspecified atrial fibrillation: Secondary | ICD-10-CM | POA: Diagnosis not present

## 2018-10-24 DIAGNOSIS — E46 Unspecified protein-calorie malnutrition: Secondary | ICD-10-CM | POA: Diagnosis present

## 2018-10-24 DIAGNOSIS — I130 Hypertensive heart and chronic kidney disease with heart failure and stage 1 through stage 4 chronic kidney disease, or unspecified chronic kidney disease: Secondary | ICD-10-CM | POA: Diagnosis present

## 2018-10-24 DIAGNOSIS — L89152 Pressure ulcer of sacral region, stage 2: Secondary | ICD-10-CM | POA: Diagnosis present

## 2018-10-24 DIAGNOSIS — N183 Chronic kidney disease, stage 3 (moderate): Secondary | ICD-10-CM | POA: Diagnosis not present

## 2018-10-24 DIAGNOSIS — I10 Essential (primary) hypertension: Secondary | ICD-10-CM | POA: Diagnosis not present

## 2018-10-24 DIAGNOSIS — C3490 Malignant neoplasm of unspecified part of unspecified bronchus or lung: Secondary | ICD-10-CM | POA: Diagnosis not present

## 2018-10-24 DIAGNOSIS — J929 Pleural plaque without asbestos: Secondary | ICD-10-CM | POA: Diagnosis not present

## 2018-10-24 DIAGNOSIS — D631 Anemia in chronic kidney disease: Secondary | ICD-10-CM | POA: Diagnosis present

## 2018-10-24 DIAGNOSIS — Z6824 Body mass index (BMI) 24.0-24.9, adult: Secondary | ICD-10-CM | POA: Diagnosis not present

## 2018-10-24 DIAGNOSIS — G9340 Encephalopathy, unspecified: Secondary | ICD-10-CM | POA: Diagnosis not present

## 2018-10-24 DIAGNOSIS — J189 Pneumonia, unspecified organism: Secondary | ICD-10-CM | POA: Diagnosis not present

## 2018-10-24 DIAGNOSIS — S3993XA Unspecified injury of pelvis, initial encounter: Secondary | ICD-10-CM | POA: Diagnosis not present

## 2018-10-24 DIAGNOSIS — C3431 Malignant neoplasm of lower lobe, right bronchus or lung: Secondary | ICD-10-CM | POA: Diagnosis not present

## 2018-10-24 DIAGNOSIS — I2510 Atherosclerotic heart disease of native coronary artery without angina pectoris: Secondary | ICD-10-CM | POA: Diagnosis not present

## 2018-10-24 DIAGNOSIS — E1122 Type 2 diabetes mellitus with diabetic chronic kidney disease: Secondary | ICD-10-CM | POA: Diagnosis present

## 2018-10-24 DIAGNOSIS — M160 Bilateral primary osteoarthritis of hip: Secondary | ICD-10-CM | POA: Diagnosis present

## 2018-10-24 DIAGNOSIS — D630 Anemia in neoplastic disease: Secondary | ICD-10-CM

## 2018-10-24 DIAGNOSIS — R042 Hemoptysis: Secondary | ICD-10-CM

## 2018-10-24 DIAGNOSIS — Z20828 Contact with and (suspected) exposure to other viral communicable diseases: Secondary | ICD-10-CM | POA: Diagnosis present

## 2018-10-24 DIAGNOSIS — R40243 Glasgow coma scale score 3-8, unspecified time: Secondary | ICD-10-CM | POA: Diagnosis not present

## 2018-10-24 DIAGNOSIS — R29810 Facial weakness: Secondary | ICD-10-CM | POA: Diagnosis present

## 2018-10-24 DIAGNOSIS — M25559 Pain in unspecified hip: Secondary | ICD-10-CM | POA: Diagnosis not present

## 2018-10-24 DIAGNOSIS — R51 Headache: Secondary | ICD-10-CM | POA: Diagnosis not present

## 2018-10-24 DIAGNOSIS — Z66 Do not resuscitate: Secondary | ICD-10-CM | POA: Diagnosis not present

## 2018-10-24 DIAGNOSIS — Z452 Encounter for adjustment and management of vascular access device: Secondary | ICD-10-CM | POA: Diagnosis not present

## 2018-10-24 DIAGNOSIS — Z8673 Personal history of transient ischemic attack (TIA), and cerebral infarction without residual deficits: Secondary | ICD-10-CM

## 2018-10-24 DIAGNOSIS — F17210 Nicotine dependence, cigarettes, uncomplicated: Secondary | ICD-10-CM | POA: Diagnosis present

## 2018-10-24 DIAGNOSIS — J440 Chronic obstructive pulmonary disease with acute lower respiratory infection: Secondary | ICD-10-CM | POA: Diagnosis present

## 2018-10-24 DIAGNOSIS — J9602 Acute respiratory failure with hypercapnia: Secondary | ICD-10-CM | POA: Diagnosis not present

## 2018-10-24 DIAGNOSIS — D638 Anemia in other chronic diseases classified elsewhere: Secondary | ICD-10-CM | POA: Diagnosis not present

## 2018-10-24 DIAGNOSIS — L89302 Pressure ulcer of unspecified buttock, stage 2: Secondary | ICD-10-CM | POA: Diagnosis not present

## 2018-10-24 DIAGNOSIS — M25562 Pain in left knee: Secondary | ICD-10-CM | POA: Diagnosis not present

## 2018-10-24 DIAGNOSIS — D500 Iron deficiency anemia secondary to blood loss (chronic): Secondary | ICD-10-CM | POA: Diagnosis present

## 2018-10-24 DIAGNOSIS — M1711 Unilateral primary osteoarthritis, right knee: Secondary | ICD-10-CM | POA: Diagnosis not present

## 2018-10-24 LAB — CBC
HCT: 29.6 % — ABNORMAL LOW (ref 39.0–52.0)
HCT: 29.6 % — ABNORMAL LOW (ref 39.0–52.0)
HCT: 29.8 % — ABNORMAL LOW (ref 39.0–52.0)
Hemoglobin: 8.9 g/dL — ABNORMAL LOW (ref 13.0–17.0)
Hemoglobin: 9 g/dL — ABNORMAL LOW (ref 13.0–17.0)
Hemoglobin: 9 g/dL — ABNORMAL LOW (ref 13.0–17.0)
MCH: 26.1 pg (ref 26.0–34.0)
MCH: 26.5 pg (ref 26.0–34.0)
MCH: 26.8 pg (ref 26.0–34.0)
MCHC: 29.9 g/dL — ABNORMAL LOW (ref 30.0–36.0)
MCHC: 30.4 g/dL (ref 30.0–36.0)
MCHC: 30.4 g/dL (ref 30.0–36.0)
MCV: 87.3 fL (ref 80.0–100.0)
MCV: 87.4 fL (ref 80.0–100.0)
MCV: 88.1 fL (ref 80.0–100.0)
Platelets: 256 10*3/uL (ref 150–400)
Platelets: 265 10*3/uL (ref 150–400)
Platelets: 276 10*3/uL (ref 150–400)
RBC: 3.36 MIL/uL — ABNORMAL LOW (ref 4.22–5.81)
RBC: 3.39 MIL/uL — ABNORMAL LOW (ref 4.22–5.81)
RBC: 3.41 MIL/uL — ABNORMAL LOW (ref 4.22–5.81)
RDW: 16.2 % — ABNORMAL HIGH (ref 11.5–15.5)
RDW: 16.5 % — ABNORMAL HIGH (ref 11.5–15.5)
RDW: 16.5 % — ABNORMAL HIGH (ref 11.5–15.5)
WBC: 11.5 10*3/uL — ABNORMAL HIGH (ref 4.0–10.5)
WBC: 11.8 10*3/uL — ABNORMAL HIGH (ref 4.0–10.5)
WBC: 12.8 10*3/uL — ABNORMAL HIGH (ref 4.0–10.5)
nRBC: 0 % (ref 0.0–0.2)
nRBC: 0 % (ref 0.0–0.2)
nRBC: 0.2 % (ref 0.0–0.2)

## 2018-10-24 LAB — BASIC METABOLIC PANEL
Anion gap: 9 (ref 5–15)
BUN: 24 mg/dL — ABNORMAL HIGH (ref 8–23)
CO2: 23 mmol/L (ref 22–32)
Calcium: 10.9 mg/dL — ABNORMAL HIGH (ref 8.9–10.3)
Chloride: 106 mmol/L (ref 98–111)
Creatinine, Ser: 1.06 mg/dL (ref 0.61–1.24)
GFR calc Af Amer: >60 mL/min (ref >60)
GFR calc non Af Amer: >60 mL/min (ref >60)
Glucose, Bld: 93 mg/dL (ref 70–99)
Potassium: 4.1 mmol/L (ref 3.5–5.1)
Sodium: 138 mmol/L (ref 135–145)

## 2018-10-24 LAB — CULTURE, BLOOD (ROUTINE X 2): Culture: NO GROWTH

## 2018-10-24 LAB — TROPONIN I
Troponin I: <0.03 ng/mL (ref <0.03)
Troponin I: <0.03 ng/mL (ref <0.03)
Troponin I: <0.03 ng/mL (ref <0.03)

## 2018-10-24 LAB — HEPATIC FUNCTION PANEL
ALT: 14 U/L (ref 0–44)
AST: 14 U/L — ABNORMAL LOW (ref 15–41)
Albumin: 2.3 g/dL — ABNORMAL LOW (ref 3.5–5.0)
Alkaline Phosphatase: 62 U/L (ref 38–126)
Bilirubin, Direct: 0.2 mg/dL (ref 0.0–0.2)
Indirect Bilirubin: 0.4 mg/dL (ref 0.3–0.9)
Total Bilirubin: 0.6 mg/dL (ref 0.3–1.2)
Total Protein: 6.4 g/dL — ABNORMAL LOW (ref 6.5–8.1)

## 2018-10-24 LAB — ABO/RH: ABO/RH(D): A NEG

## 2018-10-24 MED ORDER — ADULT MULTIVITAMIN W/MINERALS CH
1.0000 | ORAL_TABLET | Freq: Every day | ORAL | Status: DC
  Administered 2018-10-24 – 2018-11-01 (×9): 1 via ORAL
  Filled 2018-10-24 (×11): qty 1

## 2018-10-24 MED ORDER — SODIUM CHLORIDE 0.9% FLUSH
10.0000 mL | INTRAVENOUS | Status: DC | PRN
  Administered 2018-10-25: 10 mL
  Filled 2018-10-24: qty 40

## 2018-10-24 MED ORDER — VANCOMYCIN HCL 10 G IV SOLR
1500.0000 mg | INTRAVENOUS | Status: DC
  Administered 2018-10-25: 1500 mg via INTRAVENOUS
  Filled 2018-10-24: qty 1500

## 2018-10-24 MED ORDER — SODIUM CHLORIDE 0.9% FLUSH
10.0000 mL | Freq: Two times a day (BID) | INTRAVENOUS | Status: DC
  Administered 2018-10-24 – 2018-11-02 (×5): 10 mL

## 2018-10-24 MED ORDER — VANCOMYCIN HCL 10 G IV SOLR
1750.0000 mg | Freq: Once | INTRAVENOUS | Status: AC
  Administered 2018-10-24: 1750 mg via INTRAVENOUS
  Filled 2018-10-24: qty 1750

## 2018-10-24 NOTE — Progress Notes (Addendum)
PROGRESS NOTE    Roger Blackwell  PXT:[id number] DOB: [birth year] DOA: 10/24/2018 PCP: [name], MD   Brief Narrative: Patient is 83 year old male with history of recently diagnosed squamous cell carcinoma of the lung, diabetes mellitus, diastolic CHF, A. fib, hypertension, anemia, necrotizing pneumonia who was brought to the emergency department with complaints of hemoptysis for last 2 days.  CT chest done in the emergency department showed worsening right hilar and subcarinal mass with progressive narrowing of the bronchus and also worsening consolidation.  Patient has been started on broad-spectrum antibiotics.  Oncology and PCCM consulted.  Hemoptysis  has apparently stopped .  Assessment & Plan:   Principal Problem:   Hemoptysis Active Problems:   CAD s/p left circumflex coronary stents 2002   Essential hypertension   CKD (chronic kidney disease) stage 3, GFR 30-59 ml/min (HCC)   A-fib (HCC)   Anemia, chronic disease   Hemoptysis: Anticoagulation on hold.  PCCM consulted.  Did not recommend bronchoscopy at this time.  Hemoptysis has stopped at this point.  We will continue to monitor.  Worsening pneumonia: History of necrotizing pneumonia.  CT chest showed worsening consolidation.  Most likely postobstructive pneumonia.  Continue antibiotics.  Squamous cell carcinoma of right lung: Oncology following.  Further recommendation as per oncology.CT also showed enlarging 2.9 cm left adrenal mass, probable metastatic lesion.  A. fib with RVR: Currently heart rate is controlled.  Continue Cardizem.  Anticoagulation will be held most likely permanently  CKD stage III: Currently kidney function at baseline  Diastolic CHF: Currently euvolemic.  Anemia secondary to chronic kidney disease/chronic blood loss: On iron supplements at home  Hypercalcemia: Likely from squamous cell carcinoma.  Continue to monitor.  Hypertension: Currently blood pressure stable.  Continue Cardizem  Recent  fall with difficulty ambulation: X-ray did not show any fracture or dislocation.  Has generalized weakness.  Will request for physical therapy evaluation   Nutrition Problem: Increased nutrient needs Etiology: cancer and cancer related treatments      DVT prophylaxis: SCD Code Status: Full Family Communication: Called wife for update on her phone,call not received Disposition Plan: Undetermined at this point   Consultants: PCCM, oncology  Procedures: None  Antimicrobials:  Anti-infectives (From admission, onward)   Start     Dose/Rate Route Frequency Ordered Stop   10/25/18 0400  vancomycin (VANCOCIN) 1,500 mg in sodium chloride 0.9 % 500 mL IVPB     1,500 mg 250 mL/hr over 120 Minutes Intravenous Every 24 hours 10/24/18 0303     10/24/18 0400  vancomycin (VANCOCIN) 1,750 mg in sodium chloride 0.9 % 500 mL IVPB     1,750 mg 250 mL/hr over 120 Minutes Intravenous  Once 10/24/18 0303 10/24/18 0725   11/02/2018 2359  piperacillin-tazobactam (ZOSYN) IVPB 3.375 g     3.375 g 12.5 mL/hr over 240 Minutes Intravenous Every 8 hours 11/10/2018 2324        Subjective: Patient seen and examined the bedside this morning.  Currently hemodynamically stable.  Hematemesis has resolved.  Respiratory status stable.  Denies any complaints.  Voice was muffled so limited history was obtained.  Objective: Vitals:   11/08/2018 2209 11/06/2018 2256 10/26/2018 2300 10/24/18 0521  BP: 105/60 102/67  (!) 113/53  Pulse: 80 (!) 110  85  Resp: (!) 22 20  18   Temp: 98.6 F (37 C) 98.1 F (36.7 C)  98.4 F (36.9 C)  TempSrc: Oral Oral  Oral  SpO2: 96% 98%  96%  Weight:   90 kg  Height:   6\' 3"  (1.905 m)     Intake/Output Summary (Last 24 hours) at 10/24/2018 1341 Last data filed at 10/24/2018 1041 Gross per 24 hour  Intake 943.2 ml  Output 300 ml  Net 643.2 ml   Filed Weights   10/30/2018 2300  Weight: 90 kg    Examination:  General exam: Not in distress,average built HEENT:PERRL,Oral mucosa  moist, Ear/Nose normal on gross exam,muffled voice Respiratory system: Decreased air entry in the right side,scattered rhonci Cardiovascular system: S1 & S2 heard, RRR. No JVD, murmurs, rubs, gallops or clicks. No pedal edema. Gastrointestinal system: Abdomen is nondistended, soft and nontender. No organomegaly or masses felt. Normal bowel sounds heard. Central nervous system: Alert and oriented. No focal neurological deficits. Extremities: No edema, no clubbing ,no cyanosis, distal peripheral pulses palpable. Skin: No rashes, lesions or ulcers,no icterus ,no pallor MSK: Normal muscle bulk,tone ,power Psychiatry: Judgement and insight appear normal. Mood & affect appropriate.     Data Reviewed: I have personally reviewed following labs and imaging studies  CBC: Recent Labs  Lab 10/19/18 1610 10/22/2018 1641 10/24/18 0011 10/24/18 0510 10/24/18 1110  WBC 12.5* 13.6* 12.8* 11.8* 11.5*  NEUTROABS 9.9* 10.9*  --   --   --   HGB 10.0* 9.1* 8.9* 9.0* 9.0*  HCT 33.0* 29.8* 29.8* 29.6* 29.6*  MCV 85.9 86.9 87.4 87.3 88.1  PLT 284 298 276 265 195   Basic Metabolic Panel: Recent Labs  Lab 10/19/18 1610 11/11/2018 1641 11/12/2018 2008 10/24/18 0510  NA 139 134*  --  138  K 4.0 >7.5* 4.2 4.1  CL 103 104  --  106  CO2 27 24  --  23  GLUCOSE 102* 91  --  93  BUN 23 24*  --  24*  CREATININE 1.06 1.39*  --  1.06  CALCIUM 11.1* 10.5*  --  10.9*   GFR: Estimated Creatinine Clearance: 66.4 mL/min (by C-G formula based on SCr of 1.06 mg/dL). Liver Function Tests: Recent Labs  Lab 10/19/18 1610 10/24/18 0510  AST 17 14*  ALT 16 14  ALKPHOS 73 62  BILITOT 0.7 0.6  PROT 7.2 6.4*  ALBUMIN 2.5* 2.3*   No results for input(s): LIPASE, AMYLASE in the last 168 hours. No results for input(s): AMMONIA in the last 168 hours. Coagulation Profile: Recent Labs  Lab 11/14/2018 1641  INR 1.7*   Cardiac Enzymes: Recent Labs  Lab 10/24/18 0510 10/24/18 1110  TROPONINI <0.03 <0.03   BNP  (last 3 results) No results for input(s): PROBNP in the last 8760 hours. HbA1C: No results for input(s): HGBA1C in the last 72 hours. CBG: No results for input(s): GLUCAP in the last 168 hours. Lipid Profile: No results for input(s): CHOL, HDL, LDLCALC, TRIG, CHOLHDL, LDLDIRECT in the last 72 hours. Thyroid Function Tests: No results for input(s): TSH, T4TOTAL, FREET4, T3FREE, THYROIDAB in the last 72 hours. Anemia Panel: No results for input(s): VITAMINB12, FOLATE, FERRITIN, TIBC, IRON, RETICCTPCT in the last 72 hours. Sepsis Labs: No results for input(s): PROCALCITON, LATICACIDVEN in the last 168 hours.  Recent Results (from the past 240 hour(s))  Blood Culture (routine x 2)     Status: None   Collection Time: 10/19/18  4:36 PM  Result Value Ref Range Status   Specimen Description BLOOD RIGHT ANTECUBITAL  Final   Special Requests   Final    BOTTLES DRAWN AEROBIC AND ANAEROBIC Blood Culture results may not be optimal due to an excessive volume of blood received in  culture bottles   Culture   Final    NO GROWTH 5 DAYS Performed at [facility], [place] [address], [place], [place] [address]    Report Status 10/24/2018 FINAL  Final  Urine culture     Status: Abnormal   Collection Time: 10/19/18  5:15 PM  Result Value Ref Range Status   Specimen Description URINE, CLEAN CATCH  Final   Special Requests   Final    NONE Performed at [facility], [place] [address] [place], [place] [address]    Culture (A)  Final    >=100,000 COLONIES/mL PSEUDOMONAS AERUGINOSA >=100,000 COLONIES/mL ENTEROCOCCUS FAECALIS    Report Status 10/22/2018 FINAL  Final   Organism ID, Bacteria PSEUDOMONAS AERUGINOSA (A)  Final   Organism ID, Bacteria ENTEROCOCCUS FAECALIS (A)  Final      Susceptibility   Enterococcus faecalis - MIC*    AMPICILLIN <=2 SENSITIVE Sensitive     LEVOFLOXACIN 1 SENSITIVE Sensitive     NITROFURANTOIN <=16 SENSITIVE Sensitive     VANCOMYCIN 1 SENSITIVE Sensitive     *  >=100,000 COLONIES/mL ENTEROCOCCUS FAECALIS   Pseudomonas aeruginosa - MIC*    CEFTAZIDIME 4 SENSITIVE Sensitive     CIPROFLOXACIN <=0.25 SENSITIVE Sensitive     GENTAMICIN <=1 SENSITIVE Sensitive     IMIPENEM 1 SENSITIVE Sensitive     PIP/TAZO 8 SENSITIVE Sensitive     CEFEPIME 2 SENSITIVE Sensitive     * >=100,000 COLONIES/mL PSEUDOMONAS AERUGINOSA  SARS Coronavirus 2 (CEPHEID- Performed in [place] hospital lab), Hosp Order     Status: None   Collection Time: 11/12/2018  8:03 PM  Result Value Ref Range Status   SARS Coronavirus 2 NEGATIVE NEGATIVE Final    Comment: (NOTE) If result is NEGATIVE SARS-CoV-2 target nucleic acids are NOT DETECTED. The SARS-CoV-2 RNA is generally detectable in upper and lower  respiratory specimens during the acute phase of infection. The lowest  concentration of SARS-CoV-2 viral copies this assay can detect is 250  copies / mL. A negative result does not preclude SARS-CoV-2 infection  and should not be used as the sole basis for treatment or other  patient management decisions.  A negative result may occur with  improper specimen collection / handling, submission of specimen other  than nasopharyngeal swab, presence of viral mutation(s) within the  areas targeted by this assay, and inadequate number of viral copies  (<250 copies / mL). A negative result must be combined with clinical  observations, patient history, and epidemiological information. If result is POSITIVE SARS-CoV-2 target nucleic acids are DETECTED. The SARS-CoV-2 RNA is generally detectable in upper and lower  respiratory specimens dur ing the acute phase of infection.  Positive  results are indicative of active infection with SARS-CoV-2.  Clinical  correlation with patient history and other diagnostic information is  necessary to determine patient infection status.  Positive results do  not rule out bacterial infection or co-infection with other viruses. If result is PRESUMPTIVE  POSTIVE SARS-CoV-2 nucleic acids MAY BE PRESENT.   A presumptive positive result was obtained on the submitted specimen  and confirmed on repeat testing.  While 2019 novel coronavirus  (SARS-CoV-2) nucleic acids may be present in the submitted sample  additional confirmatory testing may be necessary for epidemiological  and / or clinical management purposes  to differentiate between  SARS-CoV-2 and other Sarbecovirus currently known to infect humans.  If clinically indicated additional testing with an alternate test  methodology [phone]) is advised.  The SARS-CoV-2 RNA is generally  detectable in upper and lower respiratory sp ecimens during the acute  phase of infection. The expected result is Negative. Fact Sheet for Patients:  StrictlyIdeas.no Fact Sheet for Healthcare Providers: BankingDealers.co.za This test is not yet approved or cleared by the [place] FDA and has been authorized for detection and/or diagnosis of SARS-CoV-2 by FDA under an Emergency Use Authorization (EUA).  This EUA will remain in effect (meaning this test can be used) for the duration of the COVID-19 declaration under Section 564(b)(1) of the Act, 21 U.S.C. section 360bbb-3(b)(1), unless the authorization is terminated or revoked sooner. Performed at [facility], [place] [address], [place], [place] [address]          Radiology Studies: Dg Knee 1-2 Views Left  Result Date: 10/24/2018 CLINICAL DATA:  Pain EXAM: LEFT KNEE - 1-2 VIEW COMPARISON:  None. FINDINGS: There is no acute displaced fracture or dislocation. Mild multicompartmental joint space narrowing is noted. There is a trace suprapatellar joint effusion. IMPRESSION: No acute osseous abnormality. Electronically Signed   By: Constance Holster M.D.   On: 10/24/2018 03:24   Dg Knee 1-2 Views Right  Result Date: 10/24/2018 CLINICAL DATA:  Pain EXAM: RIGHT KNEE - 1-2 VIEW COMPARISON:   None. FINDINGS: There is no acute displaced fracture or dislocation. There is no prepatellar soft tissue swelling. There is no significant joint effusion. There are degenerative changes of the knee, greatest within the medial and patellofemoral compartments. IMPRESSION: No acute osseous abnormality. Electronically Signed   By: Constance Holster M.D.   On: 10/24/2018 03:23   Ct Chest Wo Contrast  Result Date: 10/24/2018 CLINICAL DATA:  Hemoptysis. Lung carcinoma. EXAM: CT CHEST WITHOUT CONTRAST TECHNIQUE: Multidetector CT imaging of the chest was performed following the standard protocol without IV contrast. COMPARISON:  09/02/2018 FINDINGS: Cardiovascular: Heart size normal. No pericardial effusion. Mild calcified aortic plaque. Dilated proximal descending thoracic segment 3.8 cm diameter, stable since previous. Mediastinum/Nodes: Confluent right hilar and subcarinal mass, with progressive narrowing of distal right mainstem bronchus, right upper lobe bronchus and bronchus intermedius. Lungs/Pleura: Bilateral partially calcified pleural plaques. Cavitary mass in the posterior right lower lobe with fluid level as before. Progressive consolidation throughout basilar segments of the right lower lobe. Upper Abdomen: 2.9 cm enlarging left adrenal mass. No acute findings. Musculoskeletal: No fracture or worrisome bone lesion. IMPRESSION: 1. Worsening right hilar and subcarinal mass with progressive narrowing of the distal right mainstem bronchus. 2. Progressive consolidation throughout basilar segments of the right lower lobe. 3. Enlarging 2.9 cm left adrenal mass, probable metastatic lesion. Electronically Signed   By: Lucrezia Europe M.D.   On: 10/22/2018 18:49   Dg Pelvis Portable  Result Date: 10/24/2018 CLINICAL DATA:  Pain status post fall EXAM: PORTABLE PELVIS 1-2 VIEWS COMPARISON:  None. FINDINGS: There is no acute displaced fracture or dislocation. Multiple surgical clips project over the patient's pelvis. There  are degenerative changes of both hips, left worse than right. IMPRESSION: No acute osseous abnormality. Electronically Signed   By: Constance Holster M.D.   On: 10/24/2018 03:25   Dg Chest Port 1 View  Result Date: 11/05/2018 CLINICAL DATA:  Family stated patient started coughing up blood yesterday. Bright red and no clots. Patient has stage 3 lung cancer. Patient is on antibiotic for pneumonia. Patient has been tested twice for Covid and both negative. H/o Diabetes, CHF, CAD. Former smoker. EXAM: PORTABLE CHEST - 1 VIEW COMPARISON:  10/19/2018 FINDINGS: Persistent small right effusion  with adjacent opacity at the right lung base. Stable coarse interstitial opacities in the left lower lung. Partially calcified left pleural plaques. No pneumothorax. Heart size and mediastinal contours are within normal limits. Visualized bones unremarkable. IMPRESSION: Persistent small right pleural effusion and adjacent right lower lung atelectasis/consolidation. Electronically Signed   By: Lucrezia Europe M.D.   On: 11/12/2018 17:42   [place] Ekg Site Rite  Result Date: 10/24/2018 If Site Rite image not attached, placement could not be confirmed due to current cardiac rhythm.       Scheduled Meds:  allopurinol  200 mg Oral Daily   diltiazem  120 mg Oral Daily   latanoprost  1 drop Both Eyes QHS   mirtazapine  15 mg Oral QHS   Continuous Infusions:  piperacillin-tazobactam (ZOSYN)  IV 3.375 g (10/24/18 0023)   [START ON 10/25/2018] vancomycin       LOS: 0 days    Time spent: 35 mins.More than 50% of that time was spent in counseling and/or coordination of care.      Shelly Coss, MD [facility] Pager [phone]  If 7PM-7AM, please contact night-coverage [email or web address] Password [email or web address] 10/24/2018, 1:41 PM

## 2018-10-24 NOTE — Progress Notes (Signed)
Peripherally Inserted Central Catheter/Midline Placement  The IV Nurse has discussed with the patient and/or persons authorized to consent for the patient, the purpose of this procedure and the potential benefits and risks involved with this procedure.  The benefits include less needle sticks, lab draws from the catheter, and the patient may be discharged home with the catheter. Risks include, but not limited to, infection, bleeding, blood clot (thrombus formation), and puncture of an artery; nerve damage and irregular heartbeat and possibility to perform a PICC exchange if needed/ordered by physician.  Alternatives to this procedure were also discussed.  Bard Power PICC patient education guide, fact sheet on infection prevention and patient information card has been provided to patient /or left at bedside.  Consent obtain via phone from wife.  PICC/Midline Placement Documentation  PICC Single Lumen 10/24/18 PICC Right Brachial 44 cm 0 cm (Active)  Indication for Insertion or Continuance of Line Home intravenous therapies (PICC only) 10/24/2018  1:48 PM  Exposed Catheter (cm) 0 cm 10/24/2018  1:48 PM  Site Assessment Clean;Dry;Intact 10/24/2018  1:48 PM  Line Status Flushed;Saline locked;Blood return noted 10/24/2018  1:48 PM  Dressing Type Transparent 10/24/2018  1:48 PM  Dressing Status Clean;Dry;Intact;Antimicrobial disc in place 10/24/2018  1:48 PM  Dressing Change Due 10/31/18 10/24/2018  1:48 PM       [facility]ordan Payment 10/24/2018, 1:49 PM

## 2018-10-24 NOTE — Consult Note (Signed)
NAME:  Roger Blackwell, MRN:  [id number], DOB:  December 24, 1935, LOS: 0 ADMISSION DATE:  10/17/2018, CONSULTATION DATE:  6/9 REFERRING MD:  Hal Hope, CHIEF COMPLAINT:  Hemoptysis    Brief History   83 year old male w/ known and untreated to date stage III squamous cell lung cancer, admitted w/ post-obstructive necrotizing PNA on 6/8 w/ associated hemoptysis. PCCM asked to see re: hemoptysis   History of present illness   83 year old male w/ co-morbids as listed below. Presented to the ER initially on 6/4 at that time for slurred speech and facial droop as well as cough. Stroke eval was neg. Had CXR that also showed RLL airspace disease so sent home on levaquin for PNA. Reported to the ED again in 6/8 w/ cc; 2 day h/o hemoptysis. CT chest obtained. Showed worsening of right hilar and subcarinal lung mass w/ worsening airway compression and also worsening consolidation/necrotizing RLL process. He was admitted for treatment of PNA and hemoptysis. PCCM asked to see re: the hemoptysis   Past Medical History  Squamous cell lung cancer; stage III (diagnosed April 2020), yet to have therapy, necrotizing post-obstructive RLL PNA, afib on NOAC, anemia, diastolic HF, HTN   Significant Hospital Events   6/8 admitted, NOAC held. abx started   Consults:  pulm consulted for hemoptysis   Procedures:    Significant Diagnostic Tests:  CT chest 6/8: 1. Worsening right hilar and subcarinal mass with progressive narrowing of the distal right mainstem bronchus. 2. Progressive consolidation throughout basilar segments of the right lower lobe. 3. Enlarging 2.9 cm left adrenal mass, probable metastatic lesion.  Micro Data:  SARS coronavirus 6/8: neg    Antimicrobials:  vanc 6/8>>> Zosyn 6/8>>>  Interim history/subjective:  Feels a little better   Objective   Blood pressure (Abnormal) 113/53, pulse 85, temperature 98.4 F (36.9 C), temperature source Oral, resp. rate 18, height 6\' 3"  (1.905 m), weight 90  kg, SpO2 96 %.        Intake/Output Summary (Last 24 hours) at 10/24/2018 1028 Last data filed at 10/24/2018 0834 Gross per 24 hour  Intake 943.2 ml  Output no documentation  Net 943.2 ml   Filed Weights   10/20/2018 2300  Weight: 90 kg    Examination: General: chronically ill appearing 83 year old male. Lying in bed no distress  HENT: edentulous, MMM, sp slurred   Lungs: coarse scattered rhonchi no accessory use  Cardiovascular: Regular irreg  Abdomen: soft not tender  Extremities: warm and dry equal but weak strength  Neuro: awake, oriented X 1-2  GU: voids   Resolved Hospital Problem list     Assessment & Plan:   Hemoptysis  Stage III squamous cell lung cancer (dx'd April 2020; yet to start therapy) Recurrent Necrotizing PNA Post-obstructive Atelectasis  COPD afib (on NOAC) Diastolic HF Severe deconditioning Protein calorie malnutrition  Discussion  Hemoptysis in setting of known untreated squamous cell lung cancer & post-obstructive PNA while on NOAC Plan Stop NOAC (don't think this should be resumed at this point) Treat necrotizing PNA. Will need at least 14d treatment and should follow serial films and symptoms to decide on length abx course further. Could see [place] in office for this  Needs palliative care consult. Suspect the window for possible benefit from treatment of his cancer is rapidly closing.  No role for FOB.     Best practice:  Diet: reg  Pain/Anxiety/Delirium protocol (if indicated): NA VAP protocol (if indicated): NA DVT prophylaxis: scd (NOAC held)  GI prophylaxis: na Glucose control: na Mobility: w/ assist  Code Status: full code  Family Communication: NA Disposition: we will sign off. Call as needed.   Labs   CBC: Recent Labs  Lab 10/19/18 1610 10/29/2018 1641 10/24/18 0011 10/24/18 0510  WBC 12.5* 13.6* 12.8* 11.8*  NEUTROABS 9.9* 10.9*  --   --   HGB 10.0* 9.1* 8.9* 9.0*  HCT 33.0* 29.8* 29.8* 29.6*  MCV 85.9 86.9 87.4 87.3   PLT 284 298 276 916    Basic Metabolic Panel: Recent Labs  Lab 10/19/18 1610 10/18/2018 1641 10/25/2018 2008 10/24/18 0510  NA 139 134*  --  138  K 4.0 >7.5* 4.2 4.1  CL 103 104  --  106  CO2 27 24  --  23  GLUCOSE 102* 91  --  93  BUN 23 24*  --  24*  CREATININE 1.06 1.39*  --  1.06  CALCIUM 11.1* 10.5*  --  10.9*   GFR: Estimated Creatinine Clearance: 66.4 mL/min (by C-G formula based on SCr of 1.06 mg/dL). Recent Labs  Lab 10/19/18 1610 10/17/2018 1641 10/24/18 0011 10/24/18 0510  WBC 12.5* 13.6* 12.8* 11.8*    Liver Function Tests: Recent Labs  Lab 10/19/18 1610 10/24/18 0510  AST 17 14*  ALT 16 14  ALKPHOS 73 62  BILITOT 0.7 0.6  PROT 7.2 6.4*  ALBUMIN 2.5* 2.3*   No results for input(s): LIPASE, AMYLASE in the last 168 hours. No results for input(s): AMMONIA in the last 168 hours.  ABG No results found for: PHART, PCO2ART, PO2ART, HCO3, TCO2, ACIDBASEDEF, O2SAT   Coagulation Profile: Recent Labs  Lab 10/22/2018 1641  INR 1.7*    Cardiac Enzymes: Recent Labs  Lab 10/24/18 0510  TROPONINI <0.03    HbA1C: Hgb A1c MFr Bld  Date/Time Value Ref Range Status  05/19/2018 07:18 PM 5.9 (H) 4.8 - 5.6 % Final    Comment:    (NOTE) Pre diabetes:          5.7%-6.4% Diabetes:              >6.4% Glycemic control for   <7.0% adults with diabetes   07/20/2007 10:45 AM (H)  Final   8.1 (NOTE)   The ADA recommends the following therapeutic goals for glycemic   control related to Hgb A1C measurement:   Goal of Therapy:   < 7.0% Hgb A1C   Action Suggested:  > 8.0% Hgb A1C   Ref:  Diabetes Care, 22, Suppl. 1, 1999    CBG: No results for input(s): GLUCAP in the last 168 hours.  Review of Systems:   Review of Systems  Constitutional: Positive for malaise/fatigue and weight loss. Negative for fever.  HENT: Negative.   Eyes: Negative.   Respiratory: Positive for cough and sputum production.   Cardiovascular: Negative.   Gastrointestinal: Negative.    Genitourinary: Negative.   Musculoskeletal: Negative.   Skin: Negative.   Neurological: Positive for weakness.  Endo/Heme/Allergies: Negative.   Psychiatric/Behavioral: Negative.      Past Medical History  He,  has a past medical history of CAD (coronary artery disease), CHF (congestive heart failure) ([place]), DM (diabetes mellitus) ([place]), Dyslipidemia, Gout, Morbid obesity ([place]), and Systemic hypertension.   Surgical History    Past Surgical History:  Procedure Laterality Date  . BACK SURGERY    . CORONARY ANGIOPLASTY WITH STENT PLACEMENT  12/26/2000   80% prox. circumflex lesion w/successful stent placement.  Marland Kitchen NM MYOVIEW LTD  08/31/2010  Normal  . PERICARDIOCENTESIS    . PROSTATE SURGERY    . US ECHOCARDIOGRAPHY  08/31/2010   EF 50-55%,RV mildly dilated,mild aortic root dilatation  . VIDEO BRONCHOSCOPY Bilateral 05/23/2018   Procedure: VIDEO BRONCHOSCOPY WITHOUT FLUORO;  Surgeon: Marshell Garfinkel, MD;  Location: [facility];  Service: Cardiopulmonary;  Laterality: Bilateral;     Social History   reports that he quit smoking about 25 years ago. His smoking use included cigarettes. He has never used smokeless tobacco. He reports current alcohol use. He reports that he does not use drugs.   Family History   His family history includes Diabetes in his brother; Hypertension in his sister.   Allergies Allergies  Allergen Reactions  . Codeine Nausea And Vomiting and Other (See Comments)    Made the patient feel badly, also  . Diltiazem Nausea Only    Hallucinations, memory loss  . Ivp Dye [Iodinated Diagnostic Agents] Other (See Comments)    Blisters all over the body     Home Medications  Prior to Admission medications   Medication Sig Start Date End Date Taking? Authorizing Provider  albuterol (PROVENTIL HFA;VENTOLIN HFA) 108 (90 Base) MCG/ACT inhaler Inhale 2 puffs into the lungs every 6 (six) hours as needed for wheezing or shortness of breath. 08/30/18  Yes Ivin Poot, MD  allopurinol (ZYLOPRIM) 100 MG tablet Take 200 mg by mouth daily.    Yes [provider]  apixaban (ELIQUIS) 5 MG TABS tablet Take 1 tablet (5 mg total) by mouth 2 (two) times daily. 09/04/18  Yes Emokpae, Courage, MD  diltiazem (CARDIZEM CD) 120 MG 24 hr capsule Take 120 mg by mouth daily.  10/10/18  Yes [provider]  Ferrous Sulfate (IRON PO) Take 2 tablets by mouth every evening.   Yes [provider]  levofloxacin (LEVAQUIN) 500 MG tablet Take 1 tablet (500 mg total) by mouth daily for 6 days. 10/19/18 10/25/18 Yes Lorin Glass, PA-C  mirtazapine (REMERON) 15 MG tablet Take 1 tablet (15 mg total) by mouth at bedtime. 09/04/18 09/04/19 Yes Emokpae, Courage, MD  Potassium 99 MG TABS Take 99 mg by mouth daily.    Yes [provider]  Travoprost, BAK Free, (TRAVATAN) 0.004 % SOLN ophthalmic solution Place 1 drop into both eyes at bedtime.   Yes [provider]  diltiazem (CARDIZEM CD) 180 MG 24 hr capsule Take 1 capsule (180 mg total) by mouth daily for 30 days. Patient not taking: Reported on 11/05/2018 10/04/18 2018/11/05  Dessa Phi, DO  ipratropium-albuterol (DUONEB) 0.5-2.5 (3) MG/3ML SOLN Take 3 mLs by nebulization every 6 (six) hours as needed. Patient taking differently: Take 3 mLs by nebulization every 6 (six) hours as needed (sob).  09/14/18   Tanda Rockers, MD     Critical care time: na   Erick Colace ACNP-BC [place] Pager # [phone] OR # [phone] if no answer

## 2018-10-24 NOTE — Progress Notes (Addendum)
HEMATOLOGY-ONCOLOGY PROGRESS NOTE  SUBJECTIVE: I was notified of this patient's admission this morning.  Chart has been reviewed.  Briefly, the patient presented to the emergency room on 6/4 for slurred speech and facial droop and cough.  Stroke eval was negative.  Chest x-ray showed right lower lobe airspace disease and he was sent home on Levaquin for pneumonia.  He presented again to the emergency room on 6/8 due to a 2-day history of hemoptysis.  CT scan of chest showed worsening right hilar and subcarinal lung mass with worsening airway compression and also worsening consolidation/necrotizing right lower lobe process.  His last visit with medical oncology was on 08/31/2018.  He is being followed by medical oncology for anemia and newly diagnosed non-small cell lung cancer, squamous cell carcinoma.  He has been seen by cardiothoracic surgery and deemed not a candidate for surgery.  He was referred to radiation oncology for consideration of radiation who saw him on 10/02/2018 and recommend an MRI of the brain prior to starting any treatment.  On the day of his radiation oncology visit, he was admitted to the hospital for atrial fibrillation with RVR.  The patient has not followed up with medical oncology or radiation oncology to discuss further treatment options.  When seen today, the patient reports ongoing hemoptysis but reports that it is overall better.  He reports shortness of breath.  He offers no other complaints this morning.  REVIEW OF SYSTEMS:   Constitutional: Denies fevers, chills.  Has ongoing weight loss. Eyes: Denies blurriness of vision Ears, nose, mouth, throat, and face: Denies mucositis or sore throat Respiratory: Reports shortness of breath and hemoptysis Cardiovascular: Denies palpitation, chest discomfort Gastrointestinal:  Denies nausea, heartburn or change in bowel habits Skin: Denies abnormal skin rashes Lymphatics: Denies new lymphadenopathy or easy  bruising Neurological:Denies numbness, tingling or new weaknesses Behavioral/Psych: Mood is stable, no new changes  Extremities: No lower extremity edema All other systems were reviewed with the patient and are negative.  I have reviewed the past medical history, past surgical history, social history and family history with the patient and they are unchanged from previous note.   PHYSICAL EXAMINATION:  Vitals:   10/30/2018 2256 10/24/18 0521  BP: 102/67 (!) 113/53  Pulse: (!) 110 85  Resp: 20 18  Temp: 98.1 F (36.7 C) 98.4 F (36.9 C)  SpO2: 98% 96%   Filed Weights   11/09/2018 2300  Weight: 198 lb 6.6 oz (90 kg)    Intake/Output from previous day: 06/08 0701 - 06/09 0700 In: 213.6 [IV Piggyback:213.6] Out: -   GENERAL:alert, no distress and comfortable SKIN: skin color, texture, turgor are normal, no rashes or significant lesions EYES: normal, Conjunctiva are pink and non-injected, sclera clear OROPHARYNX:no exudate, no erythema and lips, buccal mucosa, and tongue normal  NECK: supple, thyroid normal size, non-tender, without nodularity LYMPH:  no palpable lymphadenopathy in the cervical, axillary or inguinal LUNGS: Coarse rhonchi, normal breathing effort HEART: Irregular and no murmurs and trace lower extremity edema ABDOMEN:abdomen soft, non-tender and normal bowel sounds Musculoskeletal:no cyanosis of digits and no clubbing  NEURO: alert & oriented x 3 with fluent speech, no focal motor/sensory deficits  LABORATORY DATA:  I have reviewed the data as listed CMP Latest Ref Rng & Units 10/24/2018 11/12/2018 11/01/2018  Glucose 70 - 99 mg/dL 93 - 91  BUN 8 - 23 mg/dL 24(H) - 24(H)  Creatinine 0.61 - 1.24 mg/dL 1.06 - 1.39(H)  Sodium 135 - 145 mmol/L 138 - 134(L)  Potassium 3.5 - 5.1 mmol/L 4.1 4.2 >7.5(HH)  Chloride 98 - 111 mmol/L 106 - 104  CO2 22 - 32 mmol/L 23 - 24  Calcium 8.9 - 10.3 mg/dL 10.9(H) - 10.5(H)  Total Protein 6.5 - 8.1 g/dL 6.4(L) - -  Total Bilirubin  0.3 - 1.2 mg/dL 0.6 - -  Alkaline Phos 38 - 126 U/L 62 - -  AST 15 - 41 U/L 14(L) - -  ALT 0 - 44 U/L 14 - -    Lab Results  Component Value Date   WBC 11.8 (H) 10/24/2018   HGB 9.0 (L) 10/24/2018   HCT 29.6 (L) 10/24/2018   MCV 87.3 10/24/2018   PLT 265 10/24/2018   NEUTROABS 10.9 (H) 11/09/2018    Dg Knee 1-2 Views Left  Result Date: 10/24/2018 CLINICAL DATA:  Pain EXAM: LEFT KNEE - 1-2 VIEW COMPARISON:  None. FINDINGS: There is no acute displaced fracture or dislocation. Mild multicompartmental joint space narrowing is noted. There is a trace suprapatellar joint effusion. IMPRESSION: No acute osseous abnormality. Electronically Signed   By: Constance Holster M.D.   On: 10/24/2018 03:24   Dg Knee 1-2 Views Right  Result Date: 10/24/2018 CLINICAL DATA:  Pain EXAM: RIGHT KNEE - 1-2 VIEW COMPARISON:  None. FINDINGS: There is no acute displaced fracture or dislocation. There is no prepatellar soft tissue swelling. There is no significant joint effusion. There are degenerative changes of the knee, greatest within the medial and patellofemoral compartments. IMPRESSION: No acute osseous abnormality. Electronically Signed   By: Constance Holster M.D.   On: 10/24/2018 03:23   Ct Head Wo Contrast  Result Date: 10/19/2018 CLINICAL DATA:  Facial droop since 0900 hours today. Slurred speech since last night at 1930 hours. EXAM: CT HEAD WITHOUT CONTRAST TECHNIQUE: Contiguous axial images were obtained from the base of the skull through the vertex without intravenous contrast. COMPARISON:  None. FINDINGS: Brain: Low density area in the anterior aspect of each cerebellar hemisphere with an appearance suggesting areas of acute infarction in the axial plane. In the sagittal and coronal planes, these have appearances suggesting normal folia. Mildly enlarged ventricles and cortical sulci. Mild patchy white matter low density in both cerebral hemispheres. No intracranial hemorrhage, mass lesion or CT evidence  of acute infarction. Vascular: No hyperdense vessel or unexpected calcification. Skull: Normal. Negative for fracture or focal lesion. Sinuses/Orbits: Status post left cataract extraction. Mild left ethmoid sinus mucosal thickening. Other: None. IMPRESSION: 1. Possible acute bilateral cerebellar hemisphere infarcts. Based on symptoms, further evaluation with brain MRI is recommended. 2. No intracranial hemorrhage. 3. Mild atrophy and mild chronic small vessel white matter ischemic changes in both cerebral hemispheres. Electronically Signed   By: Claudie Revering M.D.   On: 10/19/2018 17:02   Ct Chest Wo Contrast  Result Date: 11/04/2018 CLINICAL DATA:  Hemoptysis. Lung carcinoma. EXAM: CT CHEST WITHOUT CONTRAST TECHNIQUE: Multidetector CT imaging of the chest was performed following the standard protocol without IV contrast. COMPARISON:  09/02/2018 FINDINGS: Cardiovascular: Heart size normal. No pericardial effusion. Mild calcified aortic plaque. Dilated proximal descending thoracic segment 3.8 cm diameter, stable since previous. Mediastinum/Nodes: Confluent right hilar and subcarinal mass, with progressive narrowing of distal right mainstem bronchus, right upper lobe bronchus and bronchus intermedius. Lungs/Pleura: Bilateral partially calcified pleural plaques. Cavitary mass in the posterior right lower lobe with fluid level as before. Progressive consolidation throughout basilar segments of the right lower lobe. Upper Abdomen: 2.9 cm enlarging left adrenal mass. No acute findings. Musculoskeletal: No fracture  or worrisome bone lesion. IMPRESSION: 1. Worsening right hilar and subcarinal mass with progressive narrowing of the distal right mainstem bronchus. 2. Progressive consolidation throughout basilar segments of the right lower lobe. 3. Enlarging 2.9 cm left adrenal mass, probable metastatic lesion. Electronically Signed   By: Lucrezia Europe M.D.   On: 10/20/2018 18:49   Mr Brain Wo Contrast  Result Date:  10/19/2018 CLINICAL DATA:  Initial evaluation for acute slurred speech, facial droop. EXAM: MRI HEAD WITHOUT CONTRAST TECHNIQUE: Multiplanar, multiecho pulse sequences of the brain and surrounding structures were obtained without intravenous contrast. COMPARISON:  Prior CT from earlier the same day. FINDINGS: Brain: Generalized age appropriate cerebral atrophy. No significant cerebral white matter disease. No abnormal foci of restricted diffusion to suggest acute or subacute ischemia. Gray-white matter differentiation maintained. No encephalomalacia to suggest chronic cortical infarction. No foci of susceptibility artifact to suggest acute or chronic intracranial hemorrhage. No mass lesion, midline shift or mass effect. No hydrocephalus. No extra-axial fluid collection. Pituitary gland suprasellar region normal. Midline structures intact. Vascular: Major intracranial vascular flow voids are maintained. Skull and upper cervical spine: Craniocervical junction normal. Upper cervical spine within normal limits. Bone marrow signal intensity normal. 13 mm lipoma noted at the right occipital scalp. Scalp soft tissues otherwise unremarkable. Sinuses/Orbits: Patient status post ocular lens replacement on the left. Globes and orbital soft tissues demonstrate no acute finding. Mild scattered mucosal thickening within the ethmoidal air cells. Paranasal sinuses are otherwise clear. No mastoid effusion. Inner ear structures grossly normal. Other: None. IMPRESSION: Normal brain MRI for age. No acute intracranial infarct or other abnormality identified. Electronically Signed   By: Jeannine Boga M.D.   On: 10/19/2018 20:03   Dg Pelvis Portable  Result Date: 10/24/2018 CLINICAL DATA:  Pain status post fall EXAM: PORTABLE PELVIS 1-2 VIEWS COMPARISON:  None. FINDINGS: There is no acute displaced fracture or dislocation. Multiple surgical clips project over the patient's pelvis. There are degenerative changes of both hips, left  worse than right. IMPRESSION: No acute osseous abnormality. Electronically Signed   By: Constance Holster M.D.   On: 10/24/2018 03:25   Dg Chest Port 1 View  Result Date: 10/24/2018 CLINICAL DATA:  Family stated patient started coughing up blood yesterday. Bright red and no clots. Patient has stage 3 lung cancer. Patient is on antibiotic for pneumonia. Patient has been tested twice for Covid and both negative. H/o Diabetes, CHF, CAD. Former smoker. EXAM: PORTABLE CHEST - 1 VIEW COMPARISON:  10/19/2018 FINDINGS: Persistent small right effusion with adjacent opacity at the right lung base. Stable coarse interstitial opacities in the left lower lung. Partially calcified left pleural plaques. No pneumothorax. Heart size and mediastinal contours are within normal limits. Visualized bones unremarkable. IMPRESSION: Persistent small right pleural effusion and adjacent right lower lung atelectasis/consolidation. Electronically Signed   By: Lucrezia Europe M.D.   On: 10/24/2018 17:42   Dg Chest Port 1 View  Result Date: 10/19/2018 CLINICAL DATA:  Sepsis. EXAM: PORTABLE CHEST 1 VIEW COMPARISON:  Chest x-rays 5/18 and 10/10/2018 and chest CT 09/02/2018 FINDINGS: The heart is within normal limits in size and stable. Stable tortuosity of the thoracic aorta. Chronic bilateral lung changes with pleural calcifications and pulmonary scarring. Persistent right basilar process. Difficult to evaluate the cavitary findings when compared to prior CT scan. IMPRESSION: 1. Chronic underlying lung disease with pleural calcifications on the left side. 2. Persistent right basilar process likely combination of effusion, atelectasis and infection. Electronically Signed   By: Marijo Sanes  M.D.   On: 10/19/2018 16:31   Dg Chest Port 1 View  Result Date: 10/02/2018 CLINICAL DATA:  Atrial fibrillation with rapid ventricular response and hypotension. History of squamous cell lung cancer. EXAM: PORTABLE CHEST 1 VIEW COMPARISON:  Radiographs and  CT 09/02/2018. FINDINGS: 1408 hours. The heart size and mediastinal contours are grossly stable with persistent right hilar prominence. The known cavitary lesion posteriorly in the right hemithorax is partly obscured by pleuroparenchymal opacity. Pleural calcifications are present bilaterally. Overall appearance is similar to the prior radiographs, and no superimposed airspace disease or pneumothorax identified. The bones appear unchanged. IMPRESSION: No significant change is seen from the prior studies of 1 month ago. No acute superimposed process identified. Electronically Signed   By: Richardean Sale M.D.   On: 10/02/2018 14:46    ASSESSMENT AND PLAN: 1.  Hemoptysis 2.  Newly diagnosed non-small cell lung cancer, squamous cell 3.  Necrotizing pneumonia 4.  Anemia 5.  Atrial fibrillation  -Hemoptysis likely secondary to untreated lung cancer.  Will discuss this further with Dr. Irene Limbo regarding need for radiation.  MRI of the brain performed on 6/4/thousand 20 without contrast showed no evidence of metastatic disease. -Antibiotics per hospitalist. -Anemia is multi-factorial.  His hemoglobin is stable.  Recommend continued monitoring and transfuse if hemoglobin is less than 7.0. -Continue diltiazem for A. fib    LOS: 0 days   Mikey Bussing, DNP, AGPCNP-BC, AOCNP 10/24/18    ADDENDUM  .Patient was Personally and independently interviewed, examined and relevant elements of the history of present illness were reviewed in details and an assessment and plan was created. All elements of the patient's history of present illness , assessment and plan were discussed in details with Mikey Bussing, DNP. The above documentation reflects our combined findings assessment and plan.  Talked with patient and his had his wife on the phone. He notes no further hemoptysis and some improvement in his breathing. Still with poor performance status 3-4. Recommend -palliative care consultation for further goals  of care discussion  -not a good candidate for palliative chemotherapy or chemo-radiation. -would recommend radiation oncology consultation for consideration of palliative RT to dominant lung mass to help with symptom control and addressing his hemoptysis if patient agreeable. -will f/u patient as outpatient . -if patient response well to radiation therapy and has improvement in functional status and based on future goals of care could consider palliative immunotherapy or mild chemotherapy options in the future. -Pneumonia mx per hospitalist. -wife reports hesitation with consideration of SNF due to corona virus. Dispo based on goals of care and patient/family preference.  TT spent in counseling and goals of care discussion with 45 mins Sullivan Lone MD MS

## 2018-10-24 NOTE — Progress Notes (Signed)
Initial Nutrition Assessment  INTERVENTION:   -Patient to request Ensure supplements from unit as desired -Provide Magic cup BID with meals, each supplement provides 290 kcal and 9 grams of protein -Provide Multivitamin with minerals daily -Reviewed protein options for meals with patient  NUTRITION DIAGNOSIS:   Increased nutrient needs related to cancer and cancer related treatments as evidenced by estimated needs.  GOAL:   Patient will meet greater than or equal to 90% of their needs  MONITOR:   PO intake, Supplement acceptance, Labs, Weight trends, I & O's, Skin  REASON FOR ASSESSMENT:   (Wound)    ASSESSMENT:   83 year old male w/ known and untreated to date stage III squamous cell lung cancer, admitted w/ post-obstructive necrotizing PNA on 6/8 w/ associated hemoptysis.  Patient was diagnosed with stage III lung cancer in April 2020. 6/4: MRI revealed no metastatic disease  **RD working remotely**  Patient a little difficult to understand on phone. Patient reports he has had poor appetite for a long time. States he was not eating well PTA and currently still doesn't have much of an appetite. States he ate very little breakfast this morning. Pt states he drinks Boost at home but is not interested in having protein shakes ordered for him this admission. Encouraged pt to request shakes from unit if he would like one. Will order Magic cups on lunch and dinner meals. Reviewed protein foods to prioritize when eating.   Per weight records, pt has lost 51 lbs since 4/8 (20% wt loss x 2 months, significant for time frame). Pt with history of moderate malnutrition, suspect this continues or has worsened.   Labs reviewed. Medications: Remeron tablet daily   NUTRITION - FOCUSED PHYSICAL EXAM:  Unable to perform per department requirements to work remotely.  Diet Order:   Diet Order            Diet Heart Room service appropriate? Yes; Fluid consistency: Thin  Diet effective now               EDUCATION NEEDS:   Education needs have been addressed  Skin:  Skin Assessment: Skin Integrity Issues: Skin Integrity Issues:: Stage II Stage II: mid sacrum  Last BM:  PTA  Height:   Ht Readings from Last 1 Encounters:  10/30/2018 6\' 3"  (1.905 m)    Weight:   Wt Readings from Last 1 Encounters:  11/05/2018 90 kg    Ideal Body Weight:  89.1 kg  BMI:  Body mass index is 24.8 kg/m.  Estimated Nutritional Needs:   Kcal:  2500-2700  Protein:  125-135g  Fluid:  2.5L/day  Clayton Bibles, MS, RD, LDN [place] [facility] Pager: [phone] After Hours Pager: [phone]

## 2018-10-25 LAB — BASIC METABOLIC PANEL
Anion gap: 7 (ref 5–15)
BUN: 21 mg/dL (ref 8–23)
CO2: 26 mmol/L (ref 22–32)
Calcium: 10.9 mg/dL — ABNORMAL HIGH (ref 8.9–10.3)
Chloride: 105 mmol/L (ref 98–111)
Creatinine, Ser: 0.94 mg/dL (ref 0.61–1.24)
GFR calc Af Amer: >60 mL/min (ref >60)
GFR calc non Af Amer: >60 mL/min (ref >60)
Glucose, Bld: 110 mg/dL — ABNORMAL HIGH (ref 70–99)
Potassium: 4 mmol/L (ref 3.5–5.1)
Sodium: 138 mmol/L (ref 135–145)

## 2018-10-25 LAB — CBC WITH DIFFERENTIAL/PLATELET
Abs Immature Granulocytes: 0.04 10*3/uL (ref 0.00–0.07)
Basophils Absolute: 0 10*3/uL (ref 0.0–0.1)
Basophils Relative: 0 %
Eosinophils Absolute: 0.3 10*3/uL (ref 0.0–0.5)
Eosinophils Relative: 3 %
HCT: 28.2 % — ABNORMAL LOW (ref 39.0–52.0)
Hemoglobin: 8.5 g/dL — ABNORMAL LOW (ref 13.0–17.0)
Immature Granulocytes: 0 %
Lymphocytes Relative: 11 %
Lymphs Abs: 1.2 10*3/uL (ref 0.7–4.0)
MCH: 26.5 pg (ref 26.0–34.0)
MCHC: 30.1 g/dL (ref 30.0–36.0)
MCV: 87.9 fL (ref 80.0–100.0)
Monocytes Absolute: 1 10*3/uL (ref 0.1–1.0)
Monocytes Relative: 9 %
Neutro Abs: 8.5 10*3/uL — ABNORMAL HIGH (ref 1.7–7.7)
Neutrophils Relative %: 77 %
Platelets: 244 10*3/uL (ref 150–400)
RBC: 3.21 MIL/uL — ABNORMAL LOW (ref 4.22–5.81)
RDW: 16.5 % — ABNORMAL HIGH (ref 11.5–15.5)
WBC: 11.1 10*3/uL — ABNORMAL HIGH (ref 4.0–10.5)
nRBC: 0.2 % (ref 0.0–0.2)

## 2018-10-25 LAB — MRSA PCR SCREENING: MRSA by PCR: NEGATIVE

## 2018-10-25 NOTE — Evaluation (Signed)
Physical Therapy Evaluation Patient Details Name: Roger Blackwell MRN: [id number] DOB: Feb 24, 1936 Today's Date: 10/25/2018   History of Present Illness  83 yo male admitted to ED on 6/8 with weakness, falls, hemoptysis. Hx of stage III lung ca, necrotizing Pna, cad with coronary angioplasty, chf, dm, gout, afib, prostate ca.  Clinical Impression   Pt presents with generalized weakness, difficulty performing bed mobility tasks, decreased activity tolerance, and tachycardia with limited mobility. Pt to benefit from acute PT to address deficits. Performing bed mobility only and sat EOB, HR up to 158 bpm in afib. PT and RN deferred further mobility this session due to extreme tachycardia. Pt states he has 24/7 assist at home and his wife and son are used to helping him with mobility. PT recommending SNF vs HHPT, pending pt's progress with mobility while acute. PT to progress mobility as tolerated, and will continue to follow acutely.      Follow Up Recommendations Home health PT;SNF;Supervision/Assistance - 24 hour    Equipment Recommendations  None recommended by PT    Recommendations for Other Services       Precautions / Restrictions Precautions Precautions: Fall Restrictions Weight Bearing Restrictions: No      Mobility  Bed Mobility Overal bed mobility: Needs Assistance Bed Mobility: Supine to Sit;Sit to Supine;Rolling Rolling: Min assist   Supine to sit: Min assist;HOB elevated Sit to supine: Min assist;HOB elevated   General bed mobility comments: Min assist for rolling bilaterally for rolling completely onto side during bed pad change and bedpan removal. Min assist for supine<>sit for LE management, trunk elevation and lowering, and positioning upon return to bed with boost function and PT use of bed pads.  Transfers Overall transfer level: (NT - pt in afib with HR up to 158 bpm after bed-level activity, RN notified)                  Ambulation/Gait                Stairs            Wheelchair Mobility    Modified Rankin (Stroke Patients Only)       Balance Overall balance assessment: Needs assistance Sitting-balance support: Single extremity supported;Feet supported Sitting balance-Leahy Scale: Fair Sitting balance - Comments: Pt sat EOB ~5-7 minutes, tachycardia unresolved so PT assisted in laying pt down.    Standing balance support: (NT - pt in extreme tachy)                                 Pertinent Vitals/Pain Pain Assessment: No/denies pain    Home Living Family/patient expects to be discharged to:: Private residence Living Arrangements: Spouse/significant other;Children(son does not live with but helps as needed) Available Help at Discharge: Family;Available 24 hours/day Type of Home: House Home Access: Stairs to enter;Ramped entrance Entrance Stairs-Rails: Right Entrance Stairs-Number of Steps: Pt states he has a few steps to get into house, but "there should be a ramp when I get home" Home Layout: One level Home Equipment: Walker - 2 wheels;Cane - single point;Wheelchair - manual      Prior Function Level of Independence: Needs assistance   Gait / Transfers Assistance Needed: Pt states he uses RW for ambulation in home, at times uses wheelchair. Pt states his wife helps him in and out of bed.   ADL's / Homemaking Assistance Needed: Pt states his wife helps him do everything, including bathing,  dressing, household activities.        Hand Dominance   Dominant Hand: Right    Extremity/Trunk Assessment   Upper Extremity Assessment Upper Extremity Assessment: Defer to OT evaluation    Lower Extremity Assessment Lower Extremity Assessment: Generalized weakness(Pt able to perform ankle pumps, quad set, heel slide; limited in knee extension sitting EOB)    Cervical / Trunk Assessment Cervical / Trunk Assessment: Normal  Communication   Communication: Other (comment)(difficulty to  understand at times)  Cognition Arousal/Alertness: Awake/alert Behavior During Therapy: WFL for tasks assessed/performed Overall Cognitive Status: No family/caregiver present to determine baseline cognitive functioning                                 General Comments: Pt at first forgetting he was at the hospital, but realized he was without PT assist. Pt with increased time to answer subjective questions, requiring PT to repeat questions in different ways at times for pt understanding.       General Comments      Exercises General Exercises - Lower Extremity Ankle Circles/Pumps: AROM;Both;20 reps;Supine Quad Sets: AROM;Both;5 reps;Supine   Assessment/Plan    PT Assessment Patient needs continued PT services  PT Problem List Decreased strength;Decreased balance;Decreased mobility;Decreased knowledge of use of DME;Decreased safety awareness;Decreased cognition       PT Treatment Interventions DME instruction;Functional mobility training;Balance training;Patient/family education;Gait training;Therapeutic activities;Therapeutic exercise    PT Goals (Current goals can be found in the Care Plan section)  Acute Rehab PT Goals Patient Stated Goal: go home to wife PT Goal Formulation: With patient Time For Goal Achievement: 11/08/18 Potential to Achieve Goals: Good    Frequency Min 3X/week   Barriers to discharge        Co-evaluation               AM-PAC PT "6 Clicks" Mobility  Outcome Measure Help needed turning from your back to your side while in a flat bed without using bedrails?: A Little Help needed moving from lying on your back to sitting on the side of a flat bed without using bedrails?: A Little Help needed moving to and from a bed to a chair (including a wheelchair)?: A Little Help needed standing up from a chair using your arms (e.g., wheelchair or bedside chair)?: A Little Help needed to walk in hospital room?: A Lot Help needed climbing 3-5  steps with a railing? : A Lot 6 Click Score: 16    End of Session   Activity Tolerance: Treatment limited secondary to medical complications (Comment);Patient limited by fatigue(extreme tachycardia) Patient left: in bed;with bed alarm set;with SCD's reapplied;with call bell/phone within reach Nurse Communication: Mobility status PT Visit Diagnosis: Muscle weakness (generalized) (M62.81);Other abnormalities of gait and mobility (R26.89)    Time: 6789-3810 PT Time Calculation (min) (ACUTE ONLY): 25 min   Charges:   PT Evaluation $PT Eval Low Complexity: 1 Low PT Treatments $Therapeutic Activity: 8-22 mins      Tamir Wallman Conception Chancy, PT Acute Rehabilitation Services Pager [phone]  Office [phone]   Samie Barclift D Cristen Murcia 10/25/2018, 1:40 PM

## 2018-10-25 NOTE — Progress Notes (Addendum)
PROGRESS NOTE    Roger Blackwell  QJF:[id number] DOB: February 01, 1936 DOA: 10/24/2018 PCP: [name], MD   Brief Narrative: Patient is 83 year old male with history of recently diagnosed squamous cell carcinoma of the lung, diabetes mellitus, diastolic CHF, A. fib, hypertension, anemia, necrotizing pneumonia who was brought to the emergency department with complaints of hemoptysis for last 2 days.  CT chest done in the emergency department showed worsening right hilar and subcarinal mass with progressive narrowing of the bronchus and also worsening consolidation.  Patient has been started on broad-spectrum antibiotics.  Oncology and PCCM consulted.  Hemoptysis  has apparently stopped .  Assessment & Plan:   Principal Problem:   Hemoptysis Active Problems:   CAD s/p left circumflex coronary stents 2002   Essential hypertension   CKD (chronic kidney disease) stage 3, GFR 30-59 ml/min (HCC)   A-fib (HCC)   Anemia, chronic disease   Hemoptysis:Secondary to lung mass. Anticoagulation on hold.  PCCM consulted.  Did not recommend bronchoscopy at this time.  Hemoptysis has stopped at this point.  We will continue to monitor.  Worsening pneumonia:  CT chest showed worsening consolidation.  Most likely postobstructive pneumonia secondary to lung mass.  Continue antibiotics. He also has  history of necrotizing pneumonia on previous hospitalization. MRSA PCR negative.  Vancomycin discontinued.  Continue Zosyn for now.  Squamous cell carcinoma of right lung: Oncology following. CT finding as above. Further recommendation as per oncology.CT also showed enlarging 2.9 cm left adrenal mass, probable metastatic lesion.  A. fib with RVR: Currently heart rate is controlled.  Continue Cardizem.  Anticoagulation will be held most likely permanently  CKD stage III: Currently kidney function at baseline  Diastolic CHF: Currently euvolemic.  Normocytic Anemia: Most likely  secondary to chronic kidney  disease/chronic blood loss. On iron supplements at home  Hypercalcemia: Likely from squamous cell carcinoma.  Continue to monitor.  Hypertension: Currently blood pressure stable.  Continue Cardizem  Recent fall with difficulty ambulation: X-ray did not show any fracture or dislocation.  Has generalized weakness.  PT evaluated him and recommended SNF.   Nutrition Problem: Increased nutrient needs Etiology: cancer and cancer related treatments      DVT prophylaxis: SCD Code Status: Full Family Communication: Wife called for update Disposition Plan: PT evaluation pending.  Needs at least 1 or 2 days more of IV antibiotics.  Awaiting further plan from oncology.   Consultants: PCCM, oncology  Procedures: None  Antimicrobials:  Anti-infectives (From admission, onward)   Start     Dose/Rate Route Frequency Ordered Stop   10/25/18 0400  vancomycin (VANCOCIN) 1,500 mg in sodium chloride 0.9 % 500 mL IVPB     1,500 mg 250 mL/hr over 120 Minutes Intravenous Every 24 hours 10/24/18 0303     10/24/18 0400  vancomycin (VANCOCIN) 1,750 mg in sodium chloride 0.9 % 500 mL IVPB     1,750 mg 250 mL/hr over 120 Minutes Intravenous  Once 10/24/18 0303 10/24/18 0725   10/31/2018 2359  piperacillin-tazobactam (ZOSYN) IVPB 3.375 g     3.375 g 12.5 mL/hr over 240 Minutes Intravenous Every 8 hours 11/13/2018 2324        Subjective: Patient seen and examined at the bedside this morning.  Looks more comfortable today.  Voice muffled as yesterday.  Hemoptysis has stopped.  Objective: Vitals:   10/24/18 1406 10/24/18 2102 10/25/18 0548 10/25/18 1334  BP: 115/61 (!) 119/57 111/75 112/65  Pulse: 99 75 74 98  Resp: 14 18 20 18   Temp:  98.2 F (36.8 C) 98 F (36.7 C) (!) 97.3 F (36.3 C) (!) 97.5 F (36.4 C)  TempSrc: Oral Oral Oral Oral  SpO2: 97% 94% 100% 92%  Weight:      Height:        Intake/Output Summary (Last 24 hours) at 10/25/2018 1338 Last data filed at 10/25/2018 0800 Gross per 24  hour  Intake 392.91 ml  Output 725 ml  Net -332.09 ml   Filed Weights   10/25/2018 2300  Weight: 90 kg    Examination:  General exam: Not in distress, generalized weakness, chronically ill looking HEENT:PERRL,Oral mucosa moist, Ear/Nose normal on gross exam, muffled voice Respiratory system: Decreased air entry on the right side Cardiovascular system: S1 & S2 heard, RRR. No JVD, murmurs, rubs, gallops or clicks. Gastrointestinal system: Abdomen is nondistended, soft and nontender. No organomegaly or masses felt. Normal bowel sounds heard. Central nervous system: Alert and oriented. No focal neurological deficits. Extremities: No edema, no clubbing ,no cyanosis, distal peripheral pulses palpable. Skin: No rashes, lesions or ulcers,no icterus ,no pallor    Data Reviewed: I have personally reviewed following labs and imaging studies  CBC: Recent Labs  Lab 10/19/18 1610 10/22/2018 1641 10/24/18 0011 10/24/18 0510 10/24/18 1110 10/25/18 0548  WBC 12.5* 13.6* 12.8* 11.8* 11.5* 11.1*  NEUTROABS 9.9* 10.9*  --   --   --  8.5*  HGB 10.0* 9.1* 8.9* 9.0* 9.0* 8.5*  HCT 33.0* 29.8* 29.8* 29.6* 29.6* 28.2*  MCV 85.9 86.9 87.4 87.3 88.1 87.9  PLT 284 298 276 265 256 540   Basic Metabolic Panel: Recent Labs  Lab 10/19/18 1610 10/19/2018 1641 10/22/2018 2008 10/24/18 0510 10/25/18 0548  NA 139 134*  --  138 138  K 4.0 >7.5* 4.2 4.1 4.0  CL 103 104  --  106 105  CO2 27 24  --  23 26  GLUCOSE 102* 91  --  93 110*  BUN 23 24*  --  24* 21  CREATININE 1.06 1.39*  --  1.06 0.94  CALCIUM 11.1* 10.5*  --  10.9* 10.9*   GFR: Estimated Creatinine Clearance: 74.9 mL/min (by C-G formula based on SCr of 0.94 mg/dL). Liver Function Tests: Recent Labs  Lab 10/19/18 1610 10/24/18 0510  AST 17 14*  ALT 16 14  ALKPHOS 73 62  BILITOT 0.7 0.6  PROT 7.2 6.4*  ALBUMIN 2.5* 2.3*   No results for input(s): LIPASE, AMYLASE in the last 168 hours. No results for input(s): AMMONIA in the last 168  hours. Coagulation Profile: Recent Labs  Lab 10/22/2018 1641  INR 1.7*   Cardiac Enzymes: Recent Labs  Lab 10/24/18 0510 10/24/18 1110 10/24/18 1751  TROPONINI <0.03 <0.03 <0.03   BNP (last 3 results) No results for input(s): PROBNP in the last 8760 hours. HbA1C: No results for input(s): HGBA1C in the last 72 hours. CBG: No results for input(s): GLUCAP in the last 168 hours. Lipid Profile: No results for input(s): CHOL, HDL, LDLCALC, TRIG, CHOLHDL, LDLDIRECT in the last 72 hours. Thyroid Function Tests: No results for input(s): TSH, T4TOTAL, FREET4, T3FREE, THYROIDAB in the last 72 hours. Anemia Panel: No results for input(s): VITAMINB12, FOLATE, FERRITIN, TIBC, IRON, RETICCTPCT in the last 72 hours. Sepsis Labs: No results for input(s): PROCALCITON, LATICACIDVEN in the last 168 hours.  Recent Results (from the past 240 hour(s))  Blood Culture (routine x 2)     Status: None   Collection Time: 10/19/18  4:36 PM  Result Value Ref Range Status  Specimen Description BLOOD RIGHT ANTECUBITAL  Final   Special Requests   Final    BOTTLES DRAWN AEROBIC AND ANAEROBIC Blood Culture results may not be optimal due to an excessive volume of blood received in culture bottles   Culture   Final    NO GROWTH 5 DAYS Performed at [facility], [place] [address], [place], [place] [address]    Report Status 10/24/2018 FINAL  Final  Urine culture     Status: Abnormal   Collection Time: 10/19/18  5:15 PM  Result Value Ref Range Status   Specimen Description URINE, CLEAN CATCH  Final   Special Requests   Final    NONE Performed at [facility], [place] [address], [place], [place] [address]    Culture (A)  Final    >=100,000 COLONIES/mL PSEUDOMONAS AERUGINOSA >=100,000 COLONIES/mL ENTEROCOCCUS FAECALIS    Report Status 10/22/2018 FINAL  Final   Organism ID, Bacteria PSEUDOMONAS AERUGINOSA (A)  Final   Organism ID, Bacteria ENTEROCOCCUS FAECALIS (A)  Final      Susceptibility    Enterococcus faecalis - MIC*    AMPICILLIN <=2 SENSITIVE Sensitive     LEVOFLOXACIN 1 SENSITIVE Sensitive     NITROFURANTOIN <=16 SENSITIVE Sensitive     VANCOMYCIN 1 SENSITIVE Sensitive     * >=100,000 COLONIES/mL ENTEROCOCCUS FAECALIS   Pseudomonas aeruginosa - MIC*    CEFTAZIDIME 4 SENSITIVE Sensitive     CIPROFLOXACIN <=0.25 SENSITIVE Sensitive     GENTAMICIN <=1 SENSITIVE Sensitive     IMIPENEM 1 SENSITIVE Sensitive     PIP/TAZO 8 SENSITIVE Sensitive     CEFEPIME 2 SENSITIVE Sensitive     * >=100,000 COLONIES/mL PSEUDOMONAS AERUGINOSA  SARS Coronavirus 2 (CEPHEID- Performed in [facility] hospital lab), Hosp Order     Status: None   Collection Time: 10/29/2018  8:03 PM  Result Value Ref Range Status   SARS Coronavirus 2 NEGATIVE NEGATIVE Final    Comment: (NOTE) If result is NEGATIVE SARS-CoV-2 target nucleic acids are NOT DETECTED. The SARS-CoV-2 RNA is generally detectable in upper and lower  respiratory specimens during the acute phase of infection. The lowest  concentration of SARS-CoV-2 viral copies this assay can detect is 250  copies / mL. A negative result does not preclude SARS-CoV-2 infection  and should not be used as the sole basis for treatment or other  patient management decisions.  A negative result may occur with  improper specimen collection / handling, submission of specimen other  than nasopharyngeal swab, presence of viral mutation(s) within the  areas targeted by this assay, and inadequate number of viral copies  (<250 copies / mL). A negative result must be combined with clinical  observations, patient history, and epidemiological information. If result is POSITIVE SARS-CoV-2 target nucleic acids are DETECTED. The SARS-CoV-2 RNA is generally detectable in upper and lower  respiratory specimens dur ing the acute phase of infection.  Positive  results are indicative of active infection with SARS-CoV-2.  Clinical  correlation with patient history and other  diagnostic information is  necessary to determine patient infection status.  Positive results do  not rule out bacterial infection or co-infection with other viruses. If result is PRESUMPTIVE POSTIVE SARS-CoV-2 nucleic acids MAY BE PRESENT.   A presumptive positive result was obtained on the submitted specimen  and confirmed on repeat testing.  While 2019 novel coronavirus  (SARS-CoV-2) nucleic acids may be present in the submitted sample  additional confirmatory testing may be necessary  for epidemiological  and / or clinical management purposes  to differentiate between  SARS-CoV-2 and other Sarbecovirus currently known to infect humans.  If clinically indicated additional testing with an alternate test  methodology [phone]) is advised. The SARS-CoV-2 RNA is generally  detectable in upper and lower respiratory sp ecimens during the acute  phase of infection. The expected result is Negative. Fact Sheet for Patients:  StrictlyIdeas.no Fact Sheet for Healthcare Providers: BankingDealers.co.za This test is not yet approved or cleared by the [place] FDA and has been authorized for detection and/or diagnosis of SARS-CoV-2 by FDA under an Emergency Use Authorization (EUA).  This EUA will remain in effect (meaning this test can be used) for the duration of the COVID-19 declaration under Section 564(b)(1) of the Act, 21 U.S.C. section 360bbb-3(b)(1), unless the authorization is terminated or revoked sooner. Performed at [facility], [place] [address], [place], [place] [address]   MRSA PCR Screening     Status: None   Collection Time: 10/24/18  1:43 PM  Result Value Ref Range Status   MRSA by PCR NEGATIVE NEGATIVE Final    Comment:        The GeneXpert MRSA Assay (FDA approved for NASAL specimens only), is one component of a comprehensive MRSA colonization surveillance program. It is not intended to diagnose  MRSA infection nor to guide or monitor treatment for MRSA infections. Performed at [facility], [place] [address] [place], [place] [address]          Radiology Studies: Dg Knee 1-2 Views Left  Result Date: 10/24/2018 CLINICAL DATA:  Pain EXAM: LEFT KNEE - 1-2 VIEW COMPARISON:  None. FINDINGS: There is no acute displaced fracture or dislocation. Mild multicompartmental joint space narrowing is noted. There is a trace suprapatellar joint effusion. IMPRESSION: No acute osseous abnormality. Electronically Signed   By: Constance Holster M.D.   On: 10/24/2018 03:24   Dg Knee 1-2 Views Right  Result Date: 10/24/2018 CLINICAL DATA:  Pain EXAM: RIGHT KNEE - 1-2 VIEW COMPARISON:  None. FINDINGS: There is no acute displaced fracture or dislocation. There is no prepatellar soft tissue swelling. There is no significant joint effusion. There are degenerative changes of the knee, greatest within the medial and patellofemoral compartments. IMPRESSION: No acute osseous abnormality. Electronically Signed   By: Constance Holster M.D.   On: 10/24/2018 03:23   Ct Chest Wo Contrast  Result Date: 11/01/2018 CLINICAL DATA:  Hemoptysis. Lung carcinoma. EXAM: CT CHEST WITHOUT CONTRAST TECHNIQUE: Multidetector CT imaging of the chest was performed following the standard protocol without IV contrast. COMPARISON:  09/02/2018 FINDINGS: Cardiovascular: Heart size normal. No pericardial effusion. Mild calcified aortic plaque. Dilated proximal descending thoracic segment 3.8 cm diameter, stable since previous. Mediastinum/Nodes: Confluent right hilar and subcarinal mass, with progressive narrowing of distal right mainstem bronchus, right upper lobe bronchus and bronchus intermedius. Lungs/Pleura: Bilateral partially calcified pleural plaques. Cavitary mass in the posterior right lower lobe with fluid level as before. Progressive consolidation throughout basilar segments of the right lower lobe. Upper Abdomen:  2.9 cm enlarging left adrenal mass. No acute findings. Musculoskeletal: No fracture or worrisome bone lesion. IMPRESSION: 1. Worsening right hilar and subcarinal mass with progressive narrowing of the distal right mainstem bronchus. 2. Progressive consolidation throughout basilar segments of the right lower lobe. 3. Enlarging 2.9 cm left adrenal mass, probable metastatic lesion. Electronically Signed   By: Lucrezia Europe M.D.   On: 10/21/2018 18:49   Dg Pelvis Portable  Result Date:  10/24/2018 CLINICAL DATA:  Pain status post fall EXAM: PORTABLE PELVIS 1-2 VIEWS COMPARISON:  None. FINDINGS: There is no acute displaced fracture or dislocation. Multiple surgical clips project over the patient's pelvis. There are degenerative changes of both hips, left worse than right. IMPRESSION: No acute osseous abnormality. Electronically Signed   By: Constance Holster M.D.   On: 10/24/2018 03:25   Dg Chest Port 1 View  Result Date: 10/24/2018 CLINICAL DATA:  PICC line placement EXAM: PORTABLE CHEST 1 VIEW COMPARISON:  11/08/2018, 10/19/2018, 10/10/2018, CT chest 11/04/2018 FINDINGS: Right upper extremity catheter tip overlies the distal SVC. Bilateral calcified pleural plaques. Similar appearance of right pleural disease and hazy airspace disease in the right thorax. Stable cardiomediastinal silhouette. No pneumothorax. IMPRESSION: 1. Right upper extremity catheter tip overlies the distal SVC. 2. Similar appearance of right pleural and parenchymal disease. 3. Bilateral calcified pleural plaques Electronically Signed   By: Donavan Foil M.D.   On: 10/24/2018 15:28   Dg Chest Port 1 View  Result Date: 11/11/2018 CLINICAL DATA:  Family stated patient started coughing up blood yesterday. Bright red and no clots. Patient has stage 3 lung cancer. Patient is on antibiotic for pneumonia. Patient has been tested twice for Covid and both negative. H/o Diabetes, CHF, CAD. Former smoker. EXAM: PORTABLE CHEST - 1 VIEW COMPARISON:   10/19/2018 FINDINGS: Persistent small right effusion with adjacent opacity at the right lung base. Stable coarse interstitial opacities in the left lower lung. Partially calcified left pleural plaques. No pneumothorax. Heart size and mediastinal contours are within normal limits. Visualized bones unremarkable. IMPRESSION: Persistent small right pleural effusion and adjacent right lower lung atelectasis/consolidation. Electronically Signed   By: Lucrezia Europe M.D.   On: 11/05/2018 17:42   [place] Ekg Site Rite  Result Date: 10/24/2018 If Site Rite image not attached, placement could not be confirmed due to current cardiac rhythm.       Scheduled Meds:  allopurinol  200 mg Oral Daily   diltiazem  120 mg Oral Daily   latanoprost  1 drop Both Eyes QHS   mirtazapine  15 mg Oral QHS   multivitamin with minerals  1 tablet Oral Daily   sodium chloride flush  10-40 mL Intracatheter Q12H   Continuous Infusions:  piperacillin-tazobactam (ZOSYN)  IV 3.375 g (10/25/18 0911)   vancomycin 1,500 mg (10/25/18 0452)     LOS: 1 day    Time spent: 35 mins.More than 50% of that time was spent in counseling and/or coordination of care.      Shelly Coss, MD [facility] Pager [phone]  If 7PM-7AM, please contact night-coverage [email or web address] Password [email or web address] 10/25/2018, 1:38 PM

## 2018-10-26 DIAGNOSIS — L8990 Pressure ulcer of unspecified site, unspecified stage: Secondary | ICD-10-CM | POA: Insufficient documentation

## 2018-10-26 DIAGNOSIS — C3491 Malignant neoplasm of unspecified part of right bronchus or lung: Secondary | ICD-10-CM

## 2018-10-26 LAB — CBC WITH DIFFERENTIAL/PLATELET
Abs Immature Granulocytes: 0.06 10*3/uL (ref 0.00–0.07)
Basophils Absolute: 0 10*3/uL (ref 0.0–0.1)
Basophils Relative: 0 %
Eosinophils Absolute: 0.3 10*3/uL (ref 0.0–0.5)
Eosinophils Relative: 3 %
HCT: 29.7 % — ABNORMAL LOW (ref 39.0–52.0)
Hemoglobin: 9.1 g/dL — ABNORMAL LOW (ref 13.0–17.0)
Immature Granulocytes: 1 %
Lymphocytes Relative: 11 %
Lymphs Abs: 1.1 10*3/uL (ref 0.7–4.0)
MCH: 26.8 pg (ref 26.0–34.0)
MCHC: 30.6 g/dL (ref 30.0–36.0)
MCV: 87.4 fL (ref 80.0–100.0)
Monocytes Absolute: 0.9 10*3/uL (ref 0.1–1.0)
Monocytes Relative: 9 %
Neutro Abs: 8.1 10*3/uL — ABNORMAL HIGH (ref 1.7–7.7)
Neutrophils Relative %: 76 %
Platelets: 259 10*3/uL (ref 150–400)
RBC: 3.4 MIL/uL — ABNORMAL LOW (ref 4.22–5.81)
RDW: 16.5 % — ABNORMAL HIGH (ref 11.5–15.5)
WBC: 10.5 10*3/uL (ref 4.0–10.5)
nRBC: 0.2 % (ref 0.0–0.2)

## 2018-10-26 LAB — CREATININE, SERUM
Creatinine, Ser: 0.89 mg/dL (ref 0.61–1.24)
GFR calc Af Amer: >60 mL/min (ref >60)
GFR calc non Af Amer: >60 mL/min (ref >60)

## 2018-10-26 NOTE — Evaluation (Signed)
Occupational Therapy Evaluation Patient Details Name: Roger Blackwell MRN: [id number] DOB: [birth year] Today's Date: 10/26/2018    History of Present Illness 83 yo male admitted to ED on 6/8 with weakness, falls, hemoptysis. Hx of stage III lung ca, necrotizing Pna, cad with coronary angioplasty, chf, dm, gout, afib, prostate ca.   Clinical Impression   Pt was admitted for the above. At baseline, pt states that wife assists with all adls and walking to bathroom. Pt was only able to tolerate sitting eob x 4 minutes today. Will follow in acute setting with min A level goals, focusing on mobility to decrease caregiver burden during adls.    Follow Up Recommendations  Supervision/Assistance - 24 hour(as long as wife can manage, if not, snf), per notes, pt may have home hospice   Equipment Recommendations  3 in 1 bedside commode(if pt doesn't have)    Recommendations for Other Services       Precautions / Restrictions Precautions Precautions: Fall Restrictions Weight Bearing Restrictions: No      Mobility Bed Mobility     Rolling: Min assist   Supine to sit: Min assist;Mod assist;HOB elevated Sit to supine: Min assist;Mod assist;HOB elevated   General bed mobility comments: cues for sequence, assist for trunk sitting and up and legs lying down.  Rolled as pt thought he might need cleaning up.  He was incontinent x urine when sidelying. Did not have time to reach for urinal  Transfers                 General transfer comment: did not stand this session    Balance                                           ADL either performed or assessed with clinical judgement   ADL   Eating/Feeding: Set up   Grooming: Minimal assistance   Upper Body Bathing: Minimal assistance   Lower Body Bathing: Maximal assistance;Sitting/lateral leans   Upper Body Dressing : Moderate assistance   Lower Body Dressing: Maximal assistance;Sit to/from stand     Toilet  hygiene:  Max Assist           General ADL Comments: pt only sat eob and stated he needed to lie back down after sitting for about 4 minutes.  HR 102-148     Vision         Perception     Praxis      Pertinent Vitals/Pain Pain Assessment: No/denies pain     Hand Dominance     Extremity/Trunk Assessment Upper Extremity Assessment Upper Extremity Assessment: Generalized weakness           Communication Communication Communication: (difficult to understand)   Cognition Arousal/Alertness: Awake/alert Behavior During Therapy: WFL for tasks assessed/performed Overall Cognitive Status: No family/caregiver present to determine baseline cognitive functioning                                 General Comments: pt not oriented to place, twice during OT session. Follows one step commands with extra time   General Comments       Exercises     Shoulder Instructions      Home Living Family/patient expects to be discharged to:: Private residence Living Arrangements: Spouse/significant other;Children Available Help at Discharge: Family;Available 24 hours/day Type  of Home: House             Bathroom Shower/Tub: Walk-in Psychologist, prison and probation services: Standard     Home Equipment: Environmental consultant - 2 wheels;Cane - single point;Wheelchair - manual          Prior Functioning/Environment Level of Independence: Needs assistance  Gait / Transfers Assistance Needed: Pt states he uses RW for ambulation in home, at times uses wheelchair. Pt states his wife helps him in and out of bed.  ADL's / Homemaking Assistance Needed: Pt states his wife helps him do everything, including bathing, dressing, household activities.            OT Problem List: Decreased strength;Decreased activity tolerance;Impaired balance (sitting and/or standing);Decreased cognition;Decreased safety awareness;Decreased knowledge of use of DME or AE(likely decreased standing balance)      OT  Treatment/Interventions: Self-care/ADL training;Therapeutic exercise;DME and/or AE instruction;Therapeutic activities;Patient/family education;Balance training    OT Goals(Current goals can be found in the care plan section) Acute Rehab OT Goals Patient Stated Goal: go home to wife OT Goal Formulation: With patient Time For Goal Achievement: 11/09/18 Potential to Achieve Goals: Good ADL Goals Pt Will Perform Grooming: with set-up;sitting Pt Will Transfer to Toilet: with min assist;stand pivot transfer;bedside commode Additional ADL Goal #1: pt will go from sit to stand with min A and maintain x 2 minutes at this level for adls  OT Frequency: Min 2X/week   Barriers to D/C:            Co-evaluation              AM-PAC OT "6 Clicks" Daily Activity     Outcome Measure Help from another person eating meals?: None Help from another person taking care of personal grooming?: A Little Help from another person toileting, which includes using toliet, bedpan, or urinal?: A Lot Help from another person bathing (including washing, rinsing, drying)?: A Lot Help from another person to put on and taking off regular upper body clothing?: A Little Help from another person to put on and taking off regular lower body clothing?: A Lot 6 Click Score: 16   End of Session    Activity Tolerance: Patient limited by fatigue Patient left: in bed;with call bell/phone within reach;with bed alarm set  OT Visit Diagnosis: Muscle weakness (generalized) (M62.81)                Time: 0375-4360 OT Time Calculation (min): 21 min Charges:  OT General Charges $OT Visit: 1 Visit OT Evaluation $OT Eval Moderate Complexity: 1 Mod  Lesle Chris, OTR/L Acute Rehabilitation Services [phone] [facility] pager [phone] office 10/26/2018  [place] 10/26/2018, 3:23 PM

## 2018-10-26 NOTE — Progress Notes (Signed)
PROGRESS NOTE  Roger Blackwell VPX:[id number] DOB: April 17, 1936 DOA: 10/26/2018 PCP: [name], MD  Brief History   Patient is 83 year old male with history of recently diagnosed squamous cell carcinoma of the lung, diabetes mellitus, diastolic CHF, A. fib, hypertension, anemia, necrotizing pneumonia who was brought to the emergency department with complaints of hemoptysis for last 2 days.  CT chest done in the emergency department showed worsening right hilar and subcarinal mass with progressive narrowing of the bronchus and also worsening consolidation.  Patient has been started on broad-spectrum antibiotics.  Oncology and PCCM consulted. They don't feel that the patient would benefit from bronchoscopy at this time. Hemoglobin is stable.  Consultants   PCCM  Procedures   None  Antibiotics   Zosyn  Subjective  Pt states that he is without new complaints and that he has only had one episode of hemoptysis overnight.  Objective   Vitals:  Vitals:   10/26/18 1416 10/26/18 1423  BP: 113/66 (!) 147/54  Pulse: 79 72  Resp: 17 19  Temp:  97.8 F (36.6 C)  SpO2: 95% 93%    Exam:  Constitutional:   The patient is awake, alert, and oriented x 3. No acute distress. Respiratory:   No increased work of breathing.   Diminished breath sounds on right base. No wheezes, rales or rhonchi are appreciated.  No tactile fremitus Cardiovascular:   Regular rate and rhythm.  No murmurs, ectopy, or gallups.  No lateral PMI, no thrills.  No LE extremity edema    Normal pedal pulses Abdomen:   Abdomen is soft, non-tender, non-distended.  No hernias, masses, or organomegaly is appreciated.  Normoactive bowel sounds. Musculoskeletal:   No cyanosis, clubbing, or edema. Skin:   No rashes, lesions, ulcers  palpation of skin: no induration or nodules Neurologic:   CN 2-12 intact  Sensation all 4 extremities intact Psychiatric:   Mental status o Mood, affect  appropriate o Orientation to person, place, time   judgment and insight appear intact   I have personally reviewed the following:   Today's Data   Creatinine, CBC, Vitals  Micro Data   COVID-19 negative  MRSA by PCR negative  Imaging   CT chest without contrast: right sided mediastinal and subcarinal mass, adrenal mass  CXR Hazy opacificity in right lung  Scheduled Meds:  allopurinol  200 mg Oral Daily   diltiazem  120 mg Oral Daily   latanoprost  1 drop Both Eyes QHS   mirtazapine  15 mg Oral QHS   multivitamin with minerals  1 tablet Oral Daily   sodium chloride flush  10-40 mL Intracatheter Q12H   Continuous Infusions:  piperacillin-tazobactam (ZOSYN)  IV 3.375 g (10/26/18 4627)    Principal Problem:   Hemoptysis Active Problems:   CAD s/p left circumflex coronary stents 2002   Essential hypertension   CKD (chronic kidney disease) stage 3, GFR 30-59 ml/min (HCC)   A-fib (HCC)   Anemia, chronic disease   Pressure injury of skin   Squamous cell lung cancer, right ([place])   LOS: 2 days   A & P  Hemoptysis:Secondary to lung mass. Anticoagulation on hold.  PCCM consulted.  Did not recommend bronchoscopy at this time.  One episode of hemoptysis since yesterday. Hemoglobin increased to 9.1.  We will continue to monitor.  Worsening pneumonia:  CT chest showed worsening consolidation.  Most likely postobstructive pneumonia secondary to lung mass.  Continue antibiotics. He also has  history of necrotizing pneumonia on previous hospitalization. MRSA PCR  negative.  Vancomycin discontinued.  Continue Zosyn for now.  Squamous cell carcinoma of right lung: Oncology following. CT finding as above. Further recommendation as per oncology.CT also showed enlarging 2.9 cm left adrenal mass, probable metastatic lesion. MRI brain was negative for brain mets. The patient was evaluated by Dr. Irene Limbo for radiation oncology. Given the patient's poor performance status he is  considered a poor candidate for palliative chemotherapy or chemo-radiation. Recommendation is made for palliative care consult and radiation oncology consult for consideration of palliative RT to help with symptom control. If patient responds well to palliative radiation, and he has improvement in his functional status, it is possibly that he may be considered for palliative immunotherapy or mild chemotherapy options in the future.   A. fib with RVR: Currently heart rate is controlled on Cardizem.  Anticoagulation will be held due to hemoptysis. This is likely permanent.  CKD stage III: Currently kidney function at baseline. Creatinine 0.89 today.  Diastolic CHF: Currently euvolemic.  Normocytic Anemia: Most likely  secondary to chronic kidney disease/chronic blood loss. On iron supplements at home. Hemoglobin is improved at 9.1 today.  Hypercalcemia: Likely from squamous cell carcinoma.  Continue to monitor. Stable at 10.9.  Hypertension: Currently blood pressure stable.  Continue Cardizem.  Recent fall with difficulty ambulation: X-ray did not show any fracture or dislocation.  Has generalized weakness.  PT evaluated him and recommended SNF.   Nutrition Problem: Increased nutrient needs Etiology: cancer and cancer related treatments  I have seen and examined this patient myself. I have spent 32 minutes in his evaluation and care.  DVT prophylaxis: SCD Code Status: Full  Family Communication: Wife called for update Disposition Plan: PT recommends SNF. Family is reticent.  Needs at least 1 or 2 days more of IV antibiotics.  Awaiting further plan from  Radiation oncology.   Roger Goswami, DO [facility] Direct contact: see [email or web address]  7PM-7AM contact night coverage as above 10/26/2018, 4:08 PM  LOS: 2 days

## 2018-10-26 NOTE — Progress Notes (Signed)
Patient had 3.34 sec pause, asymptomatic. HR 93 on monitor. Will continue to monitor.

## 2018-10-26 NOTE — Progress Notes (Signed)
Pharmacy Antibiotic Note  Roger Blackwell is a 83 y.o. male admitted on 11/01/2018 with pneumonia.  Pharmacy has been consulted for Zosyn dosing. Patient is on day 3 of antibiotic therapy. Patient is currently afebrile, WBC down from 11.1 to 10.5, and Scr stable at 0.89.   Plan: Zosyn 3.375g IV q8h (4 hour infusion).  Monitor daily Scr F/u plans for DOT of antibiotics and de-escalation plans  Height: 6\' 3"  (190.5 cm) Weight: 203 lb 7.8 oz (92.3 kg) IBW/kg (Calculated) : 84.5  Temp (24hrs), Avg:97.5 F (36.4 C), Min:97.5 F (36.4 C), Max:97.6 F (36.4 C)  Recent Labs  Lab 10/19/18 1610 11/05/2018 1641 10/24/18 0011 10/24/18 0510 10/24/18 1110 10/25/18 0548 10/26/18 0548  WBC 12.5* 13.6* 12.8* 11.8* 11.5* 11.1* 10.5  CREATININE 1.06 1.39*  --  1.06  --  0.94 0.89    Estimated Creatinine Clearance: 79.1 mL/min (by C-G formula based on SCr of 0.89 mg/dL).    Allergies  Allergen Reactions  . Codeine Nausea And Vomiting and Other (See Comments)    Made the patient feel badly, also  . Diltiazem Nausea Only    Hallucinations, memory loss  . Ivp Dye [Iodinated Diagnostic Agents] Other (See Comments)    Blisters all over the body    Antimicrobials this admission: 6/8  Zosyn >>  6/7 Vanc >>6/11 PTA  6/4 Levaquin >> 6/7  Dose adjustments this admission: N/A  Microbiology results: 6/9 MRSA PCR: negative 6/8 Covid: neg 6/4 BCx: ngtd - final 6/4 UCx: Pseudomonas & Enterococcus (both pan-sens)  Thank you for allowing pharmacy to be a part of this patient's care.  Leron Croak, PharmD PGY1 Pharmacy Resident 10/26/2018, 11:08 AM

## 2018-10-26 NOTE — TOC Initial Note (Signed)
Transition of Care [facility]) - Initial/Assessment Note    Patient Details  Name: Roger Blackwell MRN: [id number] Date of Birth: 01/21/1936  [facility]) CM/SW Contact:    Servando Snare, LCSW Phone Number: 10/26/2018, 11:20 AM  Clinical Narrative:        Patient is from home with spouse. Prior to admission patient and family had decided on home with hospice. Patient already working with [facility], but not yet admitted. LCSW spoke with patient spouse and dc plan is to return home with hospice if plan remains appropriate.            Expected Discharge Plan: Home w Hospice Care Barriers to Discharge: Continued Medical Work up   Patient Goals and CMS Choice     Choice offered to / list presented to : NA  Expected Discharge Plan and Services Expected Discharge Plan: [place] Acute Care Choice: Hospice Living arrangements for the past 2 months: Single Family Home                                      Prior Living Arrangements/Services Living arrangements for the past 2 months: Single Family Home Lives with:: Spouse Patient language and need for interpreter reviewed:: Yes        Need for Family Participation in Patient Care: Yes (Comment) Care giver support system in place?: Yes (comment)   Criminal Activity/Legal Involvement Pertinent to Current Situation/Hospitalization: No - Comment as needed  Activities of Daily Living Home Assistive Devices/Equipment: Walker (specify type), Dentures (specify type) ADL Screening (condition at time of admission) Patient's cognitive ability adequate to safely complete daily activities?: Yes Is the patient deaf or have difficulty hearing?: No Does the patient have difficulty seeing, even when wearing glasses/contacts?: No Does the patient have difficulty concentrating, remembering, or making decisions?: No Patient able to express need for assistance with ADLs?: Yes Does the patient have  difficulty dressing or bathing?: No Independently performs ADLs?: No Communication: Independent Dressing (OT): Needs assistance Is this a change from baseline?: Pre-admission baseline Grooming: Independent Feeding: Independent Bathing: Needs assistance Is this a change from baseline?: Pre-admission baseline Toileting: Independent In/Out Bed: Needs assistance Is this a change from baseline?: Pre-admission baseline Walks in Home: Needs assistance Is this a change from baseline?: Pre-admission baseline Does the patient have difficulty walking or climbing stairs?: Yes Weakness of Legs: Both Weakness of Arms/Hands: None  Permission Sought/Granted Permission sought to share information with : Family Supports Permission granted to share information with : No(patient sleeping)  Share Information with NAME: [place] granted to share info w AGENCY: [facility]  Permission granted to share info w Relationship: Spouse     Emotional Assessment Appearance:: Appears stated age Attitude/Demeanor/Rapport: Sedated Affect (typically observed): Unable to Assess Orientation: : Oriented to Self, Oriented to Place, Oriented to  Time Alcohol / Substance Use: Not Applicable Psych Involvement: No (comment)  Admission diagnosis:  Hemoptysis [R04.2] Patient Active Problem List   Diagnosis Date Noted  . Pressure injury of skin 10/26/2018  . Squamous cell lung cancer, right ([place])   . Anemia, chronic disease 11/09/2018  . A-fib ([place]) 10/02/2018  . Primary cancer of right lower lobe of lung ([place]) 10/02/2018  . Atrial fibrillation with RVR ([place]) 09/02/2018  . DOE (dyspnea on exertion) 07/17/2018  . Necrotizing pneumonia ([place]) 06/15/2018  . S/P bronchoscopy with  biopsy   . Malnutrition of moderate degree 05/21/2018  . Hemoptysis 05/20/2018  . Weight loss   . Cough with hemoptysis 05/19/2018  . Acute diastolic CHF (congestive heart failure) ([place]) 05/19/2018  . Gout 05/19/2018  .  Community acquired pneumonia of right lung   . CKD (chronic kidney disease) stage 3, GFR 30-59 ml/min (HCC) 03/19/2018  . Iron deficiency anemia 08/10/2017  . Dyslipidemia 12/30/2014  . CAD s/p left circumflex coronary stents 2002 05/25/2013  . Class 2 severe obesity due to excess calories with serious comorbidity and body mass index (BMI) of 36.0 to 36.9 in adult ([place]) 05/25/2013  . Chronic diastolic heart failure ([place]) 05/25/2013  . Hypercholesterolemia 05/25/2013  . Essential hypertension 05/25/2013  . Glaucoma 05/25/2013  . Type 2 diabetes mellitus with stage 3 chronic kidney disease, with long-term current use of insulin ([place]) 05/25/2013   PCP:  [name], MD Pharmacy:   [id number] - [place], [address] [place] [address] Phone: [phone] Fax: [phone]  [facility] [place] [place] [address] [place] [address] Phone: [phone] Fax: [phone]     Social Determinants of Health (SDOH) Interventions    Readmission Risk Interventions Readmission Risk Prevention Plan 10/03/2018  Transportation Screening Complete  PCP or Specialist Appt within 3-5 Days Not Complete  Not Complete comments not yet ready for d/c  HRI or [place] Not Complete  HRI or Home Care Consult comments no needs at this time  Social Work Consult for [place] Planning/Counseling Not Complete  SW consult not completed comments no needs at this time  Palliative Care Screening Not Applicable  Medication Review (RN Care Manager) Complete  Some recent data might be hidden

## 2018-10-26 NOTE — Progress Notes (Signed)
  Radiation Oncology         [phone] ________________________________  Name: Roger Blackwell MRN: [id number]  Date: 11/09/2018  DOB: Oct 25, 1935  Chart Note:  I was contacted by the hospitalist service to see this patient.  He is a very nice 83 year old gentleman seen in radiation oncology on May 18 for squamous cell carcinoma of the right lower lung with mediastinal adenopathy.  The patient was experiencing some hemoptysis which has gradually worsened requiring hospital admission.  He also has postobstructive pneumonia.  Under the circumstances, I would favor proceeding with palliative radiotherapy for hemostasis to the central mediastinum as early as tomorrow.  I will work to coordinate set up and initiation of radiation therapy.  ________________________________  Sheral Apley Tammi Klippel, M.D.

## 2018-10-27 ENCOUNTER — Ambulatory Visit
Admission: RE | Admit: 2018-10-27 | Discharge: 2018-10-27 | Disposition: A | Discharge status: Home / Self Care | Payer: Medicare Other | Source: Ambulatory Visit | Attending: Radiation Oncology | Admitting: Radiation Oncology

## 2018-10-27 DIAGNOSIS — R042 Hemoptysis: Secondary | ICD-10-CM

## 2018-10-27 DIAGNOSIS — C3431 Malignant neoplasm of lower lobe, right bronchus or lung: Secondary | ICD-10-CM

## 2018-10-27 LAB — CBC WITH DIFFERENTIAL/PLATELET
Abs Immature Granulocytes: 0.06 10*3/uL (ref 0.00–0.07)
Basophils Absolute: 0.1 10*3/uL (ref 0.0–0.1)
Basophils Relative: 1 %
Eosinophils Absolute: 0.3 10*3/uL (ref 0.0–0.5)
Eosinophils Relative: 3 %
HCT: 30.7 % — ABNORMAL LOW (ref 39.0–52.0)
Hemoglobin: 9.2 g/dL — ABNORMAL LOW (ref 13.0–17.0)
Immature Granulocytes: 1 %
Lymphocytes Relative: 11 %
Lymphs Abs: 1.2 10*3/uL (ref 0.7–4.0)
MCH: 26 pg (ref 26.0–34.0)
MCHC: 30 g/dL (ref 30.0–36.0)
MCV: 86.7 fL (ref 80.0–100.0)
Monocytes Absolute: 1 10*3/uL (ref 0.1–1.0)
Monocytes Relative: 9 %
Neutro Abs: 7.9 10*3/uL — ABNORMAL HIGH (ref 1.7–7.7)
Neutrophils Relative %: 75 %
Platelets: 262 10*3/uL (ref 150–400)
RBC: 3.54 MIL/uL — ABNORMAL LOW (ref 4.22–5.81)
RDW: 16.5 % — ABNORMAL HIGH (ref 11.5–15.5)
WBC: 10.5 10*3/uL (ref 4.0–10.5)
nRBC: 0.2 % (ref 0.0–0.2)

## 2018-10-27 LAB — CREATININE, SERUM
Creatinine, Ser: 0.82 mg/dL (ref 0.61–1.24)
GFR calc Af Amer: >60 mL/min (ref >60)
GFR calc non Af Amer: >60 mL/min (ref >60)

## 2018-10-27 NOTE — Progress Notes (Signed)
PROGRESS NOTE  Roger Blackwell EQA:[id number] DOB: 07-Sep-1935 DOA: 11/14/2018 PCP: [name], MD  Brief History   Patient is 83 year old male with history of recently diagnosed squamous cell carcinoma of the lung, diabetes mellitus, diastolic CHF, A. fib, hypertension, anemia, necrotizing pneumonia who was brought to the emergency department with complaints of hemoptysis for last 2 days.  CT chest done in the emergency department showed worsening right hilar and subcarinal mass with progressive narrowing of the bronchus and also worsening consolidation.  Patient has been started on broad-spectrum antibiotics.  Oncology and PCCM consulted. They don't feel that the patient would benefit from bronchoscopy at this time. Hemoglobin is stable.  Radiation oncology has been consulted. I appreciate their assistance.  Consultants  . PCCM  Procedures  . None  Antibiotics  . Zosyn  Subjective  Pt is resting comfortably. No new complaints.  Objective   Vitals:  Vitals:   10/27/18 0655 10/27/18 1415  BP: (!) 95/56 95/82  Pulse: (!) 59 (!) 57  Resp: 18 20  Temp: 98.4 F (36.9 C) 98.1 F (36.7 C)  SpO2: 94% 98%    Exam:  Constitutional:  . The patient is awake, alert, and oriented x 3. No acute distress. Respiratory:  . No increased work of breathing.  . Diminished breath sounds on right base. No wheezes, rales or rhonchi are appreciated. . No tactile fremitus Cardiovascular:  . Regular rate and rhythm. . No murmurs, ectopy, or gallups. . No lateral PMI, no thrills. . No LE extremity edema   . Normal pedal pulses Abdomen:  . Abdomen is soft, non-tender, non-distended. . No hernias, masses, or organomegaly is appreciated. . Normoactive bowel sounds. Musculoskeletal:  . No cyanosis, clubbing, or edema. Skin:  . No rashes, lesions, ulcers . palpation of skin: no induration or nodules Neurologic:  . CN 2-12 intact . Sensation all 4 extremities intact Psychiatric:  .  Mental status o Mood, affect appropriate o Orientation to person, place, time  . judgment and insight appear intact   I have personally reviewed the following:   Today's Data  . Creatinine, CBC, Vitals  Micro Data  . COVID-19 negative . MRSA by PCR negative  Imaging  . CT chest without contrast: right sided mediastinal and subcarinal mass, adrenal mass . CXR Hazy opacificity in right lung  Scheduled Meds: . allopurinol  200 mg Oral Daily  . diltiazem  120 mg Oral Daily  . latanoprost  1 drop Both Eyes QHS  . mirtazapine  15 mg Oral QHS  . multivitamin with minerals  1 tablet Oral Daily  . sodium chloride flush  10-40 mL Intracatheter Q12H   Continuous Infusions: . piperacillin-tazobactam (ZOSYN)  IV 3.375 g (10/27/18 1517)    Principal Problem:   Hemoptysis Active Problems:   CAD s/p left circumflex coronary stents 2002   Essential hypertension   CKD (chronic kidney disease) stage 3, GFR 30-59 ml/min (HCC)   A-fib (HCC)   Anemia, chronic disease   Pressure injury of skin   Squamous cell lung cancer, right ([place])   LOS: 3 days   A & P  Hemoptysis:Secondary to lung mass. Anticoagulation on hold.  PCCM consulted.  Did not recommend bronchoscopy at this time.  One episode of hemoptysis since yesterday. Hemoglobin increased to 9.1.  We will continue to monitor. Pt will start radiation therapy for palliation as per Dr. Tammi Klippel. I appreciate his help.  Worsening pneumonia:  CT chest showed worsening consolidation.  Most likely postobstructive pneumonia secondary to  lung mass.  Continue antibiotics. He also has  history of necrotizing pneumonia on previous hospitalization. MRSA PCR negative.  Vancomycin discontinued.  Continue Zosyn for now.  Squamous cell carcinoma of right lung: Oncology following. CT finding as above. Further recommendation as per oncology.CT also showed enlarging 2.9 cm left adrenal mass, probable metastatic lesion. MRI brain was negative for brain  mets. The patient was evaluated by Dr. Irene Limbo for radiation oncology. Given the patient's poor performance status he is considered a poor candidate for palliative chemotherapy or chemo-radiation. Recommendation is made for palliative care consult and radiation oncology consult for consideration of palliative RT to help with symptom control. If patient responds well to palliative radiation, and he has improvement in his functional status, it is possibly that he may be considered for palliative immunotherapy or mild chemotherapy options in the future. Pt will start radiation therapy for palliation as per Dr. Tammi Klippel. I appreciate his help.  A. fib with RVR: Currently heart rate is controlled on Cardizem.  Anticoagulation will be held due to hemoptysis. This is likely permanent.  CKD stage III: Currently kidney function at baseline. Creatinine 0.89 today.  Diastolic CHF: Currently euvolemic.  Normocytic Anemia: Most likely  secondary to chronic kidney disease/chronic blood loss. On iron supplements at home. Hemoglobin is improved at 9.1 today.  Hypercalcemia: Likely from squamous cell carcinoma.  Continue to monitor. Stable at 10.9.  Hypertension: Currently blood pressure stable.  Continue Cardizem.  Recent fall with difficulty ambulation: X-ray did not show any fracture or dislocation.  Has generalized weakness.  PT evaluated him and recommended SNF.   Nutrition Problem: Increased nutrient needs Etiology: cancer and cancer related treatments  I have seen and examined this patient myself. I have spent 34 minutes in his evaluation and care.  DVT prophylaxis: SCD Code Status: Full  Family Communication: Wife called for update Disposition Plan: PT recommends SNF. Family is reticent.  Needs at least 1 or 2 days more of IV antibiotics.  Awaiting further plan from  Radiation oncology.   Roger Babers, DO [facility] Direct contact: see [email or web address]  7PM-7AM contact night coverage as  above

## 2018-10-27 NOTE — Care Management Important Message (Signed)
Important Message  Patient Details IM Letter given to Servando Snare SW to present to the Patient Name: Roger Blackwell MRN: [id number] Date of Birth: [birth year]   Medicare Important Message Given:  Yes    Kerin Salen 10/27/2018, 10:28 AM

## 2018-10-27 NOTE — Progress Notes (Signed)
  Radiation Oncology         [phone] ________________________________  Name: Roger Blackwell MRN: [id number]  Date: 10/27/2018  DOB: 27-Dec-1935  INPATIENT  SIMULATION AND TREATMENT PLANNING NOTE    ICD-10-CM   1. Primary cancer of right lower lobe of lung (HCC)  C34.31   2. Hemoptysis  R04.2     DIAGNOSIS:  36 gentleman with hemoptysis from squamous cell carcinoma of the right lower lung  NARRATIVE:  The patient was brought to the [facility].  Identity was confirmed.  All relevant records and images related to the planned course of therapy were reviewed.  The patient freely provided informed written consent to proceed with treatment after reviewing the details related to the planned course of therapy. The consent form was witnessed and verified by the simulation staff.  Then, the patient was set-up in a stable reproducible  supine position for radiation therapy.  CT images were obtained.  Surface markings were placed.  The CT images were loaded into the planning software.  Then the target and avoidance structures were contoured.  Treatment planning then occurred.  The radiation prescription was entered and confirmed.  Then, I designed and supervised the construction of a total of 6 medically necessary complex treatment devices, including a BodyFix immobilization mold custom fitted to the patient along with 5 multileaf collimators conformally shaped radiation around the treatment target while shielding critical structures such as the heart and spinal cord maximally.  I have requested : 3D Simulation  I have requested a DVH of the following structures: Left lung, right lung, spinal cord, heart, esophagus, and target.  I have ordered:Nutrition Consult  PLAN:  The patient will receive 30 Gy in 10 fractions to provide hemostasis.  If the patient tolerates this treatment and responds, and additional consolidative course could be considered for more durable disease control in the  absence of overt metastatic disease.  ________________________________  Sheral Apley. Tammi Klippel, M.D.

## 2018-10-28 ENCOUNTER — Ambulatory Visit
Admission: RE | Admit: 2018-10-28 | Discharge: 2018-10-28 | Disposition: A | Discharge status: Home / Self Care | Payer: Medicare Other | Source: Ambulatory Visit | Attending: Radiation Oncology | Admitting: Radiation Oncology

## 2018-10-28 LAB — GLUCOSE, CAPILLARY: Glucose-Capillary: 112 mg/dL — ABNORMAL HIGH (ref 70–99)

## 2018-10-28 LAB — CREATININE, SERUM
Creatinine, Ser: 0.88 mg/dL (ref 0.61–1.24)
GFR calc Af Amer: >60 mL/min (ref >60)
GFR calc non Af Amer: >60 mL/min (ref >60)

## 2018-10-28 MED ORDER — DILTIAZEM HCL ER COATED BEADS 120 MG PO CP24
120.0000 mg | ORAL_CAPSULE | Freq: Every day | ORAL | Status: DC
  Administered 2018-10-28 – 2018-11-01 (×5): 120 mg via ORAL
  Filled 2018-10-28 (×7): qty 1

## 2018-10-28 MED ORDER — METOPROLOL TARTRATE 5 MG/5ML IV SOLN
2.5000 mg | Freq: Once | INTRAVENOUS | Status: AC
  Administered 2018-10-28: 2.5 mg via INTRAVENOUS
  Filled 2018-10-28: qty 5

## 2018-10-28 NOTE — Progress Notes (Signed)
[facility] Dept Therapy Treatment Record Phone [phone]   Radiation Therapy was administered to Roger Blackwell on: 10/28/2018  11:04 AM and was treatment # 2 out of a planned course of 10 treatments.  Radiation Treatment  1). Beam photons with 6-10 energy  2). Brachytherapy None  3). Stereotactic Radiosurgery None  4). Other Radiation None     Paulla Fore, RT (T)

## 2018-10-28 NOTE — Progress Notes (Signed)
PROGRESS NOTE  Roger Blackwell MPN:[id number] DOB: [birth year] DOA: 10/26/2018 PCP: [name], MD  Brief History   Patient is 83 year old male with history of recently diagnosed squamous cell carcinoma of the lung, diabetes mellitus, diastolic CHF, A. fib, hypertension, anemia, necrotizing pneumonia who was brought to the emergency department with complaints of hemoptysis for last 2 days.  CT chest done in the emergency department showed worsening right hilar and subcarinal mass with progressive narrowing of the bronchus and also worsening consolidation.  Patient has been started on broad-spectrum antibiotics.  Oncology and PCCM consulted. They don't feel that the patient would benefit from bronchoscopy at this time. Hemoglobin is stable.  Radiation oncology has been consulted, and radiation therapy was initiated yesterday. He had his second of a planned 10 treatments today.  Consultants  . PCCM . Radiation oncology  Procedures  . Radiation therapy   Antibiotics  . Zosyn  Subjective  The patient is resting in bed. He is notably lethargic this morning. No new complaints.  Objective   Vitals:  Vitals:   10/28/18 0756 10/28/18 1401  BP: (!) 127/56 120/75  Pulse: 96 (!) 40  Resp:  20  Temp:  97.6 F (36.4 C)  SpO2: 100% 95%   Exam:  Constitutional:  . The patient is lethargic. According to nursing, he has been confused today. No acute distress. Respiratory:  . No increased work of breathing.  . Diminished breath sounds on right base. No wheezes, rales or rhonchi are appreciated. . No tactile fremitus Cardiovascular:  . Regular rate and rhythm. . No murmurs, ectopy, or gallups. . No lateral PMI, no thrills. . No LE extremity edema   . Normal pedal pulses Abdomen:  . Abdomen is soft, non-tender, non-distended. . No hernias, masses, or organomegaly is appreciated. . Normoactive bowel sounds. Musculoskeletal:  . No cyanosis, clubbing, or edema. Skin:  . No rashes,  lesions, ulcers . palpation of skin: no induration or nodules Neurologic:  . CN 2-12 intact . Sensation all 4 extremities intact . Decreased level of consciousness, confusion today.  I have personally reviewed the following:   Today's Data  . Creatinine, Vitals  Micro Data  . COVID-19 negative . MRSA by PCR negative  Imaging  . CT chest without contrast: right sided mediastinal and subcarinal mass, adrenal mass . CXR Hazy opacificity in right lung  Scheduled Meds: . allopurinol  200 mg Oral Daily  . diltiazem  120 mg Oral Daily  . latanoprost  1 drop Both Eyes QHS  . mirtazapine  15 mg Oral QHS  . multivitamin with minerals  1 tablet Oral Daily  . sodium chloride flush  10-40 mL Intracatheter Q12H   Continuous Infusions: . piperacillin-tazobactam (ZOSYN)  IV 3.375 g (10/28/18 1527)    Principal Problem:   Hemoptysis Active Problems:   CAD s/p left circumflex coronary stents 2002   Essential hypertension   CKD (chronic kidney disease) stage 3, GFR 30-59 ml/min (HCC)   A-fib (HCC)   Anemia, chronic disease   Pressure injury of skin   Squamous cell lung cancer, right ([place])   LOS: 4 days   A & P  Hemoptysis:Secondary to lung mass. Anticoagulation on hold.  PCCM consulted.  Did not recommend bronchoscopy at this time.  One episode of hemoptysis since yesterday. Hemoglobin increased to 9.1.  We will continue to monitor. Pt has started radiation therapy for palliation as per Dr. Tammi Klippel. Today he will have received his second of a planned 10 treatments.  Delirium: Possibly due to radiation therapy. Monitor. No delirium causing meds identified.   Worsening pneumonia:  CT chest showed worsening consolidation.  Most likely postobstructive pneumonia secondary to lung mass.  Continue antibiotics. He also has  history of necrotizing pneumonia on previous hospitalization. MRSA PCR negative.  Vancomycin discontinued.  Continue Zosyn for now.  Squamous cell carcinoma of right  lung: Oncology following. CT finding as above. Further recommendation as per oncology.CT also showed enlarging 2.9 cm left adrenal mass, probable metastatic lesion. MRI brain was negative for brain mets. The patient was evaluated by Dr. Irene Limbo for radiation oncology. Given the patient's poor performance status he is considered a poor candidate for palliative chemotherapy or chemo-radiation. Recommendation is made for palliative care consult and radiation oncology consult for consideration of palliative RT to help with symptom control. If patient responds well to palliative radiation, and he has improvement in his functional status, it is possibly that he may be considered for palliative immunotherapy or mild chemotherapy options in the future. Pt has started radiation therapy for palliation as per Dr. Tammi Klippel. He will receive 10 treatments. I appreciate his help.  A. fib with RVR: This morning the patient had heart rates in the 140's then later had a 3.5 second pause. I discussed the patient with Dr. Aundra Dubin of cardiology who recommended continuing the patient on his Diltiazem and to call him again tomorrow if there are further arrhythmias.  CKD stage III: Currently kidney function at baseline. Creatinine 0.89 today.  Diastolic CHF: Currently euvolemic.  Normocytic Anemia: Most likely  secondary to chronic kidney disease/chronic blood loss. On iron supplements at home. Hemoglobin is improved at 9.1 today.  Hypercalcemia: Likely from squamous cell carcinoma.  Continue to monitor. Stable at 10.9.  Hypertension: Currently blood pressure stable.  Continue Cardizem.  Recent fall with difficulty ambulation: X-ray did not show any fracture or dislocation.  Has generalized weakness.  PT evaluated him and recommended SNF.   Nutrition Problem: Increased nutrient needs Etiology: cancer and cancer related treatments  I have seen and examined this patient myself. I have spent 40 minutes in his evaluation  and care.  DVT prophylaxis: SCD Code Status: Full  Family Communication: Wife called for update Disposition Plan: PT recommends SNF. Family is reticent.  Needs at least 1 or 2 days more of IV antibiotics.  Awaiting further plan from  Radiation oncology.   Lerlene Treadwell, DO [facility] Direct contact: see [email or web address]  7PM-7AM contact night coverage as above

## 2018-10-28 NOTE — Progress Notes (Signed)
Patient had a pause of 3.56 seconds.  Vital signs stable. RN will continue to monitor. MD paged.

## 2018-10-28 NOTE — Progress Notes (Signed)
Pt had 4.41 pause followed by HR of 32, quickly returning to 110s.

## 2018-10-29 ENCOUNTER — Inpatient Hospital Stay: Payer: Self-pay

## 2018-10-29 LAB — BASIC METABOLIC PANEL
Anion gap: 7 (ref 5–15)
BUN: 18 mg/dL (ref 8–23)
CO2: 30 mmol/L (ref 22–32)
Calcium: 10.9 mg/dL — ABNORMAL HIGH (ref 8.9–10.3)
Chloride: 104 mmol/L (ref 98–111)
Creatinine, Ser: 0.87 mg/dL (ref 0.61–1.24)
GFR calc Af Amer: >60 mL/min (ref >60)
GFR calc non Af Amer: >60 mL/min (ref >60)
Glucose, Bld: 123 mg/dL — ABNORMAL HIGH (ref 70–99)
Potassium: 4.3 mmol/L (ref 3.5–5.1)
Sodium: 141 mmol/L (ref 135–145)

## 2018-10-29 LAB — CBC WITH DIFFERENTIAL/PLATELET
Abs Immature Granulocytes: 0.07 10*3/uL (ref 0.00–0.07)
Basophils Absolute: 0.1 10*3/uL (ref 0.0–0.1)
Basophils Relative: 0 %
Eosinophils Absolute: 0.2 10*3/uL (ref 0.0–0.5)
Eosinophils Relative: 1 %
HCT: 32.2 % — ABNORMAL LOW (ref 39.0–52.0)
Hemoglobin: 9.7 g/dL — ABNORMAL LOW (ref 13.0–17.0)
Immature Granulocytes: 1 %
Lymphocytes Relative: 8 %
Lymphs Abs: 1.1 10*3/uL (ref 0.7–4.0)
MCH: 26.4 pg (ref 26.0–34.0)
MCHC: 30.1 g/dL (ref 30.0–36.0)
MCV: 87.5 fL (ref 80.0–100.0)
Monocytes Absolute: 1.1 10*3/uL — ABNORMAL HIGH (ref 0.1–1.0)
Monocytes Relative: 8 %
Neutro Abs: 11.9 10*3/uL — ABNORMAL HIGH (ref 1.7–7.7)
Neutrophils Relative %: 82 %
Platelets: 296 10*3/uL (ref 150–400)
RBC: 3.68 MIL/uL — ABNORMAL LOW (ref 4.22–5.81)
RDW: 17 % — ABNORMAL HIGH (ref 11.5–15.5)
WBC: 14.5 10*3/uL — ABNORMAL HIGH (ref 4.0–10.5)
nRBC: 0.1 % (ref 0.0–0.2)

## 2018-10-29 MED ORDER — LIP MEDEX EX OINT
TOPICAL_OINTMENT | CUTANEOUS | Status: AC
  Administered 2018-10-29: 08:00:00
  Filled 2018-10-29: qty 7

## 2018-10-29 MED ORDER — SODIUM CHLORIDE 0.9% FLUSH: 10.0000 mL | INTRAVENOUS | Status: DC | PRN

## 2018-10-29 NOTE — Progress Notes (Signed)
Pharmacy Antibiotic Note  Roger Blackwell is a 83 y.o. male with lung cancer on eliquis PTA for afib, presented to the ED on 10/17/2018 with c/o hemoptysis.  He was on Levaquin for PNA PTA.  He was transitioned to zosyn on admission.  Today, 10/29/2018: - day #6 of zosyn - scr stable  Plan: - continue zosyn 3.375 gm IV q8h (infuse over 4 hrs) - with stable renal funct, Pharmacy will sign off for abx. Re-consult [place] if need further assistance  __________________________________  Height: 6\' 3"  (190.5 cm) Weight: 205 lb 4 oz (93.1 kg) IBW/kg (Calculated) : 84.5  Temp (24hrs), Avg:97.5 F (36.4 C), Min:97.4 F (36.3 C), Max:97.6 F (36.4 C)  Recent Labs  Lab 10/24/18 1110 10/25/18 0548 10/26/18 0548 10/27/18 0415 10/28/18 0251 10/29/18 0556  WBC 11.5* 11.1* 10.5 10.5  --  14.5*  CREATININE  --  0.94 0.89 0.82 0.88 0.87    Estimated Creatinine Clearance: 80.9 mL/min (by C-G formula based on SCr of 0.87 mg/dL).    Allergies  Allergen Reactions  . Codeine Nausea And Vomiting and Other (See Comments)    Made the patient feel badly, also  . Diltiazem Nausea Only    Hallucinations, memory loss  . Ivp Dye [Iodinated Diagnostic Agents] Other (See Comments)    Blisters all over the body    Antimicrobials this admission: 6/8  Zosyn >>  6/9 Vancomycin >>6/10 PTA  6/4 Levaquin >> 6/7  Dose adjustments this admission: N/A  Microbiology results: 6/8 Covid: neg 6/4 BCx: ngtd - final 6/4 UCx: Pseudomonas & Enterococcus (both pan-sens)  Thank you for allowing pharmacy to be a part of this patient's care.  Lynelle Doctor 10/29/2018 9:16 AM

## 2018-10-29 NOTE — Progress Notes (Signed)
Pt had an episode of confusion when he woke up this evening. Telemetry, gown, bedding, and PICC were removed by the pt. Site of PICC was assessed and no bleeding noted. Pt is alert & oriented once he realized where he was. IV team notified for PICC, 8.71ml of zosyn left to administer. No other IV access at this time.

## 2018-10-29 NOTE — Evaluation (Signed)
Physical Therapy Evaluation Patient Details Name: Roger Blackwell MRN: [id number] DOB: 05/17/1936 Today's Date: 10/29/2018   History of Present Illness  83 yo male admitted to ED on 6/8 with weakness, falls, hemoptysis. Hx of stage III lung ca, necrotizing Pna, cad with coronary angioplasty, chf, dm, gout, afib, prostate ca.  Clinical Impression  Pt wanted to attempt standing today, but only able to sit for about 60 seconds.  If family cannot care for him at home then will need SNF. He states family can care for him. Per OT notes, home hospice may have been involved before.   Follow Up Recommendations Supervision/Assistance - 24 hour;SNF;Home health PT;Other (comment)(hospice?)    Equipment Recommendations  None recommended by PT    Recommendations for Other Services       Precautions / Restrictions Precautions Precautions: Fall Restrictions Weight Bearing Restrictions: No      Mobility  Bed Mobility     Rolling: Min assist   Supine to sit: Min assist Sit to supine: Min guard   General bed mobility comments: MIN A to get to EOB.  Sat for 1 minute to make sure he wasn't dizzy and then pt abruptly returned supine due to fatigue. HR fluctuating throughout session.   Transfers                 General transfer comment: Pt had wanted to attempt standing, but was unable to try.  Ambulation/Gait                Stairs            Wheelchair Mobility    Modified Rankin (Stroke Patients Only)       Balance   Sitting-balance support: Feet supported Sitting balance-Leahy Scale: Fair                                       Pertinent Vitals/Pain Pain Assessment: No/denies pain    Home Living                        Prior Function                 Hand Dominance        Extremity/Trunk Assessment                Communication      Cognition Arousal/Alertness: Awake/alert Behavior During Therapy: WFL for  tasks assessed/performed Overall Cognitive Status: No family/caregiver present to determine baseline cognitive functioning                                        General Comments      Exercises General Exercises - Lower Extremity Ankle Circles/Pumps: AROM;Both;20 reps;Supine   Assessment/Plan    PT Assessment    PT Problem List         PT Treatment Interventions      PT Goals (Current goals can be found in the Care Plan section)  Acute Rehab PT Goals Patient Stated Goal: To try and see if he can stand up. Potential to Achieve Goals: Fair    Frequency Min 3X/week   Barriers to discharge        Co-evaluation               AM-PAC PT "6  Clicks" Mobility  Outcome Measure Help needed turning from your back to your side while in a flat bed without using bedrails?: A Little Help needed moving from lying on your back to sitting on the side of a flat bed without using bedrails?: A Little Help needed moving to and from a bed to a chair (including a wheelchair)?: A Lot Help needed standing up from a chair using your arms (e.g., wheelchair or bedside chair)?: A Lot Help needed to walk in hospital room?: A Lot Help needed climbing 3-5 steps with a railing? : A Lot 6 Click Score: 14    End of Session   Activity Tolerance: Patient limited by fatigue     PT Visit Diagnosis: Muscle weakness (generalized) (M62.81);Other abnormalities of gait and mobility (R26.89)    Time: 0525-9102 PT Time Calculation (min) (ACUTE ONLY): 18 min   Charges:     PT Treatments $Therapeutic Activity: 8-22 mins        Mikhala Kenan L. Tamala Julian, [place] Pager [phone] 10/29/2018   Galen Manila 10/29/2018, 4:23 PM

## 2018-10-29 NOTE — Progress Notes (Signed)
PROGRESS NOTE  Roger Blackwell OHY:[id number] DOB: 14-Apr-1936 DOA: 10/17/2018 PCP: [name], MD  Brief History   Patient is 83 year old male with history of recently diagnosed squamous cell carcinoma of the lung, diabetes mellitus, diastolic CHF, A. fib, hypertension, anemia, necrotizing pneumonia who was brought to the emergency department with complaints of hemoptysis for last 2 days.  CT chest done in the emergency department showed worsening right hilar and subcarinal mass with progressive narrowing of the bronchus and also worsening consolidation.  Patient has been started on broad-spectrum antibiotics.  Oncology and PCCM consulted. They don't feel that the patient would benefit from bronchoscopy at this time. Hemoglobin is stable.  Radiation oncology has been consulted, and radiation therapy was initiated yesterday. He had his second of a planned 10 treatments yesterday. The mass is inpinging on the patient's right main stem bronchus.  Consultants  . PCCM . Radiation oncology  Procedures  . Radiation therapy   Antibiotics  . Zosyn  Subjective  The patient is more alert this morning. No new complaints.  Objective   Vitals:  Vitals:   10/29/18 0630 10/29/18 1321  BP: 108/67 109/69  Pulse: 75 75  Resp: 18 16  Temp: (!) 97.4 F (36.3 C) 98 F (36.7 C)  SpO2: 94% 95%   Exam:  Constitutional:  . The patient is awake and alert. He was confused this morning according to nursing. No new complaints. Respiratory:  . No increased work of breathing.  . Diminished breath sounds on right base. No wheezes, rales or rhonchi are appreciated. . No tactile fremitus Cardiovascular:  . Regular rate and rhythm. . No murmurs, ectopy, or gallups. . No lateral PMI, no thrills. . No LE extremity edema   . Normal pedal pulses Abdomen:  . Abdomen is soft, non-tender, non-distended. . No hernias, masses, or organomegaly is appreciated. . Normoactive bowel sounds. Musculoskeletal:  .  No cyanosis, clubbing, or edema. Skin:  . No rashes, lesions, ulcers . palpation of skin: no induration or nodules Neurologic:  . CN 2-12 intact . Sensation all 4 extremities intact . Decreased level of consciousness, confusion today.  I have personally reviewed the following:   Today's Data  . Creatinine, Vitals  Micro Data  . COVID-19 negative . MRSA by PCR negative  Imaging  . CT chest without contrast: right sided mediastinal and subcarinal mass impinging on right main stem bronchus, adrenal mass . CXR Hazy opacificity in right lung  Scheduled Meds: . allopurinol  200 mg Oral Daily  . diltiazem  120 mg Oral Daily  . latanoprost  1 drop Both Eyes QHS  . mirtazapine  15 mg Oral QHS  . multivitamin with minerals  1 tablet Oral Daily  . sodium chloride flush  10-40 mL Intracatheter Q12H   Continuous Infusions: . piperacillin-tazobactam (ZOSYN)  IV 3.375 g (10/29/18 0943)    Principal Problem:   Hemoptysis Active Problems:   CAD s/p left circumflex coronary stents 2002   Essential hypertension   CKD (chronic kidney disease) stage 3, GFR 30-59 ml/min (HCC)   A-fib (HCC)   Anemia, chronic disease   Pressure injury of skin   Squamous cell lung cancer, right ([place])   LOS: 5 days   A & P  Hemoptysis:Secondary to lung mass. Anticoagulation on hold.  PCCM consulted.  Did not recommend bronchoscopy at this time.  One episode of hemoptysis since yesterday. Hemoglobin increased to 9.1.  We will continue to monitor. Pt has started radiation therapy for palliation as per  Dr. Tammi Klippel. Today he will have received his second of a planned 10 treatments.   Delirium: Some confusion upon awakening this morning, but not confused on my visit. Pt appears to be improving.  Worsening pneumonia:  CT chest showed worsening consolidation.  Most likely postobstructive pneumonia secondary to lung mass.  Continue antibiotics. He also has  history of necrotizing pneumonia on previous  hospitalization. MRSA PCR negative.  Vancomycin discontinued.  Continue Zosyn for now.  Squamous cell carcinoma of right lung: Oncology following. CT finding as above. Further recommendation as per oncology.CT also showed enlarging 2.9 cm left adrenal mass, probable metastatic lesion. MRI brain was negative for brain mets. The patient was evaluated by Dr. Irene Limbo for radiation oncology. Given the patient's poor performance status he is considered a poor candidate for palliative chemotherapy or chemo-radiation. Recommendation is made for palliative care consult and radiation oncology consult for consideration of palliative RT to help with symptom control. If patient responds well to palliative radiation, and he has improvement in his functional status, it is possibly that he may be considered for palliative immunotherapy or mild chemotherapy options in the future. This is especially important as the mass is impinging on the right main stem bronchus. Pt has started radiation therapy for palliation as per Dr. Tammi Klippel. He will receive 10 treatments. I appreciate his help.  A. fib with RVR: This morning the patient had heart rates in the 140's then later had a 3.5 second pause. I discussed the patient with Dr. Aundra Dubin of cardiology who recommended continuing the patient on his Diltiazem and to call him again tomorrow if there are further arrhythmias.  CKD stage III: Currently kidney function at baseline. Creatinine 0.89 today.  Diastolic CHF: Currently euvolemic.  Normocytic Anemia: Most likely  secondary to chronic kidney disease/chronic blood loss. On iron supplements at home. Hemoglobin is improved at 9.1 today.  Hypercalcemia: Likely from squamous cell carcinoma.  Continue to monitor. Stable at 10.9.  Hypertension: Currently blood pressure stable.  Continue Cardizem.  Recent fall with difficulty ambulation: X-ray did not show any fracture or dislocation.  Has generalized weakness.  PT evaluated  him and recommended SNF.   Nutrition Problem: Increased nutrient needs Etiology: cancer and cancer related treatments  I have seen and examined this patient myself. I have spent 33 minutes in his evaluation and care.  DVT prophylaxis: SCD Code Status: Full  Family Communication: Wife called for update Disposition Plan: PT recommends SNF. Family is reticent. Pt is receiving radiation therapy as inpatient. 10 treatments planned. 2 completed.   Samiel Peel, DO [facility] Direct contact: see [email or web address]  7PM-7AM contact night coverage as above

## 2018-10-29 NOTE — Progress Notes (Signed)
Notified of 2.33 second pause with HR of 33. HR went back up to 89. Pt is asymptomatic.

## 2018-10-29 NOTE — Progress Notes (Signed)
Occupational Therapy Treatment Patient Details Name: Roger Blackwell MRN: [id number] DOB: 1936-04-30 Today's Date: 10/29/2018    History of present illness 83 yo male admitted to ED on 6/8 with weakness, falls, hemoptysis. Hx of stage III lung ca, necrotizing Pna, cad with coronary angioplasty, chf, dm, gout, afib, prostate ca.   OT comments  PATIENT REFUSED TO SIT EOB OR GET OOB TODAY. PATIENT WAS AGREEABLE TO TX IN BED. PATIENT REQUIRED CUES AND ENCOURAGEMENT TO PERFORM ADLS IN [place]. PATIENT TO BE FOLLOWED BY ACUTE OT.   Follow Up Recommendations       Equipment Recommendations       Recommendations for Other Services      Precautions / Restrictions Precautions Precautions: Fall       Mobility Bed Mobility     Rolling: Min assist            Transfers                      Balance                                           ADL either performed or assessed with clinical judgement   ADL   Eating/Feeding: Set up   Grooming: Wash/dry hands;Wash/dry face;Oral care;Supervision/safety;Set up           Upper Body Dressing : Moderate assistance;Cueing for sequencing                     General ADL Comments: PATIENT REFUSED TO GET OOB OR TO SIT EOB TODAY.      Vision       Perception     Praxis      Cognition Arousal/Alertness: Awake/alert Behavior During Therapy: WFL for tasks assessed/performed Overall Cognitive Status: No family/caregiver present to determine baseline cognitive functioning                                          Exercises     Shoulder Instructions       General Comments      Pertinent Vitals/ Pain       Pain Assessment: No/denies pain  Home Living                                          Prior Functioning/Environment              Frequency           Progress Toward Goals  OT Goals(current goals can now be found in the care plan  section)  Progress towards OT goals: Progressing toward goals  Acute Rehab OT Goals Patient Stated Goal: I WANT TO GET OUT OF HERE  Plan Discharge plan remains appropriate    Co-evaluation                 AM-PAC OT "6 Clicks" Daily Activity     Outcome Measure   Help from another person eating meals?: None Help from another person taking care of personal grooming?: A Little Help from another person toileting, which includes using toliet, bedpan, or urinal?: A Lot Help from another person bathing (including washing, rinsing, drying)?:  A Lot Help from another person to put on and taking off regular upper body clothing?: A Lot Help from another person to put on and taking off regular lower body clothing?: A Lot 6 Click Score: 15    End of Session        Activity Tolerance Patient limited by fatigue   Patient Left in bed;with call bell/phone within reach;with bed alarm set   Nurse Communication (OK THERAPY)        Time: 4782-9562 OT Time Calculation (min): 33 min  Charges: OT General Charges $OT Visit: 1 Visit OT Treatments $Self Care/Home Management : 13-08 mins  6 CLICKS  Roger Blackwell 10/29/2018, 8:07 AM

## 2018-10-30 ENCOUNTER — Ambulatory Visit
Admission: RE | Admit: 2018-10-30 | Discharge: 2018-10-30 | Disposition: A | Discharge status: Home / Self Care | Payer: Medicare Other | Source: Ambulatory Visit | Attending: Radiation Oncology | Admitting: Radiation Oncology

## 2018-10-30 ENCOUNTER — Ambulatory Visit: Payer: Medicare Other | Admitting: Radiation Oncology

## 2018-10-30 ENCOUNTER — Telehealth: Payer: Self-pay | Admitting: Radiation Oncology

## 2018-10-30 DIAGNOSIS — L89302 Pressure ulcer of unspecified buttock, stage 2: Secondary | ICD-10-CM

## 2018-10-30 DIAGNOSIS — C3491 Malignant neoplasm of unspecified part of right bronchus or lung: Secondary | ICD-10-CM

## 2018-10-30 DIAGNOSIS — I10 Essential (primary) hypertension: Secondary | ICD-10-CM

## 2018-10-30 NOTE — Progress Notes (Signed)
Nutrition Follow-up  INTERVENTION:   -Patient to request Ensure supplements from unit as desired -Continue Magic cup BID with meals, each supplement provides 290 kcal and 9 grams of protein -Continue Multivitamin with minerals daily  NUTRITION DIAGNOSIS:   Increased nutrient needs related to cancer and cancer related treatments as evidenced by estimated needs.  Ongoing.  GOAL:   Patient will meet greater than or equal to 90% of their needs  Progressing.  MONITOR:   PO intake, Supplement acceptance, Labs, Weight trends, I & O's, Skin  ASSESSMENT:   83 year old male w/ known and untreated to date stage III squamous cell lung cancer, admitted w/ post-obstructive necrotizing PNA on 6/8 w/ associated hemoptysis.   **RD working remotely**  Patient has been consuming 10-50% of meals over the past 24 hours. Pt has started palliative radiation. Will continue interventions above.   Per weight records, weight has increased since 6/8. Per I/O's: +755 ml since admit.  Labs reviewed. Medications: Remeron tablet daily, Multivitamin with minerals daily  Diet Order:   Diet Order            Diet Heart Room service appropriate? Yes; Fluid consistency: Thin  Diet effective now              EDUCATION NEEDS:   Education needs have been addressed  Skin:  Skin Assessment: Skin Integrity Issues: Skin Integrity Issues:: Stage II Stage II: mid sacrum  Last BM:  6/11  Height:   Ht Readings from Last 1 Encounters:  10/22/2018 6\' 3"  (1.905 m)    Weight:   Wt Readings from Last 1 Encounters:  10/29/18 93.1 kg    Ideal Body Weight:  89.1 kg  BMI:  Body mass index is 25.65 kg/m.  Estimated Nutritional Needs:   Kcal:  2500-2700  Protein:  125-135g  Fluid:  2.5L/day  Clayton Bibles, MS, RD, LDN [place] Dietitian Pager: [phone] After Hours Pager: [phone]

## 2018-10-30 NOTE — Progress Notes (Signed)
PROGRESS NOTE    Roger Blackwell  YNW:[id number] DOB: [birth year] DOA: 10/18/2018 PCP: [name], MD    Brief Narrative:  83 year old male who presented with hemoptysis.  He does have significant past medical history for type 2 diabetes mellitus, diastolic heart failure, atrial fibrillation, hypertension, chronic anemia, and squamous cell carcinoma of the lung.  Patient reported 2 days of hemoptysis, dyspnea, fevers or chills.  On his initial physical examination blood pressure was 101/60, heart rate 110, respiratory rate 22, temperature 98.6, oxygen saturation 94 to 98%, his lungs had no rhonchi or rails, heart S1-S2 present and rhythmic, abdomen was soft nontender, no lower extremity edema.  CT chest with worsening right hilar and subcarinal mass with progressive narrowing of the distal right mainstem bronchus.  Progressive consolidation through basilar segments of the right lower lobe.  Enlarging 2.9 cm left adrenal mass.  Chest radiograph with hyperinflation, right base opacity with associated pleural effusion.  Patient was admitted to the hospital with a working diagnosis of hemoptysis related to worsening squamous cell carcinoma of the lung.  Assessment & Plan:   Principal Problem:   Hemoptysis Active Problems:   CAD s/p left circumflex coronary stents 2002   Essential hypertension   CKD (chronic kidney disease) stage 3, GFR 30-59 ml/min (HCC)   A-fib (HCC)   Anemia, chronic disease   Pressure injury of skin   Squamous cell lung cancer, right ([place])   1. Squamous cell lung cancer, complicated with distal right main bronchus stenosis and postobstructive pneumonia. Patient continue to have dyspnea and generalized weakness, no hemoptysis. Oxygen saturation has been 95% on room air. Wbc at 14.5. Not candidate for bronchoscopy, continue with palliative radiation therapy. Antibiotic therapy with Zoysin. Out of bed to chair as tid and physical therapy evaluation.  2, Atrial fibrillation  with RVR. Improved rate with oral diltiazem, continue telemetry monitoring. Holding on anticoagulation due to bleeding at the right main bronchus lesion.   3. Depression. Continue mirtazapine.   4. Gout. No acute exacerbation, continue allopurinol.   5. Stage 2 sacreal ulcer present on admission, continue local wound care.   DVT prophylaxis: scd  Code Status: full Family Communication: no family at the bedside  Disposition Plan/ discharge barriers: possible dc in am.   Body mass index is 25.65 kg/m. Malnutrition Type:  Nutrition Problem: Increased nutrient needs Etiology: cancer and cancer related treatments   Malnutrition Characteristics:  Signs/Symptoms: estimated needs   Nutrition Interventions:  Interventions: MVI, Magic cup, Education  RN Pressure Injury Documentation: Pressure Injury 11/02/2018 Sacrum Mid Stage II -  Partial thickness loss of dermis presenting as a shallow open ulcer with a red, pink wound bed without slough. pink (Active)  11/02/2018 2230  Location: Sacrum  Location Orientation: Mid  Staging: Stage II -  Partial thickness loss of dermis presenting as a shallow open ulcer with a red, pink wound bed without slough.  Wound Description (Comments): pink  Present on Admission: Yes     Consultants:   Pulmonary   Oncology   Procedures:     Antimicrobials:    IV zosyn    Subjective: Patient is feeling better but not yet back to baseline, continue to have dyspnea and weakness, no nausea or vomiting, no chest pain. No further hemoptysis, tolerated radiation well.   Objective: Vitals:   10/29/18 2000 10/29/18 2117 10/30/18 0651 10/30/18 1010  BP:  104/73 111/63 112/60  Pulse: 76 76 (!) 50 78  Resp:  20 18   Temp:  97.8  F (36.6 C) 97.8 F (36.6 C)   TempSrc:  Oral Oral   SpO2:  92% 94% 95%  Weight:      Height:        Intake/Output Summary (Last 24 hours) at 10/30/2018 1421 Last data filed at 10/30/2018 0830 Gross per 24 hour  Intake  360 ml  Output 200 ml  Net 160 ml   Filed Weights   10/27/18 0655 10/28/18 0611 10/29/18 0630  Weight: 93.5 kg 95.5 kg 93.1 kg    Examination:   General: deconditioned  Neurology: Awake and alert, non focal  E ENT: mild pallor, no icterus, oral mucosa moist Cardiovascular: No JVD. S1-S2 present, rhythmic, no gallops, rubs, or murmurs. No lower extremity edema. Pulmonary: positive breath sounds bilaterally, decreased air movement, no wheezing, rhonchi or rales. Gastrointestinal. Abdomen with no organomegaly, non tender, no rebound or guarding Skin. No rashes Musculoskeletal: no joint deformities     Data Reviewed: I have personally reviewed following labs and imaging studies  CBC: Recent Labs  Lab 11/02/2018 1641  10/24/18 1110 10/25/18 0548 10/26/18 0548 10/27/18 0415 10/29/18 0556  WBC 13.6*   < > 11.5* 11.1* 10.5 10.5 14.5*  NEUTROABS 10.9*  --   --  8.5* 8.1* 7.9* 11.9*  HGB 9.1*   < > 9.0* 8.5* 9.1* 9.2* 9.7*  HCT 29.8*   < > 29.6* 28.2* 29.7* 30.7* 32.2*  MCV 86.9   < > 88.1 87.9 87.4 86.7 87.5  PLT 298   < > 256 244 259 262 296   < > = values in this interval not displayed.   Basic Metabolic Panel: Recent Labs  Lab 11/10/2018 1641 10/18/2018 2008 10/24/18 0510 10/25/18 0548 10/26/18 0548 10/27/18 0415 10/28/18 0251 10/29/18 0556  NA 134*  --  138 138  --   --   --  141  K >7.5* 4.2 4.1 4.0  --   --   --  4.3  CL 104  --  106 105  --   --   --  104  CO2 24  --  23 26  --   --   --  30  GLUCOSE 91  --  93 110*  --   --   --  123*  BUN 24*  --  24* 21  --   --   --  18  CREATININE 1.39*  --  1.06 0.94 0.89 0.82 0.88 0.87  CALCIUM 10.5*  --  10.9* 10.9*  --   --   --  10.9*   GFR: Estimated Creatinine Clearance: 80.9 mL/min (by C-G formula based on SCr of 0.87 mg/dL). Liver Function Tests: Recent Labs  Lab 10/24/18 0510  AST 14*  ALT 14  ALKPHOS 62  BILITOT 0.6  PROT 6.4*  ALBUMIN 2.3*   No results for input(s): LIPASE, AMYLASE in the last 168  hours. No results for input(s): AMMONIA in the last 168 hours. Coagulation Profile: Recent Labs  Lab 11/05/2018 1641  INR 1.7*   Cardiac Enzymes: Recent Labs  Lab 10/24/18 0510 10/24/18 1110 10/24/18 1751  TROPONINI <0.03 <0.03 <0.03   BNP (last 3 results) No results for input(s): PROBNP in the last 8760 hours. HbA1C: No results for input(s): HGBA1C in the last 72 hours. CBG: Recent Labs  Lab 10/28/18 1633  GLUCAP 112*   Lipid Profile: No results for input(s): CHOL, HDL, LDLCALC, TRIG, CHOLHDL, LDLDIRECT in the last 72 hours. Thyroid Function Tests: No results for input(s): TSH, T4TOTAL, FREET4,  T3FREE, THYROIDAB in the last 72 hours. Anemia Panel: No results for input(s): VITAMINB12, FOLATE, FERRITIN, TIBC, IRON, RETICCTPCT in the last 72 hours.    Radiology Studies: I have reviewed all of the imaging during this hospital visit personally     Scheduled Meds: . allopurinol  200 mg Oral Daily  . diltiazem  120 mg Oral Daily  . latanoprost  1 drop Both Eyes QHS  . mirtazapine  15 mg Oral QHS  . multivitamin with minerals  1 tablet Oral Daily  . sodium chloride flush  10-40 mL Intracatheter Q12H   Continuous Infusions: . piperacillin-tazobactam (ZOSYN)  IV 3.375 g (10/30/18 0847)     LOS: 6 days         Gerome Apley, MD

## 2018-10-30 NOTE — TOC Progression Note (Signed)
Transition of Care [facility]) - Progression Note    Patient Details  Name: Tytan Sandate MRN: [id number] Date of Birth: 1936-04-30  [facility] CM/SW [place], [place] Phone Number: 10/30/2018, 10:24 AM  Clinical Narrative:    CSW discussed disposition planning with patient spouse. Patient spouse is concern about the patient going to SNF, she is afraid of the patient "catching COVID." CSW explain PT recommendations and patient therapy session "able to sit up for 60 seconds." Spouse reports the patient was receiving physical therapy through [facility] 2x's a week. Spouse reports she is the patient primary caretaker and will need help with his care, and taking him to his radiation appointments at the [facility]. Spouse requested time to think about SNF placement. CSW will continue to follow this patient.     Expected Discharge Plan: Home w Hospice Care Barriers to Discharge: Continued Medical Work up  Expected Discharge Plan and Services Expected Discharge Plan: [place] Acute Care Choice: Hospice Living arrangements for the past 2 months: Single Family Home                                       Social Determinants of Health (SDOH) Interventions    Readmission Risk Interventions Readmission Risk Prevention Plan 10/03/2018  Transportation Screening Complete  PCP or Specialist Appt within 3-5 Days Not Complete  Not Complete comments not yet ready for d/c  HRI or [place] Not Complete  HRI or Home Care Consult comments no needs at this time  Social Work Consult for [place] Planning/Counseling Not Complete  SW consult not completed comments no needs at this time  Palliative Care Screening Not Applicable  Medication Review Press photographer) Complete  Some recent data might be hidden

## 2018-10-30 NOTE — Progress Notes (Signed)
Patient went down for radiation treatment.

## 2018-10-30 NOTE — Care Management Important Message (Signed)
Important Message  Patient Details IM Letter given to Kathrin Greathouse SW to present to the Patient Name: Roger Blackwell MRN: [id number] Date of Birth: 1936-04-30   Medicare Important Message Given:  Yes    Kerin Salen 10/30/2018, 12:03 PM

## 2018-10-30 NOTE — Telephone Encounter (Signed)
Phoned wife back to communicate that message was received. No answer. No option to leave a message. Will attempt to reach wife again at a later time and explain Dr. Tammi Klippel will call her on Friday after her husband has received a week of radiation therapy.

## 2018-10-30 NOTE — Telephone Encounter (Signed)
-----   Message from Tyler Pita, MD sent at 10/30/2018  1:57 PM EDT ----- Regarding: RE: Wife requesting update I called her Saturday and told her I would call her again Friday.  He should probably be switched to me. ----- Message ----- From: Heywood Footman, RN Sent: 10/30/2018  11:25 AM EDT To: Tyler Pita, MD Subject: FW: Wife requesting update                     Is this patient ours? I thought you were covering [place] for the simulation only. If so, his wife is requesting an update on his status. I am glad to call her. What key points would you like me to share?  Sam ----- Message ----- From: Gery Pray, MD Sent: 10/30/2018  10:47 AM EDT To: Heywood Footman, RN, # Subject: RE: Wife requesting update                     Sam,  I think this patient is Dr. Johny Shears now.  He was simulated by him Friday.  Thanks, jk ----- Message ----- From: Heywood Footman, RN Sent: 10/30/2018   8:55 AM EDT To: Gery Pray, MD, Loma Sousa, RN Subject: Wife requesting update                         Patient's wife is requesting an update on her husband's condition. She can be reached at [phone]. If I can help in anyway let me know please.   Sam

## 2018-10-31 ENCOUNTER — Ambulatory Visit
Admission: RE | Admit: 2018-10-31 | Discharge: 2018-10-31 | Disposition: A | Discharge status: Home / Self Care | Payer: Medicare Other | Source: Ambulatory Visit | Attending: Radiation Oncology | Admitting: Radiation Oncology

## 2018-10-31 ENCOUNTER — Telehealth: Payer: Self-pay | Admitting: Radiation Oncology

## 2018-10-31 DIAGNOSIS — L8942 Pressure ulcer of contiguous site of back, buttock and hip, stage 2: Secondary | ICD-10-CM

## 2018-10-31 DIAGNOSIS — I2510 Atherosclerotic heart disease of native coronary artery without angina pectoris: Secondary | ICD-10-CM

## 2018-10-31 NOTE — Progress Notes (Signed)
[facility] Therapy Treatment Record Phone [phone]   Radiation Therapy was administered to [name] on: 10/31/2018  11:17 AM and was treatment # 4 out of a planned course of 10 treatments.  Radiation Treatment  1). Beam photons with 6-10 energy  2). Brachytherapy None  3). Stereotactic Radiosurgery None  4). Other Radiation None     Jacques Earthly, RT (T)

## 2018-10-31 NOTE — Progress Notes (Addendum)
PROGRESS NOTE    Roger Blackwell  ATF:[id number] DOB: [birth year] DOA: 11/12/2018 PCP: [name], MD    Brief Narrative:  83 year old male who presented with hemoptysis.  He does have significant past medical history for type 2 diabetes mellitus, diastolic heart failure, atrial fibrillation, hypertension, chronic anemia, and squamous cell carcinoma of the lung.  Patient reported 2 days of hemoptysis, dyspnea, fevers or chills.  On his initial physical examination blood pressure was 101/60, heart rate 110, respiratory rate 22, temperature 98.6, oxygen saturation 94 to 98%, his lungs had no rhonchi or rales, heart S1-S2 present and rhythmic, abdomen was soft nontender, no lower extremity edema.  CT chest with worsening right hilar and subcarinal mass with progressive narrowing of the distal right mainstem bronchus.  Progressive consolidation through basilar segments of the right lower lobe.  Enlarging 2.9 cm left adrenal mass.  Chest radiograph with hyperinflation, right base opacity with associated pleural effusion.  Patient was admitted to the hospital with a working diagnosis of hemoptysis related to worsening squamous cell carcinoma of the lung   Assessment & Plan:   Principal Problem:   Hemoptysis Active Problems:   CAD s/p left circumflex coronary stents 2002   Essential hypertension   CKD (chronic kidney disease) stage 3, GFR 30-59 ml/min (HCC)   A-fib (HCC)   Anemia, chronic disease   Pressure injury of skin   Squamous cell lung cancer, right ([place])   1. Squamous cell lung cancer, complicated with distal right main bronchus stenosis and postobstructive pneumonia. Improved dyspnea, tolerating well radiation therapy, continue to have generalized weakness, no further hemoptysis.wbc at 14,5 and oxygenating 95% on room air. Continue with antibiotic therapy with Zosyn #7/8.   2, Atrial fibrillation with RVR. Continue with oral diltiazem for rate control, continue telemetry monitoring.  HR 58 to 75. No anticoagulation due to risk for recurrent hemoptysis.    3. Depression with metabolic encephalpathy. On mirtazapine. Patient very weak and deconditioned. Patient pulled his IV, intermittent confusion, did required mittens both hands during the day today.   4. Gout. No clinical signs of acute exacerbation, continue allopurinol.   5. Stage 2 sacreal ulcer present on admission,Local wound care.   DVT prophylaxis: scd  Code Status: full Family Communication: no family at the bedside  Disposition Plan/ discharge barriers: patient is very weak and deconditioned, will need assistance to her wife at discharge. Will discuss with her options.   Body mass index is 25.65 kg/m. Malnutrition Type:  Nutrition Problem: Increased nutrient needs Etiology: cancer and cancer related treatments   Malnutrition Characteristics:  Signs/Symptoms: estimated needs   Nutrition Interventions:  Interventions: MVI, Magic cup, Education  RN Pressure Injury Documentation: Pressure Injury 10/25/2018 Sacrum Mid Stage II -  Partial thickness loss of dermis presenting as a shallow open ulcer with a red, pink wound bed without slough. pink (Active)  10/19/2018 2230  Location: Sacrum  Location Orientation: Mid  Staging: Stage II -  Partial thickness loss of dermis presenting as a shallow open ulcer with a red, pink wound bed without slough.  Wound Description (Comments): pink  Present on Admission: Yes     Consultants:   Pulmonary   Radiation oncology   Procedures:     Antimicrobials:   IV zosyn.     Subjective: Patient pulled his mid line IV, has intermittent confusion, placed on bilateral mittens, dyspnea has improved, no recurrent hemoptysis, no nausea or vomiting. Continue to be very weak and deconditioned.   Objective: Vitals:   10/30/18  2039 10/30/18 2042 10/31/18 0515 10/31/18 1402  BP: (!) 98/54 (!) 104/51 92/65 114/69  Pulse: 76 92 75 (!) 58  Resp: 16  20 16   Temp:  98.7 F (37.1 C)  (!) 97.5 F (36.4 C) 98.6 F (37 C)  TempSrc: Oral  Oral Oral  SpO2: 92% 96% 98% 95%  Weight:      Height:        Intake/Output Summary (Last 24 hours) at 10/31/2018 1423 Last data filed at 10/31/2018 0845 Gross per 24 hour  Intake 530 ml  Output -  Net 530 ml   Filed Weights   10/27/18 0655 10/28/18 0611 10/29/18 0630  Weight: 93.5 kg 95.5 kg 93.1 kg    Examination:   General: deconditioned  Neurology: Awake and alert, non focal, no confusion or agitation.   E ENT: no pallor, no icterus, oral mucosa moist Cardiovascular: No JVD. S1-S2 present, rhythmic, no gallops, rubs, or murmurs. No lower extremity edema. Pulmonary: positive breath sounds bilaterally,positive air movement, no wheezing, scattered bilateral rhonchi, no rales. Gastrointestinal. Abdomen with no organomegaly, non tender, no rebound or guarding Skin. No rashes Musculoskeletal: no joint deformities     Data Reviewed: I have personally reviewed following labs and imaging studies  CBC: Recent Labs  Lab 10/25/18 0548 10/26/18 0548 10/27/18 0415 10/29/18 0556  WBC 11.1* 10.5 10.5 14.5*  NEUTROABS 8.5* 8.1* 7.9* 11.9*  HGB 8.5* 9.1* 9.2* 9.7*  HCT 28.2* 29.7* 30.7* 32.2*  MCV 87.9 87.4 86.7 87.5  PLT 244 259 262 562   Basic Metabolic Panel: Recent Labs  Lab 10/25/18 0548 10/26/18 0548 10/27/18 0415 10/28/18 0251 10/29/18 0556  NA 138  --   --   --  141  K 4.0  --   --   --  4.3  CL 105  --   --   --  104  CO2 26  --   --   --  30  GLUCOSE 110*  --   --   --  123*  BUN 21  --   --   --  18  CREATININE 0.94 0.89 0.82 0.88 0.87  CALCIUM 10.9*  --   --   --  10.9*   GFR: Estimated Creatinine Clearance: 80.9 mL/min (by C-G formula based on SCr of 0.87 mg/dL). Liver Function Tests: No results for input(s): AST, ALT, ALKPHOS, BILITOT, PROT, ALBUMIN in the last 168 hours. No results for input(s): LIPASE, AMYLASE in the last 168 hours. No results for input(s): AMMONIA in the  last 168 hours. Coagulation Profile: No results for input(s): INR, PROTIME in the last 168 hours. Cardiac Enzymes: Recent Labs  Lab 10/24/18 1751  TROPONINI <0.03   BNP (last 3 results) No results for input(s): PROBNP in the last 8760 hours. HbA1C: No results for input(s): HGBA1C in the last 72 hours. CBG: Recent Labs  Lab 10/28/18 1633  GLUCAP 112*   Lipid Profile: No results for input(s): CHOL, HDL, LDLCALC, TRIG, CHOLHDL, LDLDIRECT in the last 72 hours. Thyroid Function Tests: No results for input(s): TSH, T4TOTAL, FREET4, T3FREE, THYROIDAB in the last 72 hours. Anemia Panel: No results for input(s): VITAMINB12, FOLATE, FERRITIN, TIBC, IRON, RETICCTPCT in the last 72 hours.    Radiology Studies: I have reviewed all of the imaging during this hospital visit personally     Scheduled Meds: . allopurinol  200 mg Oral Daily  . diltiazem  120 mg Oral Daily  . latanoprost  1 drop Both Eyes QHS  .  mirtazapine  15 mg Oral QHS  . multivitamin with minerals  1 tablet Oral Daily  . sodium chloride flush  10-40 mL Intracatheter Q12H   Continuous Infusions: . piperacillin-tazobactam (ZOSYN)  IV 3.375 g (10/31/18 0744)     LOS: 7 days        Gracyn Allor Gerome Apley, MD

## 2018-10-31 NOTE — Telephone Encounter (Signed)
Received voicemail message from patient's wife, Peter Congo, that she has questions. Phoned back to inquire at number provided. No answer. Left voicemail message with my direct number. Awaiting return call.

## 2018-10-31 NOTE — Progress Notes (Signed)
Patient pulled his midline IV out. Mittens was appiled. MD was aware at bedside.

## 2018-10-31 NOTE — Progress Notes (Signed)
  Radiation Oncology         [phone]) [phone] ________________________________  Name: Khary Schaben MRN: [id number]  Date: 10/26/2018  DOB: [birth year]  Chart Note:  I received a message to call this patient's wife to update her.  I explained, that we have completed 4 out of 10 treatments for his lung cancer to palliate hemoptysis and improve his breathing.  She had questions about his disposition.  She understands that he is too weak to come home and may benefit from physical therapy and possible SNF placement for rehab.  She would appreciate if the hospitalists and care management team would call her to provide daily updates.   She can be reached at [phone].       Sheral Apley Tammi Klippel, M.D.

## 2018-11-01 ENCOUNTER — Ambulatory Visit
Admission: RE | Admit: 2018-11-01 | Discharge: 2018-11-01 | Disposition: A | Discharge status: Home / Self Care | Payer: Medicare Other | Source: Ambulatory Visit | Attending: Radiation Oncology | Admitting: Radiation Oncology

## 2018-11-01 DIAGNOSIS — N183 Chronic kidney disease, stage 3 (moderate): Secondary | ICD-10-CM

## 2018-11-01 DIAGNOSIS — J181 Lobar pneumonia, unspecified organism: Secondary | ICD-10-CM

## 2018-11-01 LAB — CBC WITH DIFFERENTIAL/PLATELET
Abs Immature Granulocytes: 0.08 10*3/uL — ABNORMAL HIGH (ref 0.00–0.07)
Basophils Absolute: 0 10*3/uL (ref 0.0–0.1)
Basophils Relative: 0 %
Eosinophils Absolute: 0.3 10*3/uL (ref 0.0–0.5)
Eosinophils Relative: 2 %
HCT: 30.2 % — ABNORMAL LOW (ref 39.0–52.0)
Hemoglobin: 9.3 g/dL — ABNORMAL LOW (ref 13.0–17.0)
Immature Granulocytes: 1 %
Lymphocytes Relative: 7 %
Lymphs Abs: 0.9 10*3/uL (ref 0.7–4.0)
MCH: 26.6 pg (ref 26.0–34.0)
MCHC: 30.8 g/dL (ref 30.0–36.0)
MCV: 86.3 fL (ref 80.0–100.0)
Monocytes Absolute: 1.2 10*3/uL — ABNORMAL HIGH (ref 0.1–1.0)
Monocytes Relative: 8 %
Neutro Abs: 11.5 10*3/uL — ABNORMAL HIGH (ref 1.7–7.7)
Neutrophils Relative %: 82 %
Platelets: 273 10*3/uL (ref 150–400)
RBC: 3.5 MIL/uL — ABNORMAL LOW (ref 4.22–5.81)
RDW: 17.2 % — ABNORMAL HIGH (ref 11.5–15.5)
WBC: 14.1 10*3/uL — ABNORMAL HIGH (ref 4.0–10.5)
nRBC: 0.1 % (ref 0.0–0.2)

## 2018-11-01 NOTE — Progress Notes (Signed)
PROGRESS NOTE    Roger Blackwell  BTD:[id number] DOB: 1935-11-29 DOA: 10/22/2018 PCP: [name], MD    Brief Narrative:  83 year old male who presented with hemoptysis. He does have significant past medical history for type 2 diabetes mellitus, diastolic heart failure, atrial fibrillation, hypertension, chronic anemia, andsquamous cell carcinoma of the lung.Patient reported 2 days of hemoptysis, dyspnea, fevers or chills. On his initial physical examination blood pressure was 101/60, heart rate 110, respiratory rate 22, temperature 98.6, oxygen saturation 94 to98%,his lungs had no rhonchi or rales, heart S1-S2 present and rhythmic, abdomen was soft nontender, no lower extremity edema.CT chest with worsening right hilar and subcarinal mass with progressive narrowing of the distal right mainstem bronchus. Progressive consolidation through basilar segments of the right lower lobe. Enlarging 2.9 cm left adrenal mass. Chest radiograph with hyperinflation, right base opacity with associated pleural effusion.  Patient was admitted to the hospital withaworking diagnosis of hemoptysis related to worsening squamous cell carcinoma of the lung   Assessment & Plan:   Principal Problem:   Hemoptysis Active Problems:   CAD s/p left circumflex coronary stents 2002   Essential hypertension   CKD (chronic kidney disease) stage 3, GFR 30-59 ml/min (HCC)   A-fib (HCC)   Anemia, chronic disease   Pressure injury of skin   Squamous cell lung cancer, right ([place])   1. Squamous cell lung cancer, complicated with distal right main bronchus stenosis and postobstructive pneumonia. Patient will complete antibiotic therapy toady, he has remained afebrile, wbc stable at 14.1. Oxygenating well. Continue palliative radiation. Patient has very poor functional status, not current candidate for cancer therapy. Will follow physical therapy recommendations, he may qualify for SNF.   2, Atrial  fibrillation with RVR. Tolerating well AV blockade with diltiazem. His heart rate has remained well controlled. Will continue to hold on anticoagulation due to risk of recurrent hemoptysis. Continue telemetry monitoring for now.   3. Depression with metabolic encephalpathy. Patient today is more awake and alert, no significant confusion. Will have physical therapy evaluation. Continue with mirtazapine.    4. Gout. On allopurinol.   5. Stage 2 sacreal ulcer present on admission,Local wound care., per protocol.   DVT prophylaxis:scd Code Status:full Family Communication:no family at the bedside/ I spoke yesterday with his wife over the phone, will plan for pt re-evaluation, possible dc to snf.  Disposition Plan/ discharge barriers: pending placement at snf.  Body mass index is 25.65 kg/m. Malnutrition Type:  Nutrition Problem: Increased nutrient needs Etiology: cancer and cancer related treatments   Malnutrition Characteristics:  Signs/Symptoms: estimated needs   Nutrition Interventions:  Interventions: MVI, Magic cup, Education  RN Pressure Injury Documentation: Pressure Injury 11/05/2018 Sacrum Mid Stage II -  Partial thickness loss of dermis presenting as a shallow open ulcer with a red, pink wound bed without slough. pink (Active)  10/24/2018 2230  Location: Sacrum  Location Orientation: Mid  Staging: Stage II -  Partial thickness loss of dermis presenting as a shallow open ulcer with a red, pink wound bed without slough.  Wound Description (Comments): pink  Present on Admission: Yes     Consultants:   Pulmonary   Oncology   Radiation oncology   Procedures:     Antimicrobials:       Subjective: Today is more awake and alert, no agitation. Continue to be very weak and deconditioned, no nausea or vomiting. Intermittent cough but no hemoptysis.   Objective: Vitals:   10/31/18 0515 10/31/18 1402 10/31/18 2056 11/01/18 0540  BP:  92/65 114/69 114/72  (!) 104/50  Pulse: 75 (!) 58 83 90  Resp: 20 16 16 20   Temp: (!) 97.5 F (36.4 C) 98.6 F (37 C) 98.9 F (37.2 C) 98.7 F (37.1 C)  TempSrc: Oral Oral Oral Oral  SpO2: 98% 95% 91% 90%  Weight:      Height:        Intake/Output Summary (Last 24 hours) at 11/01/2018 1109 Last data filed at 11/01/2018 0730 Gross per 24 hour  Intake 750 ml  Output -  Net 750 ml   Filed Weights   10/27/18 0655 10/28/18 0611 10/29/18 0630  Weight: 93.5 kg 95.5 kg 93.1 kg    Examination:   General: deconditioned  Neurology: Awake and alert, non focal. Continue to have mitten on his left hand.   E ENT: mild pallor, no icterus, oral mucosa moist Cardiovascular: No JVD. S1-S2 present, rhythmic, no gallops, rubs, or murmurs. No lower extremity edema. Pulmonary: positive breath sounds bilaterally, no wheezing, scattered bilateral rhonchi, with no rales. Gastrointestinal. Abdomen with no organomegaly, non tender, no rebound or guarding Skin. No rashes Musculoskeletal: no joint deformities     Data Reviewed: I have personally reviewed following labs and imaging studies  CBC: Recent Labs  Lab 10/26/18 0548 10/27/18 0415 10/29/18 0556 11/01/18 0551  WBC 10.5 10.5 14.5* 14.1*  NEUTROABS 8.1* 7.9* 11.9* 11.5*  HGB 9.1* 9.2* 9.7* 9.3*  HCT 29.7* 30.7* 32.2* 30.2*  MCV 87.4 86.7 87.5 86.3  PLT 259 262 296 993   Basic Metabolic Panel: Recent Labs  Lab 10/26/18 0548 10/27/18 0415 10/28/18 0251 10/29/18 0556  NA  --   --   --  141  K  --   --   --  4.3  CL  --   --   --  104  CO2  --   --   --  30  GLUCOSE  --   --   --  123*  BUN  --   --   --  18  CREATININE 0.89 0.82 0.88 0.87  CALCIUM  --   --   --  10.9*   GFR: Estimated Creatinine Clearance: 80.9 mL/min (by C-G formula based on SCr of 0.87 mg/dL). Liver Function Tests: No results for input(s): AST, ALT, ALKPHOS, BILITOT, PROT, ALBUMIN in the last 168 hours. No results for input(s): LIPASE, AMYLASE in the last 168 hours. No  results for input(s): AMMONIA in the last 168 hours. Coagulation Profile: No results for input(s): INR, PROTIME in the last 168 hours. Cardiac Enzymes: No results for input(s): CKTOTAL, CKMB, CKMBINDEX, TROPONINI in the last 168 hours. BNP (last 3 results) No results for input(s): PROBNP in the last 8760 hours. HbA1C: No results for input(s): HGBA1C in the last 72 hours. CBG: Recent Labs  Lab 10/28/18 1633  GLUCAP 112*   Lipid Profile: No results for input(s): CHOL, HDL, LDLCALC, TRIG, CHOLHDL, LDLDIRECT in the last 72 hours. Thyroid Function Tests: No results for input(s): TSH, T4TOTAL, FREET4, T3FREE, THYROIDAB in the last 72 hours. Anemia Panel: No results for input(s): VITAMINB12, FOLATE, FERRITIN, TIBC, IRON, RETICCTPCT in the last 72 hours.    Radiology Studies: I have reviewed all of the imaging during this hospital visit personally     Scheduled Meds: . allopurinol  200 mg Oral Daily  . diltiazem  120 mg Oral Daily  . latanoprost  1 drop Both Eyes QHS  . mirtazapine  15 mg Oral QHS  . multivitamin with minerals  1 tablet Oral Daily  . sodium chloride flush  10-40 mL Intracatheter Q12H   Continuous Infusions: . piperacillin-tazobactam (ZOSYN)  IV 3.375 g (11/01/18 0731)     LOS: 8 days        Chestine Belknap Gerome Apley, MD

## 2018-11-01 NOTE — Progress Notes (Signed)
Physical Therapy Treatment Patient Details Name: Roger Blackwell MRN: [id number] DOB: 25-Mar-1936 Today's Date: 11/01/2018    History of Present Illness 83 yo male admitted to ED on 6/8 with weakness, falls, hemoptysis. Hx of stage III lung ca, necrotizing Pna, cad with coronary angioplasty, chf, dm, gout, afib, prostate ca.    PT Comments    Pt agreeable to mobilize with PT today. Pt required min-mod assist for bed mobility and sit to stand, very limited safety awareness in standing as pt was attempting to ambulate when pt had not gained his balance in standing. Pt continues to present with significant weakness, decreased activity tolerance, and limited mobility. Pt also with dyspnea and O2 desat to 80% on RA. Pt placed on 2LO2 via [place], RN notified. PT continuing to recommend SNF level of care post-acutely. Will continue to follow.   Follow Up Recommendations  Supervision/Assistance - 24 hour;SNF     Equipment Recommendations  None recommended by PT    Recommendations for Other Services       Precautions / Restrictions Precautions Precautions: Fall Restrictions Weight Bearing Restrictions: No    Mobility  Bed Mobility Overal bed mobility: Needs Assistance Bed Mobility: Supine to Sit;Sit to Supine     Supine to sit: Min assist;HOB elevated Sit to supine: Mod assist;HOB elevated   General bed mobility comments: Min assist to get EOB for LE management, trunk elevation with use of HHA. mod assist for sit to supine for lifting LEs into bed, scooting pt up in bed with use of boost function.  Transfers Overall transfer level: Needs assistance Equipment used: Rolling walker (2 wheeled) Transfers: Sit to/from Stand Sit to Stand: Mod assist;From elevated surface         General transfer comment: Mod assist for power up, steadying. VC for hip extension to neutral, hand placement. Pt with heavy anterior leaning in standing, PT with multimodal cues for upright posture but pt did  not correct and attempted to ambulate. PT encouraged pt to sit, high fall risk.  Ambulation/Gait                 Stairs             Wheelchair Mobility    Modified Rankin (Stroke Patients Only)       Balance Overall balance assessment: Needs assistance Sitting-balance support: Feet supported Sitting balance-Leahy Scale: Fair Sitting balance - Comments: Able to sit EOB without PT support, limited by fatigue   Standing balance support: Bilateral upper extremity supported Standing balance-Leahy Scale: Poor Standing balance comment: reliant on UE support                            Cognition Arousal/Alertness: Awake/alert Behavior During Therapy: WFL for tasks assessed/performed Overall Cognitive Status: Impaired/Different from baseline Area of Impairment: Following commands;Problem solving;Safety/judgement                       Following Commands: Follows one step commands consistently Safety/Judgement: Decreased awareness of deficits;Decreased awareness of safety   Problem Solving: Slow processing;Requires verbal cues;Requires tactile cues        Exercises General Exercises - Lower Extremity Ankle Circles/Pumps: AROM;Both;10 reps;Supine Heel Slides: AROM;Both;Supine;10 reps Hip Flexion/Marching: AROM;Both;20 reps;Seated Shoulder Exercises Shoulder Flexion: AROM;Both;20 reps;Seated    General Comments General comments (skin integrity, edema, etc.): HR 90s-138 bpm in afib, SpO2 80% on RA so pt placed on supplemental O2 with recovery into high 80s.  Pertinent Vitals/Pain Pain Assessment: No/denies pain    Home Living                      Prior Function            PT Goals (current goals can now be found in the care plan section) Acute Rehab PT Goals Patient Stated Goal: To try and see if he can stand up. PT Goal Formulation: With patient Time For Goal Achievement: 11/08/18 Potential to Achieve Goals:  Fair Progress towards PT goals: Progressing toward goals    Frequency    Min 2X/week      PT Plan Discharge plan needs to be updated    Co-evaluation              AM-PAC PT "6 Clicks" Mobility   Outcome Measure  Help needed turning from your back to your side while in a flat bed without using bedrails?: A Little Help needed moving from lying on your back to sitting on the side of a flat bed without using bedrails?: A Little Help needed moving to and from a bed to a chair (including a wheelchair)?: A Lot Help needed standing up from a chair using your arms (e.g., wheelchair or bedside chair)?: A Lot Help needed to walk in hospital room?: Total Help needed climbing 3-5 steps with a railing? : Total 6 Click Score: 12    End of Session Equipment Utilized During Treatment: Gait belt Activity Tolerance: Patient limited by fatigue Patient left: in bed;with bed alarm set;with call bell/phone within reach Nurse Communication: Mobility status;Other (comment)(desat on RA) PT Visit Diagnosis: Unsteadiness on feet (R26.81);Muscle weakness (generalized) (M62.81);Difficulty in walking, not elsewhere classified (R26.2)     Time: 1345-1411 PT Time Calculation (min) (ACUTE ONLY): 26 min  Charges:  $Therapeutic Exercise: 8-22 mins $Therapeutic Activity: 8-22 mins                    Devanny Palecek Conception Chancy, PT Acute Rehabilitation Services Pager [phone]  Office [phone]    Gisele Pack D Danashia Landers 11/01/2018, 3:02 PM

## 2018-11-01 NOTE — Progress Notes (Signed)
OT Cancellation Note  Patient Details Name: Roger Blackwell MRN: [id number] DOB: Sep 14, 1935   Cancelled Treatment:    Reason Eval/Treat Not Completed: Fatigue/lethargy limiting ability to participate.  Pt feels too weak to work with OT, even sitting EOB with assistance. Feel he will need SNF at d/c. Will check back another day.  [place] 11/01/2018, 2:52 PM  Lesle Chris, OTR/L Acute Rehabilitation Services [phone] [facility] pager [phone] office 11/01/2018

## 2018-11-02 ENCOUNTER — Ambulatory Visit
Admission: RE | Admit: 2018-11-02 | Discharge: 2018-11-02 | Disposition: A | Discharge status: Home / Self Care | Payer: Medicare Other | Source: Ambulatory Visit | Attending: Radiation Oncology | Admitting: Radiation Oncology

## 2018-11-02 LAB — BASIC METABOLIC PANEL
Anion gap: 8 (ref 5–15)
BUN: 21 mg/dL (ref 8–23)
CO2: 29 mmol/L (ref 22–32)
Calcium: 10.7 mg/dL — ABNORMAL HIGH (ref 8.9–10.3)
Chloride: 103 mmol/L (ref 98–111)
Creatinine, Ser: 1.09 mg/dL (ref 0.61–1.24)
GFR calc Af Amer: >60 mL/min (ref >60)
GFR calc non Af Amer: >60 mL/min (ref >60)
Glucose, Bld: 137 mg/dL — ABNORMAL HIGH (ref 70–99)
Potassium: 4.5 mmol/L (ref 3.5–5.1)
Sodium: 140 mmol/L (ref 135–145)

## 2018-11-02 MED ORDER — DEXTROSE IN LACTATED RINGERS 5 % IV SOLN
INTRAVENOUS | Status: DC
  Administered 2018-11-02 – 2018-11-03 (×2): via INTRAVENOUS

## 2018-11-02 NOTE — Progress Notes (Signed)
PROGRESS NOTE    Roger Blackwell  DDU:[id number] DOB: May 19, 1935 DOA: 11/05/2018 PCP: [name], MD    Brief Narrative:  83 year old male who presented with hemoptysis. He does have significant past medical history for type 2 diabetes mellitus, diastolic heart failure, atrial fibrillation, hypertension, chronic anemia, andsquamous cell carcinoma of the lung.Patient reported 2 days of hemoptysis, dyspnea, fevers or chills. On his initial physical examination blood pressure was 101/60, heart rate 110, respiratory rate 22, temperature 98.6, oxygen saturation 94 to98%,his lungs had no rhonchi or rales, heart S1-S2 present and rhythmic, abdomen was soft nontender, no lower extremity edema.CT chest with worsening right hilar and subcarinal mass with progressive narrowing of the distal right mainstem bronchus. Progressive consolidation through basilar segments of the right lower lobe. Enlarging 2.9 cm left adrenal mass. Chest radiograph with hyperinflation, right base opacity with associated pleural effusion.  Patient was admitted to the hospital withaworking diagnosis of hemoptysis related to worsening squamous cell carcinoma of the lung   Assessment & Plan:   Principal Problem:   Hemoptysis Active Problems:   CAD s/p left circumflex coronary stents 2002   Essential hypertension   CKD (chronic kidney disease) stage 3, GFR 30-59 ml/min (HCC)   A-fib (HCC)   Anemia, chronic disease   Pressure injury of skin   Squamous cell lung cancer, right ([place])   1. Squamous cell lung cancer, complicated with distal right main bronchus stenosis and postobstructive pneumonia.oxygenating well at 97 to 98 % on supplemental 02 per [place]. Completed antibiotic therapy. Very weak and deconditioned, poor functional status for cancer therapy. Continue palliative radiation. His wife and him would like snf.    2, Atrial fibrillation with RVR.Continue rate control with diltiazem, continue telemetry  monitoring, HR 71 to 84.    3. Depressionwith metabolic encephalpathy.intermittent somnolence, but no confusion or agitation. Very weak and deconditioned.   4. Gout. Continue with allopurinol.   5. Stage 2 sacreal ulcer present on admission, Continue with local wound care.  DVT prophylaxis:scd Code Status:full Family Communication:no family at the bedside Disposition Plan/ discharge barriers: pending placement at snf.  Body mass index is 25.24 kg/m. Malnutrition Type:  Nutrition Problem: Increased nutrient needs Etiology: cancer and cancer related treatments   Malnutrition Characteristics:  Signs/Symptoms: estimated needs   Nutrition Interventions:  Interventions: MVI, Magic cup, Education  RN Pressure Injury Documentation: Pressure Injury 11/13/2018 Sacrum Mid Stage II -  Partial thickness loss of dermis presenting as a shallow open ulcer with a red, pink wound bed without slough. pink (Active)  10/17/2018 2230  Location: Sacrum  Location Orientation: Mid  Staging: Stage II -  Partial thickness loss of dermis presenting as a shallow open ulcer with a red, pink wound bed without slough.  Wound Description (Comments): pink  Present on Admission: Yes     Consultants:   Pulmonary   Radiation oncology   Oncology   Procedures:     Antimicrobials:       Subjective: Patient continue to be very weak and deconditioned, no nausea or vomiting, no coughing or hemoptysis, no dyspnea or chest pain.   Objective: Vitals:   11/01/18 2013 11/02/18 0455 11/02/18 0500 11/02/18 1002  BP: 111/68 126/77  114/74  Pulse: (!) 59 78  (!) 53  Resp: 20 20    Temp: 98.3 F (36.8 C) 98 F (36.7 C)  (!) 97.3 F (36.3 C)  TempSrc:  Oral  Oral  SpO2: 100% 93%  98%  Weight:   91.6 kg   Height:  Intake/Output Summary (Last 24 hours) at 11/02/2018 1108 Last data filed at 11/01/2018 1300 Gross per 24 hour  Intake 143.7 ml  Output -  Net 143.7 ml   Filed  Weights   10/28/18 0611 10/29/18 0630 11/02/18 0500  Weight: 95.5 kg 93.1 kg 91.6 kg    Examination:   General: deconditioned  Neurology: Awake and alert, non focal  E ENT: mild pallor, no icterus, oral mucosa moist Cardiovascular: No JVD. S1-S2 present, rhythmic, no gallops, rubs, or murmurs. No lower extremity edema. Pulmonary: positive breath sounds bilaterally, adequate air movement, no wheezing, scattered rhonchi or rales. Gastrointestinal. Abdomen with  no organomegaly, non tender, no rebound or guarding Skin. No rashes Musculoskeletal: no joint deformities     Data Reviewed: I have personally reviewed following labs and imaging studies  CBC: Recent Labs  Lab 10/27/18 0415 10/29/18 0556 11/01/18 0551  WBC 10.5 14.5* 14.1*  NEUTROABS 7.9* 11.9* 11.5*  HGB 9.2* 9.7* 9.3*  HCT 30.7* 32.2* 30.2*  MCV 86.7 87.5 86.3  PLT 262 296 309   Basic Metabolic Panel: Recent Labs  Lab 10/27/18 0415 10/28/18 0251 10/29/18 0556 11/02/18 0557  NA  --   --  141 140  K  --   --  4.3 4.5  CL  --   --  104 103  CO2  --   --  30 29  GLUCOSE  --   --  123* 137*  BUN  --   --  18 21  CREATININE 0.82 0.88 0.87 1.09  CALCIUM  --   --  10.9* 10.7*   GFR: Estimated Creatinine Clearance: 64.6 mL/min (by C-G formula based on SCr of 1.09 mg/dL). Liver Function Tests: No results for input(s): AST, ALT, ALKPHOS, BILITOT, PROT, ALBUMIN in the last 168 hours. No results for input(s): LIPASE, AMYLASE in the last 168 hours. No results for input(s): AMMONIA in the last 168 hours. Coagulation Profile: No results for input(s): INR, PROTIME in the last 168 hours. Cardiac Enzymes: No results for input(s): CKTOTAL, CKMB, CKMBINDEX, TROPONINI in the last 168 hours. BNP (last 3 results) No results for input(s): PROBNP in the last 8760 hours. HbA1C: No results for input(s): HGBA1C in the last 72 hours. CBG: Recent Labs  Lab 10/28/18 1633  GLUCAP 112*   Lipid Profile: No results for  input(s): CHOL, HDL, LDLCALC, TRIG, CHOLHDL, LDLDIRECT in the last 72 hours. Thyroid Function Tests: No results for input(s): TSH, T4TOTAL, FREET4, T3FREE, THYROIDAB in the last 72 hours. Anemia Panel: No results for input(s): VITAMINB12, FOLATE, FERRITIN, TIBC, IRON, RETICCTPCT in the last 72 hours.    Radiology Studies: I have reviewed all of the imaging during this hospital visit personally     Scheduled Meds: . allopurinol  200 mg Oral Daily  . diltiazem  120 mg Oral Daily  . latanoprost  1 drop Both Eyes QHS  . mirtazapine  15 mg Oral QHS  . multivitamin with minerals  1 tablet Oral Daily  . sodium chloride flush  10-40 mL Intracatheter Q12H   Continuous Infusions:   LOS: 9 days        Javionna Leder Gerome Apley, MD

## 2018-11-02 NOTE — Progress Notes (Signed)
OT Cancellation Note  Patient Details Name: Roger Blackwell MRN: [id number] DOB: 01-08-1936   Cancelled Treatment:    Reason Eval/Treat Not Completed: Patient at procedure or test/ unavailable. Pt off unit for radiation therapy. OT will return at next appropriate/available time  Britt Bottom 11/02/2018, 11:41 AM

## 2018-11-02 NOTE — Progress Notes (Signed)
[facility] Therapy Treatment Record Phone [phone]   Radiation Therapy was administered to Roger Blackwell on: 11/02/2018  11:32 AM and was treatment # 6 out of a planned course of 10 treatments.  Radiation Treatment  1). Beam photons with 6-10 energy  2). Brachytherapy None  3). Stereotactic Radiosurgery None  4). Other Radiation None     Jacques Earthly, RT (T)

## 2018-11-02 NOTE — Care Management Important Message (Signed)
Important Message  Patient Details  Name: Roger Blackwell MRN: [id number] Date of Birth: 23-Apr-1936   Medicare Important Message Given:  Yes. CMA printed out IM for LCSW, Kathrin Greathouse to give to the patient.   Quetzaly Ebner 11/02/2018, 9:31 AM

## 2018-11-02 NOTE — Progress Notes (Signed)
Spoke with Dr Cathlean Sauer concerning patient being very lethargic, B/P trending down and not able to take PO medications safely. New orders given. Will continue to monitor.

## 2018-11-03 ENCOUNTER — Ambulatory Visit
Admission: RE | Admit: 2018-11-03 | Discharge: 2018-11-03 | Disposition: A | Discharge status: Home / Self Care | Payer: Medicare Other | Source: Ambulatory Visit | Attending: Radiation Oncology | Admitting: Radiation Oncology

## 2018-11-03 ENCOUNTER — Encounter: Payer: Self-pay | Admitting: Radiation Oncology

## 2018-11-03 ENCOUNTER — Inpatient Hospital Stay (HOSPITAL_COMMUNITY): Payer: Medicare Other

## 2018-11-03 LAB — BLOOD GAS, ARTERIAL
Drawn by: 270211
O2 Content: 3 L/min
O2 Saturation: 96 %
Patient temperature: 37
pH, Arterial: 7.049 — CL (ref 7.350–7.450)
pO2, Arterial: 89.2 mmHg (ref 83.0–108.0)

## 2018-11-03 MED ORDER — MORPHINE 100MG IN NS 100ML (1MG/ML) PREMIX INFUSION
1.0000 mg/h | INTRAVENOUS | Status: DC
  Administered 2018-11-03: 1 mg/h via INTRAVENOUS
  Filled 2018-11-03: qty 100

## 2018-11-06 ENCOUNTER — Ambulatory Visit: Payer: Medicare Other

## 2018-11-07 ENCOUNTER — Ambulatory Visit: Payer: Medicare Other

## 2018-11-08 ENCOUNTER — Ambulatory Visit: Payer: Medicare Other

## 2018-11-09 ENCOUNTER — Ambulatory Visit: Payer: Medicare Other

## 2018-11-14 DIAGNOSIS — J449 Chronic obstructive pulmonary disease, unspecified: Secondary | ICD-10-CM | POA: Diagnosis not present

## 2018-11-15 NOTE — Death Summary Note (Signed)
Death Summary  Roger Blackwell FYB:[id number] DOB: [birth year] DOA: 2018-11-12  PCP: [name], MD  Admit date: November 12, 2018 Date of Death: 11/23/18 Time of Death: 18:43 Notification: [name], MD notified of death of 12/02/2018   History of present illness:  Roger Blackwell is a 83 y.o. male with a history of type 2 diabetes mellitus, diastolic heart failure, atrial fibrillation, hypertension, chronic anemia, andsquamous cell carcinoma of the lung.  Roger Blackwell presented with complaint of hemoptysis. Patient reported 2 days of hemoptysis, dyspnea, fevers and chills. On his initial physical examination blood pressure was 101/60, heart rate 110, respiratory rate 22, temperature 98.6, oxygen saturation 94 to98%,his lungs had no rhonchi or rales, heart S1-S2 present and rhythmic, abdomen was soft nontender, no lower extremity edema.CT chest with worsening right hilar and subcarinal mass with progressive narrowing of the distal right mainstem bronchus. Progressive consolidation through basilar segments of the right lower lobe. Enlarging 2.9 cm left adrenal mass. Chest radiograph with hyperinflation, right base opacity with associated pleural effusion.  Patient was admitted to the hospital withaworking diagnosis of hemoptysis related to worsening squamous cell carcinoma of the lung  His condition has been rapidly deteriorating with worsening somnolence, very poor oral intake and generalized weakness.  Roger Blackwell did not improve after aggressive radiation therapy, and pulmonary toileting along with antibiotics..  He developed progressive hypercapnic respiratory failure, failed trial of noninvasive mechanical ventilation.  Due to poor prognosis status was changed to comfort measures, by his wife, and he was allowed to have natural death.  Final Diagnoses:  1.  Acute encephalopathy due to acute hypercapnic respiratory failure.   2. Squamous cell lung cancer, complicated with  distal right main bronchus stenosis and postobstructive pneumonia.  3, Atrial fibrillation with RVR.    4. Gout.  5. Stage 2 sacreal ulcer present on admission.  The results of significant diagnostics from this hospitalization (including imaging, microbiology, ancillary and laboratory) are listed below for reference.    Significant Diagnostic Studies: Dg Knee 1-2 Views Left  Result Date: 10/24/2018 CLINICAL DATA:  Pain EXAM: LEFT KNEE - 1-2 VIEW COMPARISON:  None. FINDINGS: There is no acute displaced fracture or dislocation. Mild multicompartmental joint space narrowing is noted. There is a trace suprapatellar joint effusion. IMPRESSION: No acute osseous abnormality. Electronically Signed   By: Constance Holster M.D.   On: 10/24/2018 03:24   Dg Knee 1-2 Views Right  Result Date: 10/24/2018 CLINICAL DATA:  Pain EXAM: RIGHT KNEE - 1-2 VIEW COMPARISON:  None. FINDINGS: There is no acute displaced fracture or dislocation. There is no prepatellar soft tissue swelling. There is no significant joint effusion. There are degenerative changes of the knee, greatest within the medial and patellofemoral compartments. IMPRESSION: No acute osseous abnormality. Electronically Signed   By: Constance Holster M.D.   On: 10/24/2018 03:23   Ct Head Wo Contrast  Result Date: 11-23-2018 CLINICAL DATA:  Initial evaluation for acute headache, fatigue, lethargy. EXAM: CT HEAD WITHOUT CONTRAST TECHNIQUE: Contiguous axial images were obtained from the base of the skull through the vertex without intravenous contrast. COMPARISON:  Prior brain MRI and CT from 10/19/2018. FINDINGS: Brain: Cerebral volume within normal limits for patient age. No evidence for acute intracranial hemorrhage. No findings to suggest acute large vessel territory infarct. No mass lesion, midline shift, or mass effect. Ventricles are normal in size without evidence for hydrocephalus. No extra-axial fluid collection identified. Vascular: No  hyperdense vessel identified. Skull: Scalp soft tissues demonstrate no acute abnormality. Calvarium intact. Sinuses/Orbits: Globes  and orbital soft tissues within normal limits. Visualized paranasal sinuses are clear. No mastoid effusion. IMPRESSION: Stable normal head CT for age. No acute intracranial abnormality identified. Electronically Signed   By: Jeannine Boga M.D.   On: 2018/11/23 14:16   Ct Head Wo Contrast  Result Date: 10/19/2018 CLINICAL DATA:  Facial droop since 0900 hours today. Slurred speech since last night at 1930 hours. EXAM: CT HEAD WITHOUT CONTRAST TECHNIQUE: Contiguous axial images were obtained from the base of the skull through the vertex without intravenous contrast. COMPARISON:  None. FINDINGS: Brain: Low density area in the anterior aspect of each cerebellar hemisphere with an appearance suggesting areas of acute infarction in the axial plane. In the sagittal and coronal planes, these have appearances suggesting normal folia. Mildly enlarged ventricles and cortical sulci. Mild patchy white matter low density in both cerebral hemispheres. No intracranial hemorrhage, mass lesion or CT evidence of acute infarction. Vascular: No hyperdense vessel or unexpected calcification. Skull: Normal. Negative for fracture or focal lesion. Sinuses/Orbits: Status post left cataract extraction. Mild left ethmoid sinus mucosal thickening. Other: None. IMPRESSION: 1. Possible acute bilateral cerebellar hemisphere infarcts. Based on symptoms, further evaluation with brain MRI is recommended. 2. No intracranial hemorrhage. 3. Mild atrophy and mild chronic small vessel white matter ischemic changes in both cerebral hemispheres. Electronically Signed   By: Claudie Revering M.D.   On: 10/19/2018 17:02   Ct Chest Wo Contrast  Result Date: 11/10/2018 CLINICAL DATA:  Hemoptysis. Lung carcinoma. EXAM: CT CHEST WITHOUT CONTRAST TECHNIQUE: Multidetector CT imaging of the chest was performed following the  standard protocol without IV contrast. COMPARISON:  09/02/2018 FINDINGS: Cardiovascular: Heart size normal. No pericardial effusion. Mild calcified aortic plaque. Dilated proximal descending thoracic segment 3.8 cm diameter, stable since previous. Mediastinum/Nodes: Confluent right hilar and subcarinal mass, with progressive narrowing of distal right mainstem bronchus, right upper lobe bronchus and bronchus intermedius. Lungs/Pleura: Bilateral partially calcified pleural plaques. Cavitary mass in the posterior right lower lobe with fluid level as before. Progressive consolidation throughout basilar segments of the right lower lobe. Upper Abdomen: 2.9 cm enlarging left adrenal mass. No acute findings. Musculoskeletal: No fracture or worrisome bone lesion. IMPRESSION: 1. Worsening right hilar and subcarinal mass with progressive narrowing of the distal right mainstem bronchus. 2. Progressive consolidation throughout basilar segments of the right lower lobe. 3. Enlarging 2.9 cm left adrenal mass, probable metastatic lesion. Electronically Signed   By: Lucrezia Europe M.D.   On: 10/29/2018 18:49   Mr Brain Wo Contrast  Result Date: 10/19/2018 CLINICAL DATA:  Initial evaluation for acute slurred speech, facial droop. EXAM: MRI HEAD WITHOUT CONTRAST TECHNIQUE: Multiplanar, multiecho pulse sequences of the brain and surrounding structures were obtained without intravenous contrast. COMPARISON:  Prior CT from earlier the same day. FINDINGS: Brain: Generalized age appropriate cerebral atrophy. No significant cerebral white matter disease. No abnormal foci of restricted diffusion to suggest acute or subacute ischemia. Gray-white matter differentiation maintained. No encephalomalacia to suggest chronic cortical infarction. No foci of susceptibility artifact to suggest acute or chronic intracranial hemorrhage. No mass lesion, midline shift or mass effect. No hydrocephalus. No extra-axial fluid collection. Pituitary gland  suprasellar region normal. Midline structures intact. Vascular: Major intracranial vascular flow voids are maintained. Skull and upper cervical spine: Craniocervical junction normal. Upper cervical spine within normal limits. Bone marrow signal intensity normal. 13 mm lipoma noted at the right occipital scalp. Scalp soft tissues otherwise unremarkable. Sinuses/Orbits: Patient status post ocular lens replacement on the left. Globes and orbital  soft tissues demonstrate no acute finding. Mild scattered mucosal thickening within the ethmoidal air cells. Paranasal sinuses are otherwise clear. No mastoid effusion. Inner ear structures grossly normal. Other: None. IMPRESSION: Normal brain MRI for age. No acute intracranial infarct or other abnormality identified. Electronically Signed   By: Jeannine Boga M.D.   On: 10/19/2018 20:03   Dg Pelvis Portable  Result Date: 10/24/2018 CLINICAL DATA:  Pain status post fall EXAM: PORTABLE PELVIS 1-2 VIEWS COMPARISON:  None. FINDINGS: There is no acute displaced fracture or dislocation. Multiple surgical clips project over the patient's pelvis. There are degenerative changes of both hips, left worse than right. IMPRESSION: No acute osseous abnormality. Electronically Signed   By: Constance Holster M.D.   On: 10/24/2018 03:25   Dg Chest Port 1 View  Result Date: 10/24/2018 CLINICAL DATA:  PICC line placement EXAM: PORTABLE CHEST 1 VIEW COMPARISON:  11/06/2018, 10/19/2018, 10/10/2018, CT chest 10/25/2018 FINDINGS: Right upper extremity catheter tip overlies the distal SVC. Bilateral calcified pleural plaques. Similar appearance of right pleural disease and hazy airspace disease in the right thorax. Stable cardiomediastinal silhouette. No pneumothorax. IMPRESSION: 1. Right upper extremity catheter tip overlies the distal SVC. 2. Similar appearance of right pleural and parenchymal disease. 3. Bilateral calcified pleural plaques Electronically Signed   By: Donavan Foil M.D.    On: 10/24/2018 15:28   Dg Chest Port 1 View  Result Date: 10/24/2018 CLINICAL DATA:  Family stated patient started coughing up blood yesterday. Bright red and no clots. Patient has stage 3 lung cancer. Patient is on antibiotic for pneumonia. Patient has been tested twice for Covid and both negative. H/o Diabetes, CHF, CAD. Former smoker. EXAM: PORTABLE CHEST - 1 VIEW COMPARISON:  10/19/2018 FINDINGS: Persistent small right effusion with adjacent opacity at the right lung base. Stable coarse interstitial opacities in the left lower lung. Partially calcified left pleural plaques. No pneumothorax. Heart size and mediastinal contours are within normal limits. Visualized bones unremarkable. IMPRESSION: Persistent small right pleural effusion and adjacent right lower lung atelectasis/consolidation. Electronically Signed   By: Lucrezia Europe M.D.   On: 10/22/2018 17:42   Dg Chest Port 1 View  Result Date: 10/19/2018 CLINICAL DATA:  Sepsis. EXAM: PORTABLE CHEST 1 VIEW COMPARISON:  Chest x-rays 5/18 and 10/10/2018 and chest CT 09/02/2018 FINDINGS: The heart is within normal limits in size and stable. Stable tortuosity of the thoracic aorta. Chronic bilateral lung changes with pleural calcifications and pulmonary scarring. Persistent right basilar process. Difficult to evaluate the cavitary findings when compared to prior CT scan. IMPRESSION: 1. Chronic underlying lung disease with pleural calcifications on the left side. 2. Persistent right basilar process likely combination of effusion, atelectasis and infection. Electronically Signed   By: Marijo Sanes M.D.   On: 10/19/2018 16:31   [place] Ekg Site Rite  Result Date: 10/29/2018 If Site Rite image not attached, placement could not be confirmed due to current cardiac rhythm.  [place] Ekg Site Rite  Result Date: 10/24/2018 If Site Rite image not attached, placement could not be confirmed due to current cardiac rhythm.   Microbiology: No results found for this or any  previous visit (from the past 240 hour(s)).   Labs: Basic Metabolic Panel: No results for input(s): NA, K, CL, CO2, GLUCOSE, BUN, CREATININE, CALCIUM, MG, PHOS in the last 168 hours. Liver Function Tests: No results for input(s): AST, ALT, ALKPHOS, BILITOT, PROT, ALBUMIN in the last 168 hours. No results for input(s): LIPASE, AMYLASE in the last 168 hours. No  results for input(s): AMMONIA in the last 168 hours. CBC: No results for input(s): WBC, NEUTROABS, HGB, HCT, MCV, PLT in the last 168 hours. Cardiac Enzymes: No results for input(s): CKTOTAL, CKMB, CKMBINDEX, TROPONINI in the last 168 hours. D-Dimer No results for input(s): DDIMER in the last 72 hours. BNP: Invalid input(s): POCBNP CBG: No results for input(s): GLUCAP in the last 168 hours. Anemia work up No results for input(s): VITAMINB12, FOLATE, FERRITIN, TIBC, IRON, RETICCTPCT in the last 72 hours. Urinalysis    Component Value Date/Time   COLORURINE AMBER (A) 10/19/2018 1700   APPEARANCEUR CLOUDY (A) 10/19/2018 1700   LABSPEC 1.018 10/19/2018 1700   PHURINE 5.0 10/19/2018 1700   GLUCOSEU NEGATIVE 10/19/2018 1700   HGBUR NEGATIVE 10/19/2018 1700   BILIRUBINUR NEGATIVE 10/19/2018 1700   KETONESUR 5 (A) 10/19/2018 1700   PROTEINUR 30 (A) 10/19/2018 1700   UROBILINOGEN 0.2 07/14/2007 1105   NITRITE NEGATIVE 10/19/2018 1700   LEUKOCYTESUR LARGE (A) 10/19/2018 1700   Sepsis Labs Invalid input(s): PROCALCITONIN,  WBC,  LACTICIDVEN     SIGNED:  Tawni Millers, MD  [facility] 11/12/2018, 6:01 PM Pager   If 7PM-7AM, please contact night-coverage [email or web address] Password [email or web address]

## 2018-11-15 NOTE — Progress Notes (Signed)
Discussed with MD Arrien in regards to patient's clinical status deteriorating. Upon arrival to room patient was unresponsive, slightly posturing, per ABG Co2 is unreadable, and patient agonally breathing. Wife has been called to bedside due to patient decompensating. MD Arrien ordered for BIPAP to be placed until wife can arrive to bedside. Once wife arrives will take off mask so patient can be comfortable. Patient is very ill and concern patient might pass away when transporting to SDU. Will keep patient here at this time.

## 2018-11-15 NOTE — Progress Notes (Signed)
At [place] patient has expired.  Selmer Dominion and RN Festus Holts verified  And notified MD at [phone]. [facility] notified and patient was not a candidate for donor. Post mortem was done. Patient was waiting for medical examiner as bed placement called and required. RN night shift was notified and got a report as well.

## 2018-11-15 NOTE — Progress Notes (Signed)
Patient had developed persistent encephalopathy, his pH is down 7.0, very poor prognosis.  He did fail trial of a noninvasive mechanical ventilation with persistent encephalopathy and risk of aspiration.  His wife is at the bedside as well as her daughter, considering patient's condition and poor prognosis, decision was made to continue care with comfort measures.  Patient will be placed on IV morphine for comfort measures protocol, discontinue BiPAP.

## 2018-11-15 NOTE — Progress Notes (Signed)
Patient received his sixth of ten intended radiation treatments today to his right lung. Lethargy noted. Phoned floor nurse, Kerry Dory, for update. Calvin reports the plan is to apply bipap and move patient to stepdown unit. Informed Dr. Tammi Klippel of all findings. Dr. Tammi Klippel expressed intent to move forward with radiation in a continued attempt to open airway of right lung. Phoned patient's wife, Peter Congo, to update. Peter Congo reported she was leaving her home headed to the hospital now. She verbalized her understanding from the hospitalist that her husband isn't breathing well and that she should present to the hospital. Expressed that Dr. Tammi Klippel intends to reach out to her later today with a radiation update as promised. She verbalized understanding and expressed appreciation for the call.

## 2018-11-15 NOTE — Progress Notes (Signed)
   11-27-18 1500  Clinical Encounter Type  Visited With Patient and family together  Visit Type Initial;Psychological support;Spiritual support;Patient actively dying  Referral From Nurse  Consult/Referral To Chaplain  Spiritual Encounters  Spiritual Needs Prayer;Emotional;Grief support;Other (Comment) (Spiritual Care Conversation/Support)  Stress Factors  Patient Stress Factors Not reviewed  Family Stress Factors Health changes;Loss;Major life changes   I visited with the patient, his wife and a relative at the bedside. Peter Congo, Mr. Yarrow's wife, requested that I pray with them. Peter Congo stated that her father passed away a week ago and her uncle a week before that. I prayed with them, and provided them a prayer blanket.  I provided grief support. Their spirituality is very important to them and a means of strength and comfort.    Please, contact Spiritual Care for further assistance.   Chaplain Shanon Ace M.Div., [facility]

## 2018-11-15 NOTE — Progress Notes (Signed)
Patient is very lethargic; RN could not wake patient up to take PO medication safely. Paged MD at [phone]. Waiting for new order.

## 2018-11-15 NOTE — Progress Notes (Signed)
OT Cancellation Note  Patient Details Name: Roger Blackwell MRN: [id number] DOB: 10-06-1935   Cancelled Treatment:    Reason Eval/Treat Not Completed: Fatigue/lethargy limiting ability to participate. Pt very lethargic, sleeping heavily. Per nursing staff, pt has been lethargic since this morning. OT will check next available/appropriate time  Britt Bottom November 12, 2018, 12:33 PM

## 2018-11-15 NOTE — Progress Notes (Addendum)
PROGRESS NOTE    Roger Blackwell  ZDG:[id number] DOB: 05/22/1935 DOA: 11/01/2018 PCP: [name], MD    Brief Narrative:  83 year old male who presented with hemoptysis. He does have significant past medical history for type 2 diabetes mellitus, diastolic heart failure, atrial fibrillation, hypertension, chronic anemia, andsquamous cell carcinoma of the lung.Patient reported 2 days of hemoptysis, dyspnea, fevers or chills. On his initial physical examination blood pressure was 101/60, heart rate 110, respiratory rate 22, temperature 98.6, oxygen saturation 94 to98%,his lungs had no rhonchi or rales, heart S1-S2 present and rhythmic, abdomen was soft nontender, no lower extremity edema.CT chest with worsening right hilar and subcarinal mass with progressive narrowing of the distal right mainstem bronchus. Progressive consolidation through basilar segments of the right lower lobe. Enlarging 2.9 cm left adrenal mass. Chest radiograph with hyperinflation, right base opacity with associated pleural effusion.  Patient was admitted to the hospital withaworking diagnosis of hemoptysis related to worsening squamous cell carcinoma of the lung  His condition has been rapidly deteriorating with worsening somnolence, very poor oral intake and generalized weakness.  Assessment & Plan:   Principal Problem:   Hemoptysis Active Problems:   CAD s/p left circumflex coronary stents 2002   Essential hypertension   CKD (chronic kidney disease) stage 3, GFR 30-59 ml/min (HCC)   A-fib (HCC)   Anemia, chronic disease   Pressure injury of skin   Squamous cell lung cancer, right ([place])   1.  Acute encephalopathy. Over last 24 H patient had progressive somnolence, this am is not responsive to voice, his Glasgow coma scale is down to 8, mumbling sounds and localizes pain. Patient has been off anticoagulation since admission due to hemoptysis. Need to rule out acute CVA. Will order stat head CT, I  called his wife and now patient is DNR. Continue neuro checks and aspiration precautions. If negative head ct will need brain MRI. Will keep patient npo and continue IV fluids.   1. Squamous cell lung cancer, complicated with distal right main bronchus stenosis and postobstructive pneumonia.Patient with decreased mentation, oxygenation is 98 % on 2 LPM per nasal cannula. He has been getting radiation therapy. Will get ABG, for diagnostic and prognostic, he is not candidate for mechanical ventilation, (non invasive or invasive). Consulted palliative care team, for transition to hospice.   2, Atrial fibrillation with RVR. Unable to take po meds, will continue telemetry monitoring. No anticoagulation due to hemoptysis.    3. Gout.No acute flare.   4. Stage 2 sacreal ulcer present on admission, On local wound care. Poor mobility.   DVT prophylaxis:scd Code Status:full Family Communication:no family at the bedside Disposition Plan/ discharge barriers: worsening encephalopathy, potential hospice if recovers clinical stability.  Patient is critically ill with worsening encephalopathy, Glasgow coma scale of 8. Critical care time 45 minutes.    Body mass index is 25.24 kg/m. Malnutrition Type:  Nutrition Problem: Increased nutrient needs Etiology: cancer and cancer related treatments   Malnutrition Characteristics:  Signs/Symptoms: estimated needs   Nutrition Interventions:  Interventions: MVI, Magic cup, Education  RN Pressure Injury Documentation: Pressure Injury 10/26/2018 Sacrum Mid Stage II -  Partial thickness loss of dermis presenting as a shallow open ulcer with a red, pink wound bed without slough. pink (Active)  11/13/2018 2230  Location: Sacrum  Location Orientation: Mid  Staging: Stage II -  Partial thickness loss of dermis presenting as a shallow open ulcer with a red, pink wound bed without slough.  Wound Description (Comments): pink  Present on  Admission: Yes      Consultants:    Radiation oncology   Oncology   Pulmonary   Palliative Care  Procedures:     Antimicrobials:       Subjective: Patient is not responsive to voice or touch, progressive and rapid somnolence over last 12 to 24 H. Not able to eat.   Objective: Vitals:   11/02/18 1306 11/02/18 1900 11/02/18 2209 11-14-2018 0604  BP: 97/62  116/65 122/70  Pulse: (!) 109  68 77  Resp:  18 19 18   Temp: 97.8 F (36.6 C)  97.9 F (36.6 C) 98.1 F (36.7 C)  TempSrc: Oral  Oral Oral  SpO2: 97%  99% 98%  Weight:      Height:        Intake/Output Summary (Last 24 hours) at 11/14/18 1139 Last data filed at 11/14/18 0918 Gross per 24 hour  Intake 836.17 ml  Output -  Net 836.17 ml   Filed Weights   10/28/18 0611 10/29/18 0630 11/02/18 0500  Weight: 95.5 kg 93.1 kg 91.6 kg    Examination:   General: positive distress, agonal breathing Neurology: Awake and alert, non focal  E ENT: positive pallor, no icterus, oral mucosa moist Cardiovascular: No JVD. S1-S2 present, rhythmic, no gallops, rubs, or murmurs. No lower extremity edema. Pulmonary:  Decreased breath sounds bilaterally, no wheezing, scattered bilateral rhonchi with rales. Gastrointestinal. Abdomen with, no organomegaly, non tender, no rebound or guarding Skin. No rashes Musculoskeletal: no joint deformities     Data Reviewed: I have personally reviewed following labs and imaging studies  CBC: Recent Labs  Lab 10/29/18 0556 11/01/18 0551  WBC 14.5* 14.1*  NEUTROABS 11.9* 11.5*  HGB 9.7* 9.3*  HCT 32.2* 30.2*  MCV 87.5 86.3  PLT 296 017   Basic Metabolic Panel: Recent Labs  Lab 10/28/18 0251 10/29/18 0556 11/02/18 0557  NA  --  141 140  K  --  4.3 4.5  CL  --  104 103  CO2  --  30 29  GLUCOSE  --  123* 137*  BUN  --  18 21  CREATININE 0.88 0.87 1.09  CALCIUM  --  10.9* 10.7*   GFR: Estimated Creatinine Clearance: 64.6 mL/min (by C-G formula based on SCr of 1.09 mg/dL). Liver  Function Tests: No results for input(s): AST, ALT, ALKPHOS, BILITOT, PROT, ALBUMIN in the last 168 hours. No results for input(s): LIPASE, AMYLASE in the last 168 hours. No results for input(s): AMMONIA in the last 168 hours. Coagulation Profile: No results for input(s): INR, PROTIME in the last 168 hours. Cardiac Enzymes: No results for input(s): CKTOTAL, CKMB, CKMBINDEX, TROPONINI in the last 168 hours. BNP (last 3 results) No results for input(s): PROBNP in the last 8760 hours. HbA1C: No results for input(s): HGBA1C in the last 72 hours. CBG: Recent Labs  Lab 10/28/18 1633  GLUCAP 112*   Lipid Profile: No results for input(s): CHOL, HDL, LDLCALC, TRIG, CHOLHDL, LDLDIRECT in the last 72 hours. Thyroid Function Tests: No results for input(s): TSH, T4TOTAL, FREET4, T3FREE, THYROIDAB in the last 72 hours. Anemia Panel: No results for input(s): VITAMINB12, FOLATE, FERRITIN, TIBC, IRON, RETICCTPCT in the last 72 hours.    Radiology Studies: I have reviewed all of the imaging during this hospital visit personally     Scheduled Meds: . allopurinol  200 mg Oral Daily  . diltiazem  120 mg Oral Daily  . latanoprost  1 drop Both Eyes QHS  . mirtazapine  15 mg  Oral QHS  . multivitamin with minerals  1 tablet Oral Daily  . sodium chloride flush  10-40 mL Intracatheter Q12H   Continuous Infusions: . dextrose 5% lactated ringers 50 mL/hr at November 04, 2018 0918     LOS: 10 days        Mauricio Gerome Apley, MD

## 2018-11-15 DEATH — deceased

## 2018-12-13 ENCOUNTER — Ambulatory Visit: Payer: Medicare Other | Admitting: Urology

## 2018-12-16 NOTE — Progress Notes (Signed)
  Radiation Oncology         [phone] ________________________________  Name: Roger Blackwell MRN: [id number]  Date: 11-25-18  DOB: 05-28-1935  End of Treatment Note  Diagnosis:   38 gentleman with hemoptysis from squamous cell carcinoma of the right lower lung     Indication for treatment:  Palliation of hemoptysis       Radiation treatment dates:   6/04-2019-07-13  Site/dose:   The right lower lung mass was treated to 21 Gy in 7 fractions of 3 Gy, out of a planned course of 30 Gy which was not completed  Beams/energy:   3D with combo of 6, 10 and 15 X fields  Narrative: The patient tolerated radiation treatment, but, unfortunately his overall condition worsened and he expired on November 25, 2018.  Plan: The patient did not complete treatment, and died of lung cancer.  ________________________________  Sheral Apley. Tammi Klippel, M.D.

## 2019-01-11 ENCOUNTER — Ambulatory Visit: Payer: Self-pay | Admitting: Urology

## 2019-07-03 IMAGING — DX DG CHEST 2V
2 series · 2 of 2 positions shown · non-contrast
Comparison: 05/23/2018

CLINICAL DATA: Follow-up necrotizing pneumonia

EXAM:
CHEST - 2 VIEW

[chest pa]
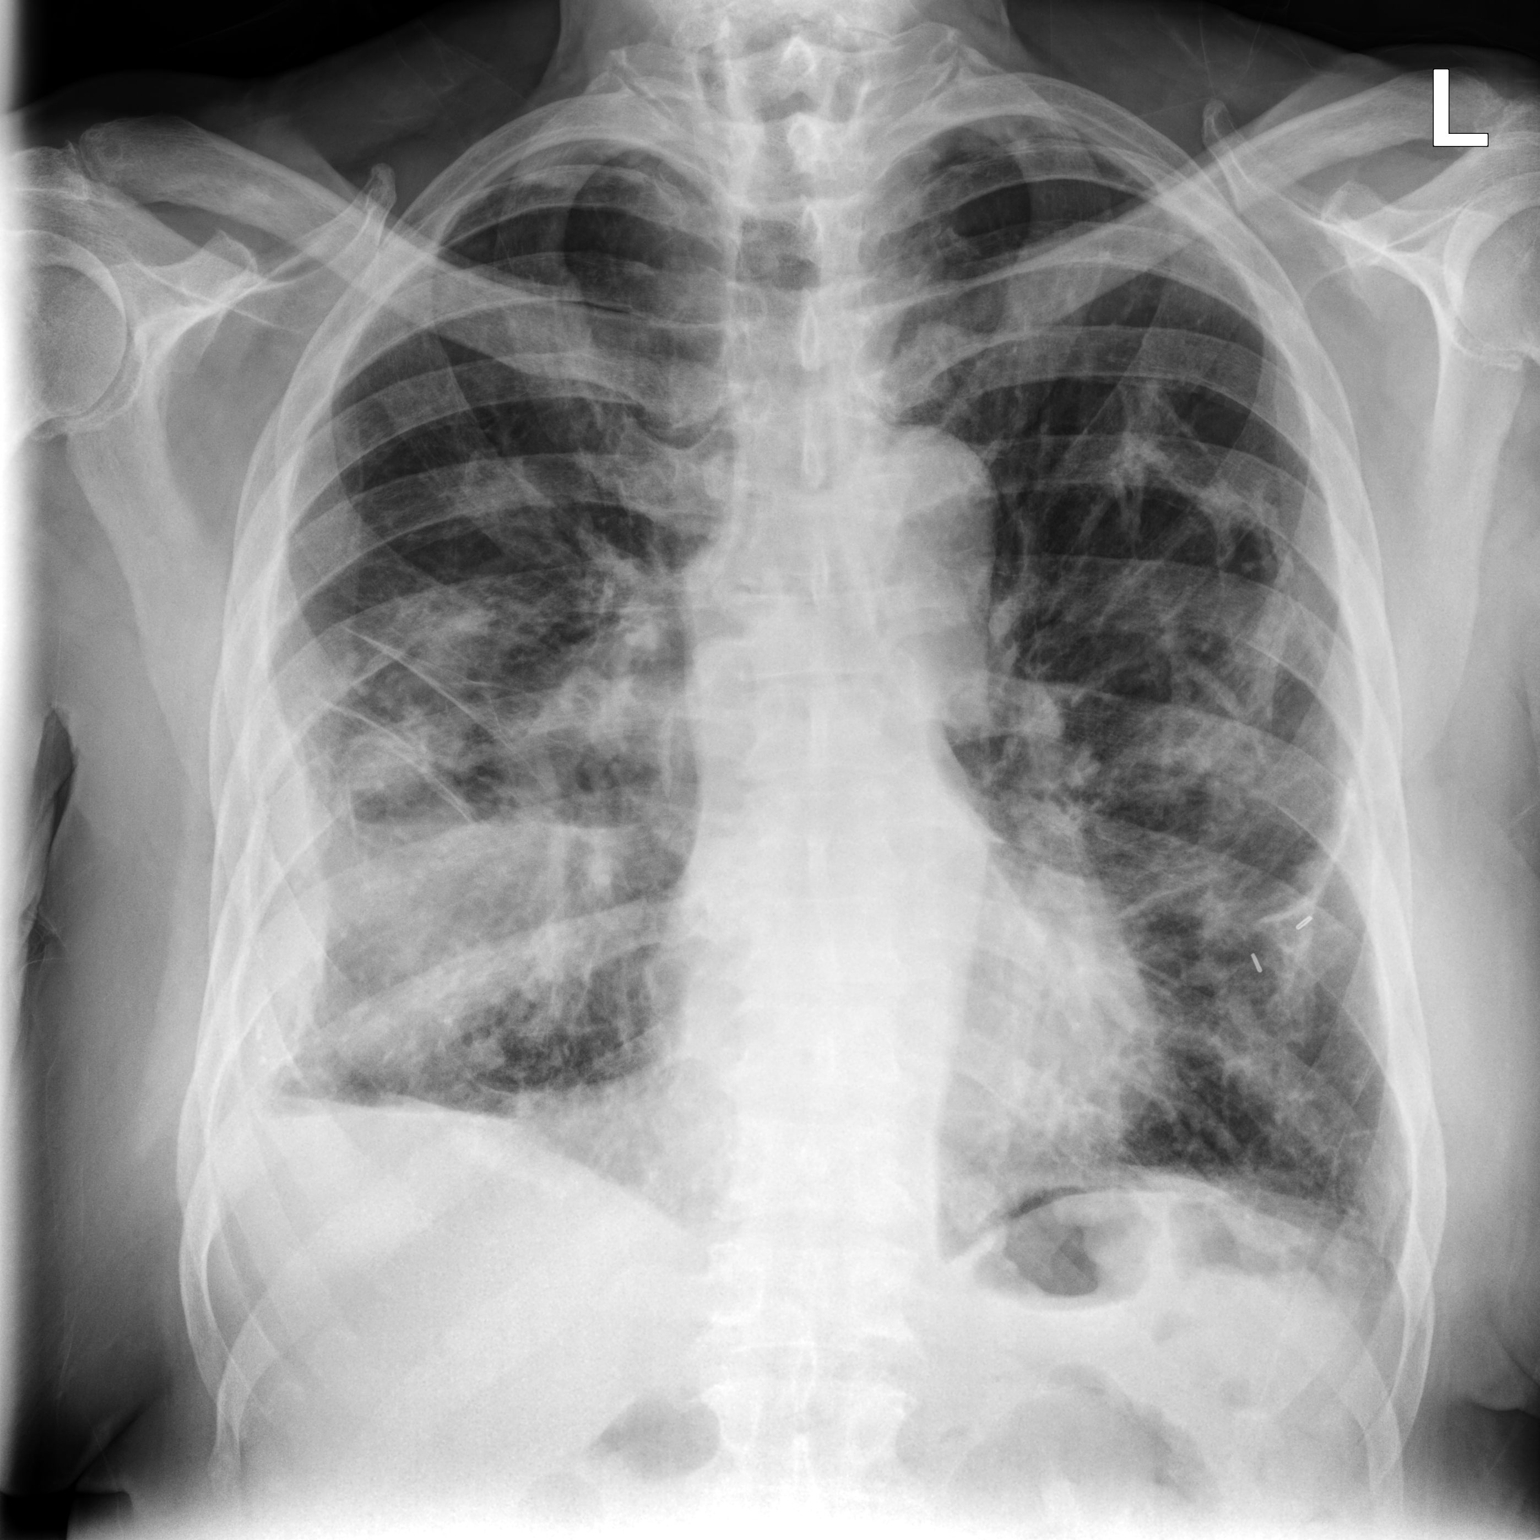

[chest lat]
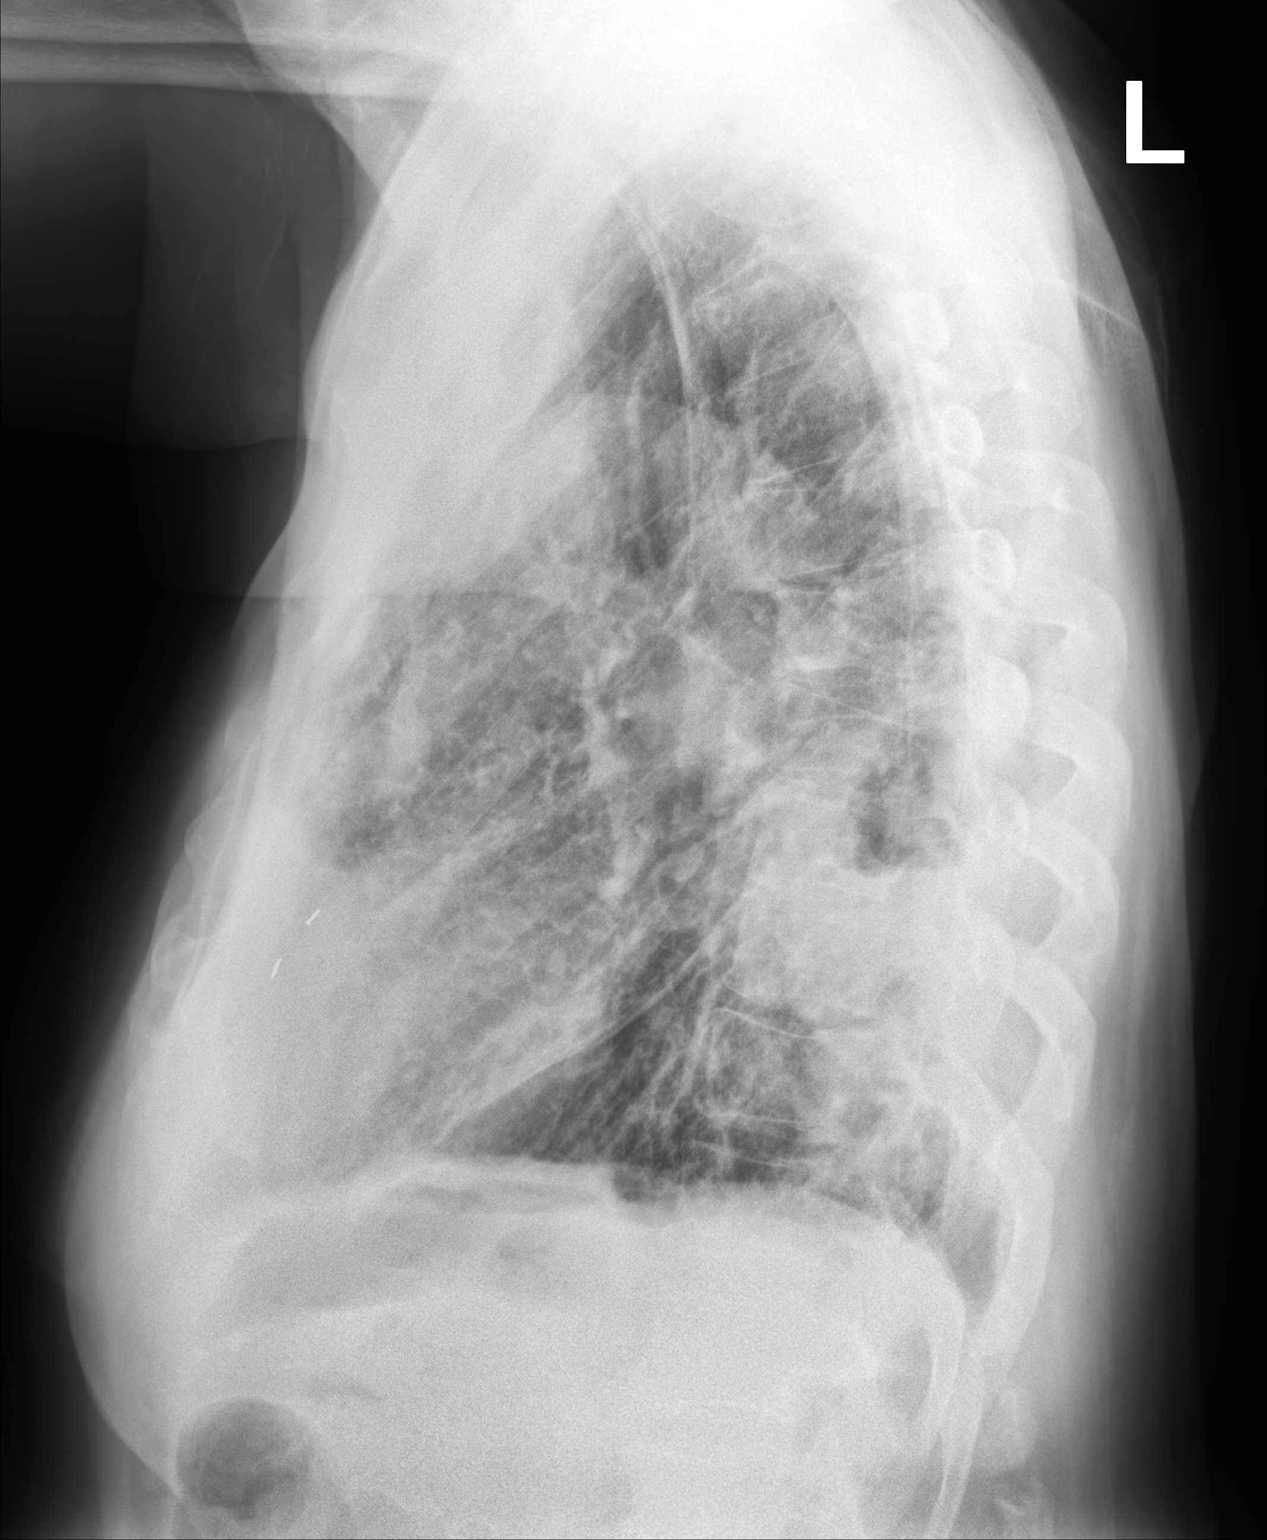

[2 of 2 positions shown; findings below may reference images not displayed]

FINDINGS: Airspace disease again noted in the right lower lobe. Air-fluid
level is now noted. This is concerning for cavitation or abscess
formation. Right pleural thickening and/or pleural effusion again
noted. Calcified pleural plaques. Heart is normal size.
IMPRESSION: Continued masslike airspace opacity in the right lower lobe. There
now is an air-fluid level noted paddle with cavitation or abscess
formation.

Right pleural thickening and/or effusion, unchanged.

## 2019-10-16 IMAGING — DX PORTABLE CHEST - 1 VIEW
1 series · 1 of 1 positions shown · non-contrast
Comparison: Radiographs and CT 09/02/2018.

CLINICAL DATA: Atrial fibrillation with rapid ventricular response
and hypotension. History of squamous cell lung cancer.

EXAM:
PORTABLE CHEST 1 VIEW

[chest ap]
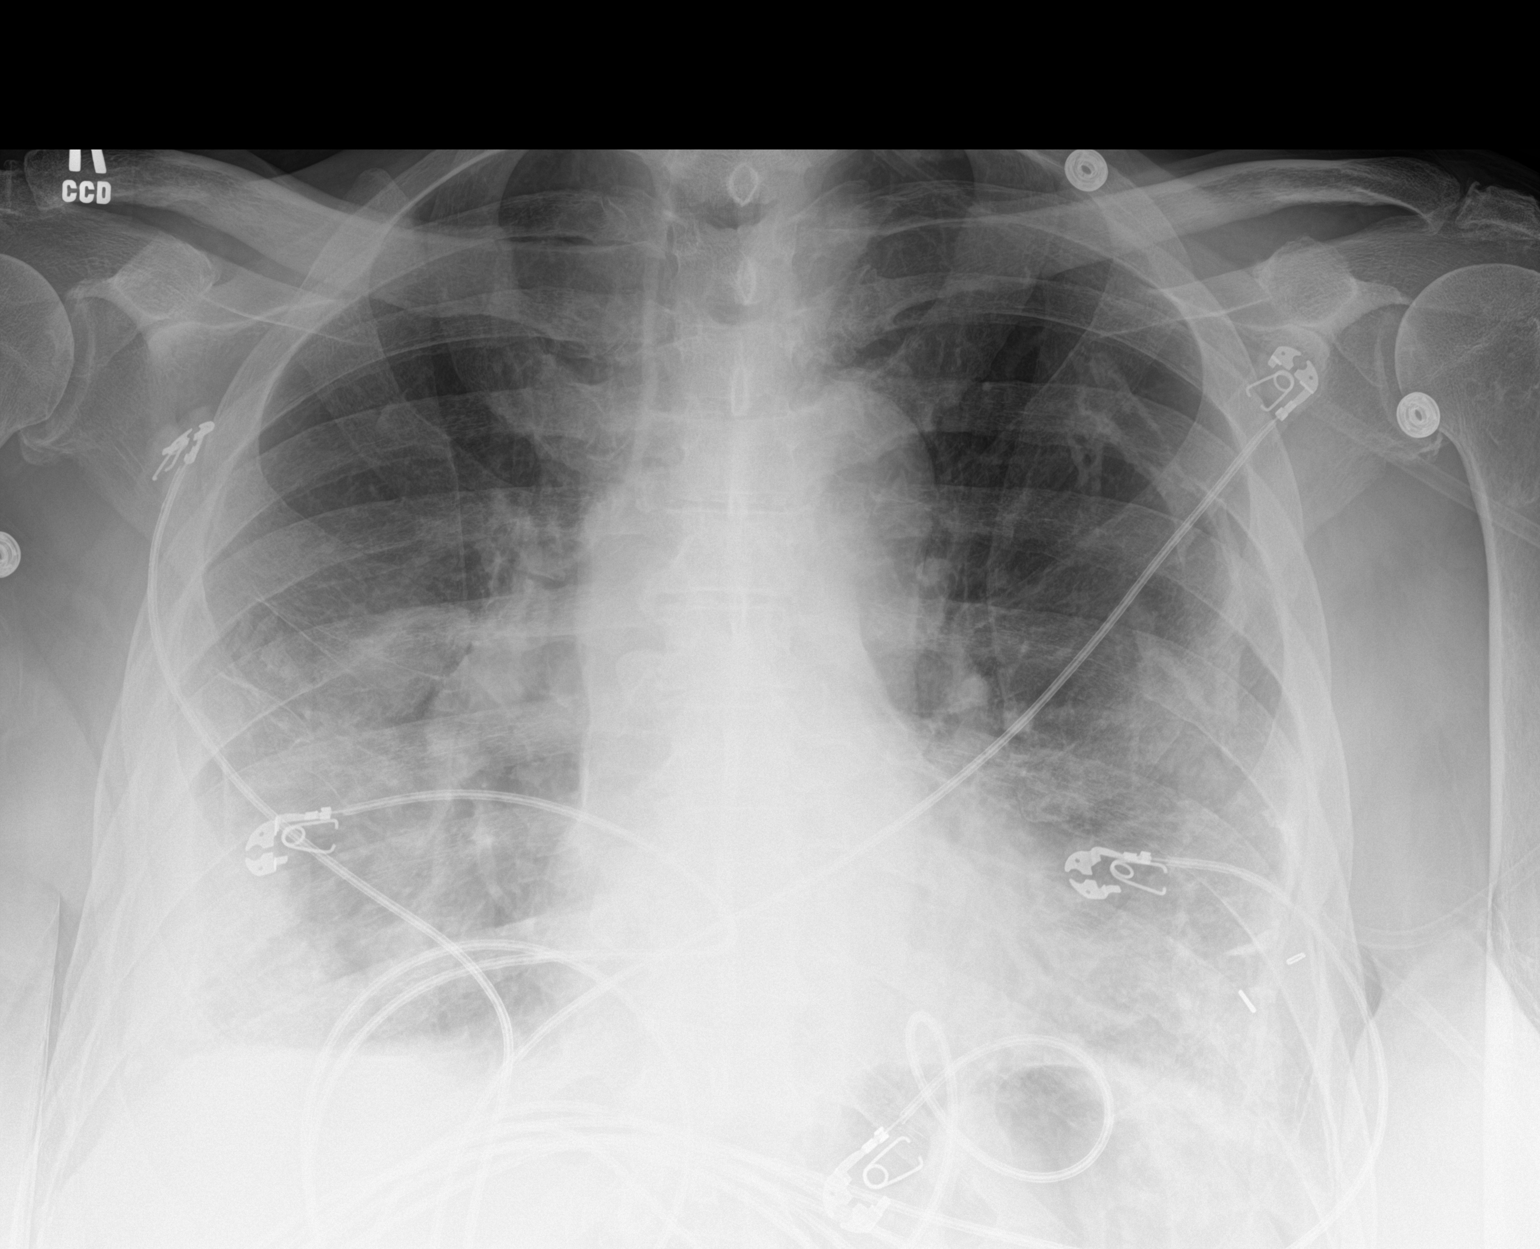

[1 of 1 positions shown; findings below may reference images not displayed]

FINDINGS: 8907 hours. The heart size and mediastinal contours are grossly
stable with persistent right hilar prominence. The known cavitary
lesion posteriorly in the right hemithorax is partly obscured by
pleuroparenchymal opacity. Pleural calcifications are present
bilaterally. Overall appearance is similar to the prior radiographs,
and no superimposed airspace disease or pneumothorax identified. The
bones appear unchanged.
IMPRESSION: No significant change is seen from the prior studies of 1 month ago.
No acute superimposed process identified.

## 2019-11-17 IMAGING — CT CT HEAD WITHOUT CONTRAST
3 series · 15 of 47 positions shown, 18 images · non-contrast
Comparison: Prior brain MRI and CT from 10/19/2018.

CLINICAL DATA: Initial evaluation for acute headache, fatigue,
lethargy.

EXAM:
CT HEAD WITHOUT CONTRAST
TECHNIQUE: Contiguous axial images were obtained from the base of the skull
through the vertex without intravenous contrast.

[Series 2: head wo · axial · 0.47mm/px · z∈[+1493,+1618]mm · 9 of 31 slices shown, 12 images]
[im 3/31  brain]
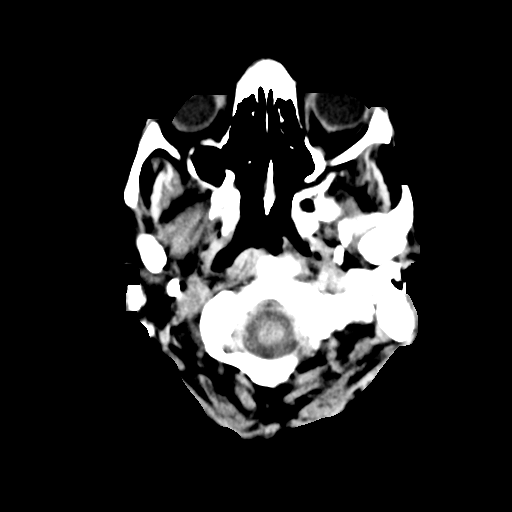
[im 3/31  bone]
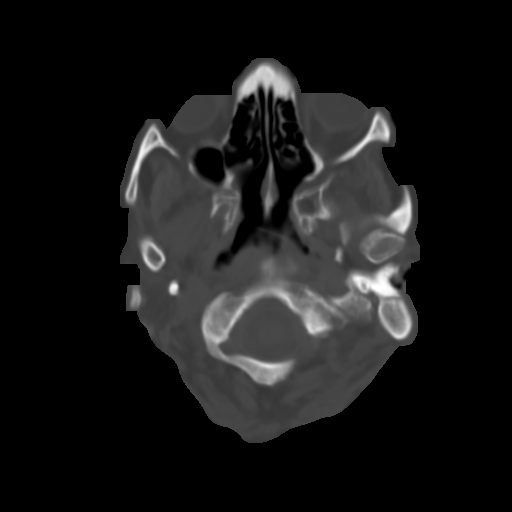
[im 6/31  brain]
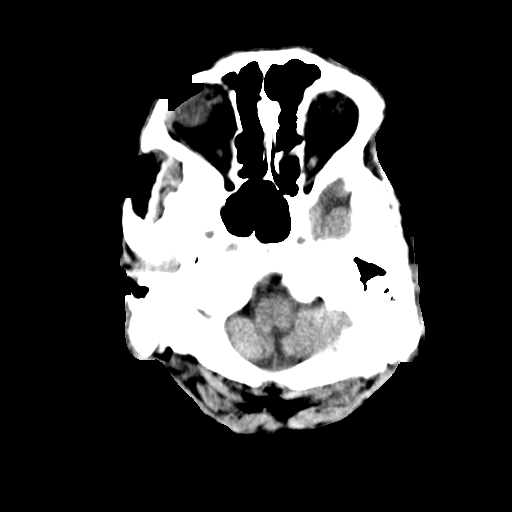
[im 9/31  brain]
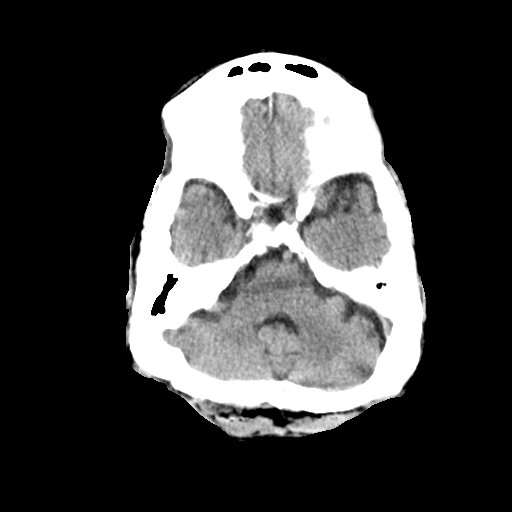
[im 12/31  brain]
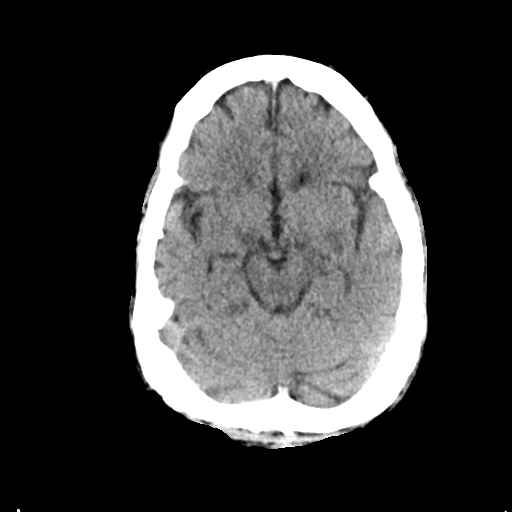
[im 16/31  brain]
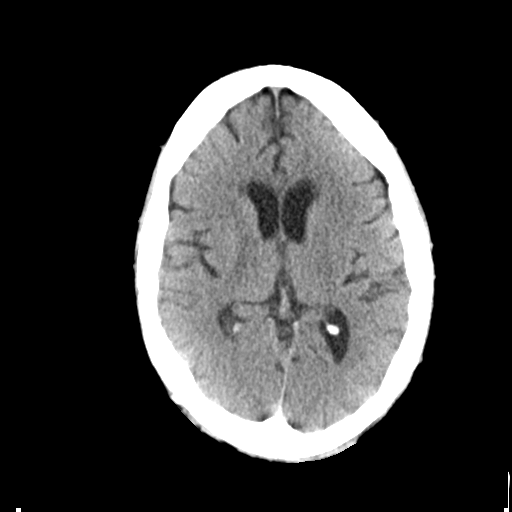
[im 16/31  bone]
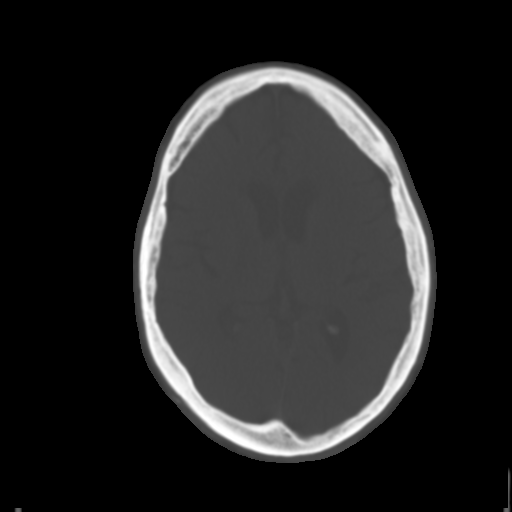
[im 19/31  brain]
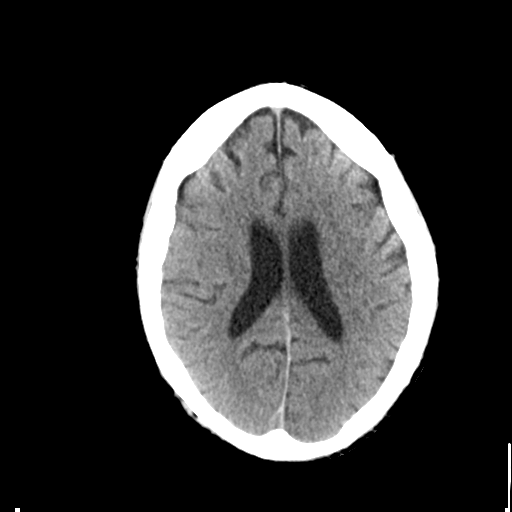
[im 22/31  brain]
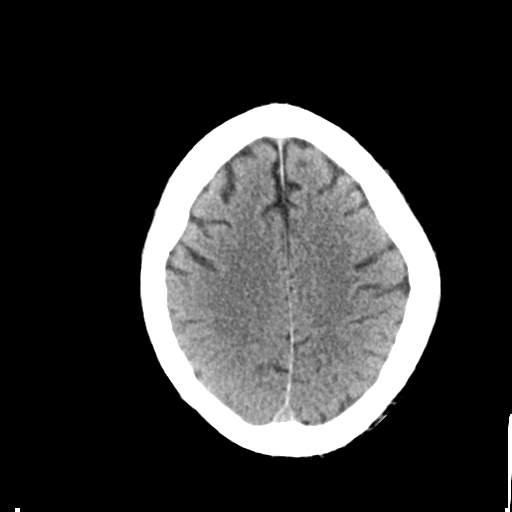
[im 25/31  brain]
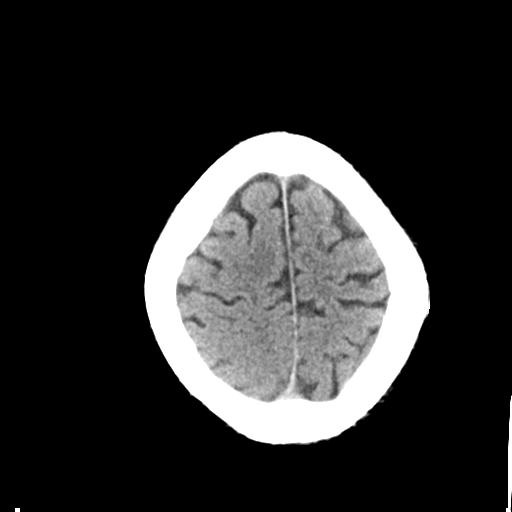
[im 28/31  brain]
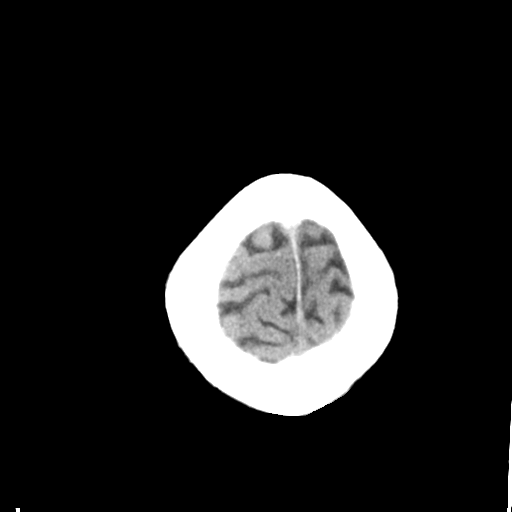
[im 28/31  bone]
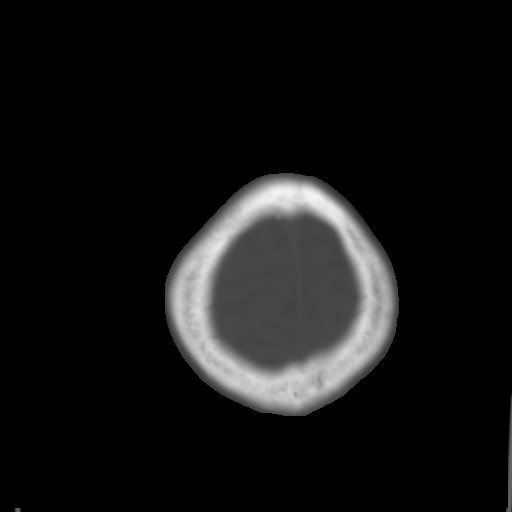

[Series 4: coronal soft tissue · coronal · 0.31mm/px · 3 of 67 slices shown]
[im 23/67  brain]
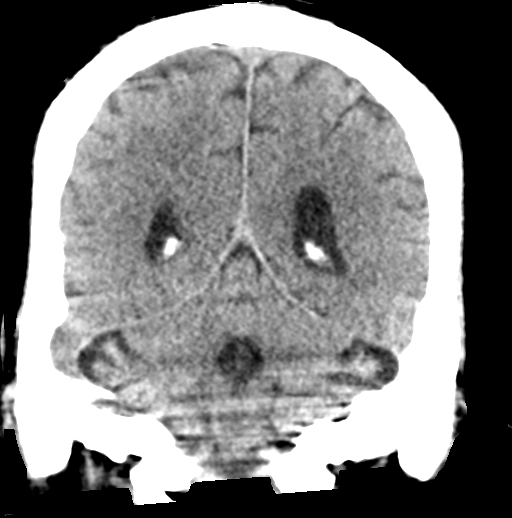
[im 30/67  brain]
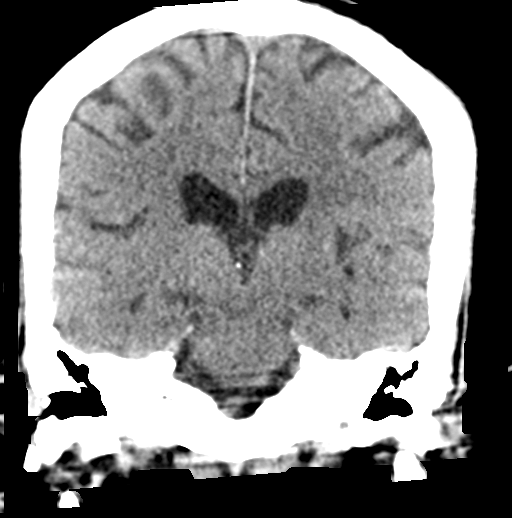
[im 37/67  brain]
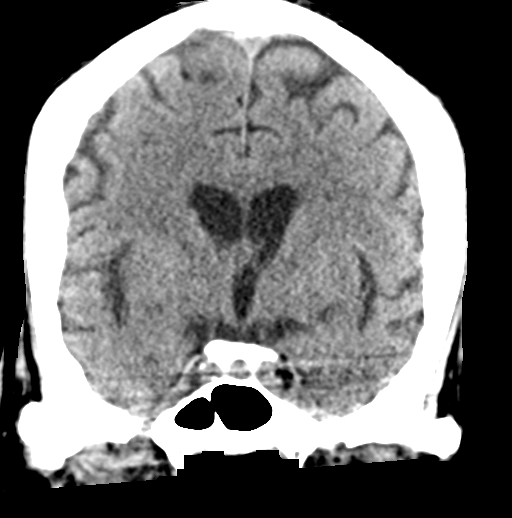

[Series 5: sagittal soft tissue · sagittal · 0.31mm/px · 3 of 51 slices shown]
[im 17/51  brain]
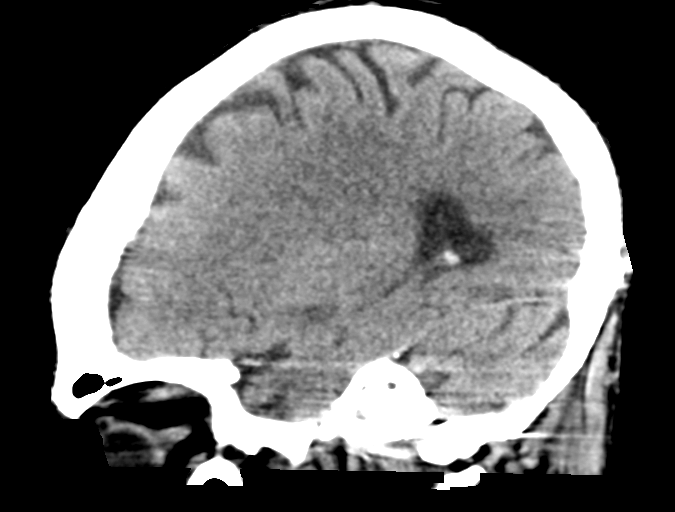
[im 26/51  brain]
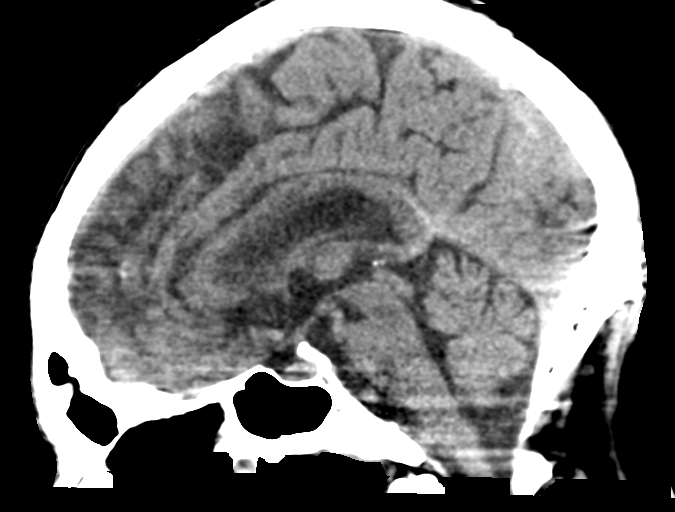
[im 34/51  brain]
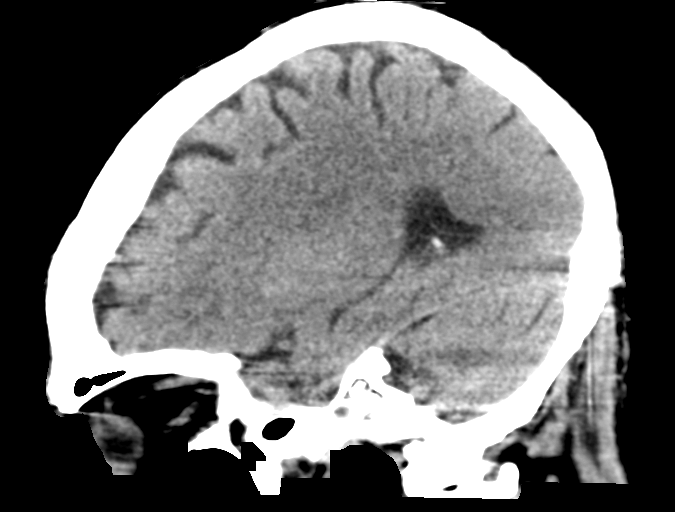

[15 of 47 positions shown; findings below may reference images not displayed]

FINDINGS: Brain: Cerebral volume within normal limits for patient age.

No evidence for acute intracranial hemorrhage. No findings to
suggest acute large vessel territory infarct. No mass lesion,
midline shift, or mass effect. Ventricles are normal in size without
evidence for hydrocephalus. No extra-axial fluid collection
identified.

Vascular: No hyperdense vessel identified.

Skull: Scalp soft tissues demonstrate no acute abnormality.
Calvarium intact.

Sinuses/Orbits: Globes and orbital soft tissues within normal
limits.

Visualized paranasal sinuses are clear. No mastoid effusion.
IMPRESSION: Stable normal head CT for age. No acute intracranial abnormality
identified.
